# Patient Record
Sex: Female | Born: 1980 | Race: Black or African American | Hispanic: No | Marital: Married | Smoking: Never smoker
Health system: Southern US, Community
[De-identification: ages and names within clinical notes are randomized; demographics above are authoritative.]

## PROBLEM LIST (undated history)

## (undated) ENCOUNTER — Inpatient Hospital Stay (HOSPITAL_COMMUNITY): Payer: Self-pay

## (undated) DIAGNOSIS — O24419 Gestational diabetes mellitus in pregnancy, unspecified control: Secondary | ICD-10-CM

## (undated) DIAGNOSIS — R7302 Impaired glucose tolerance (oral): Secondary | ICD-10-CM

## (undated) DIAGNOSIS — I10 Essential (primary) hypertension: Secondary | ICD-10-CM

## (undated) HISTORY — DX: Essential (primary) hypertension: I10

## (undated) HISTORY — DX: Impaired glucose tolerance (oral): R73.02

## (undated) HISTORY — PX: REDUCTION MAMMAPLASTY: SUR839

## (undated) HISTORY — PX: BREAST REDUCTION SURGERY: SHX8

---

## 2009-11-05 ENCOUNTER — Ambulatory Visit: Payer: Self-pay | Admitting: Obstetrics and Gynecology

## 2009-11-05 LAB — CONVERTED CEMR LAB
Hemoglobin: 10.3 g/dL — ABNORMAL LOW (ref 12.0–15.0)
RBC: 3.61 M/uL — ABNORMAL LOW (ref 3.87–5.11)
WBC: 4.7 10*3/uL (ref 4.0–10.5)

## 2009-11-07 ENCOUNTER — Inpatient Hospital Stay (HOSPITAL_COMMUNITY): Admission: AD | Admit: 2009-11-07 | Discharge: 2009-11-07 | Payer: Self-pay | Admitting: Obstetrics and Gynecology

## 2009-11-07 ENCOUNTER — Encounter: Payer: Self-pay | Admitting: Family Medicine

## 2009-11-07 IMAGING — US US OB COMP +14 WK
1 series · 14 of 28 positions shown · non-contrast
Comparison: none

OBSTETRICAL ULTRASOUND:
 This ultrasound exam was performed in the [HOSPITAL] Ultrasound Department.  The OB US report was generated in the AS system, and faxed to the ordering physician.  This report is also available in [HOSPITAL]?s [employer] and in [REDACTED] PACS.

[Series 1: us ob comp +14 wk · 0.37mm/px · 14 of 39 slices shown]
[im 2/39]
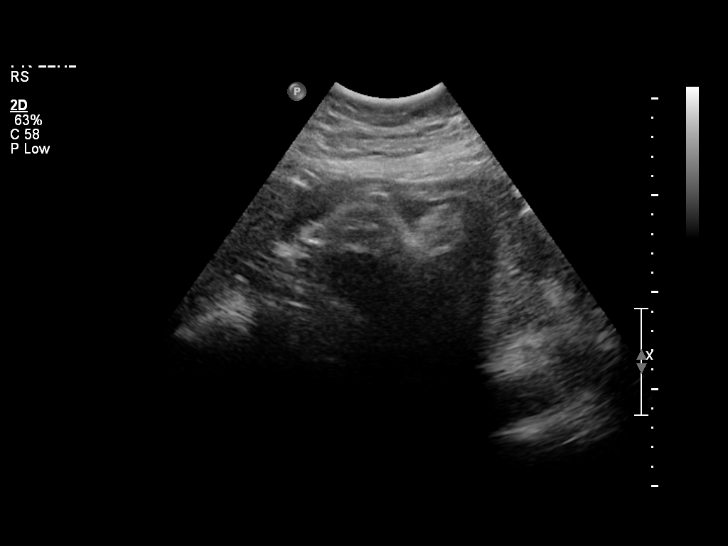
[im 5/39]
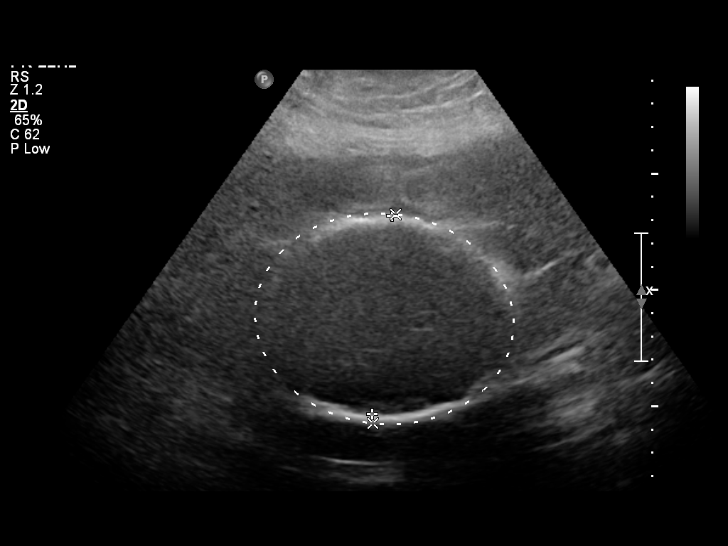
[im 8/39]
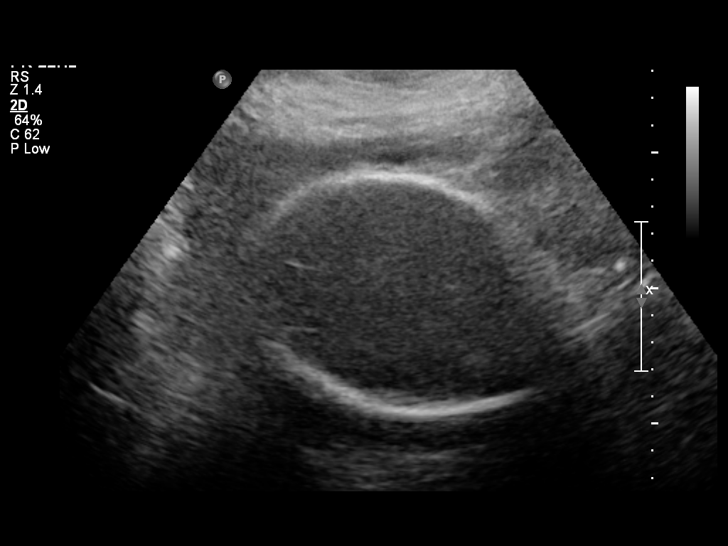
[im 10/39]
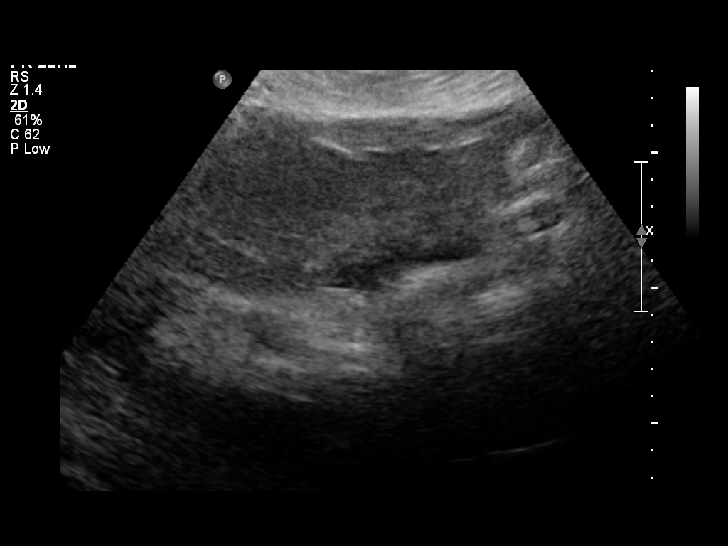
[im 13/39]
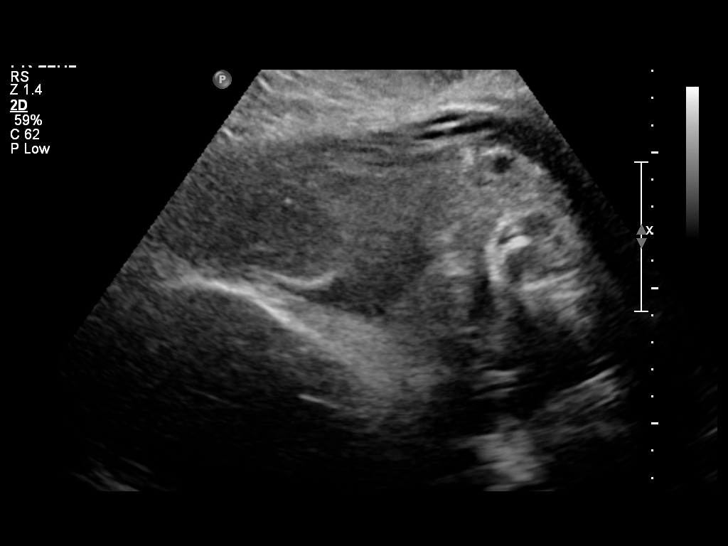
[im 16/39]
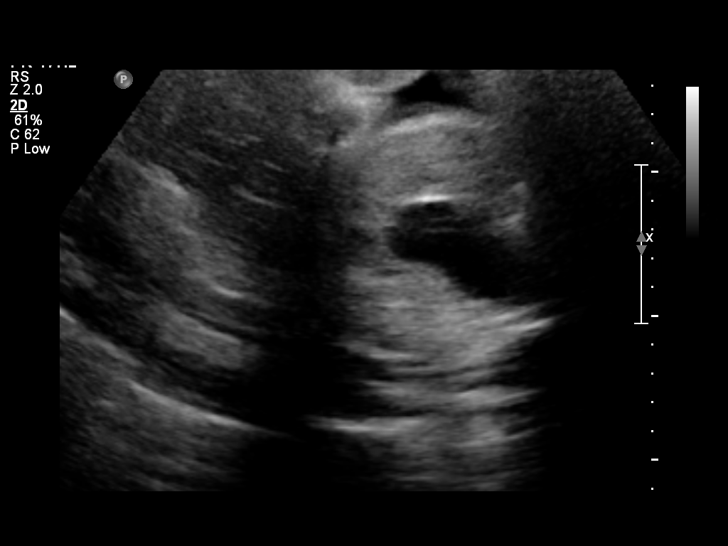
[im 19/39]
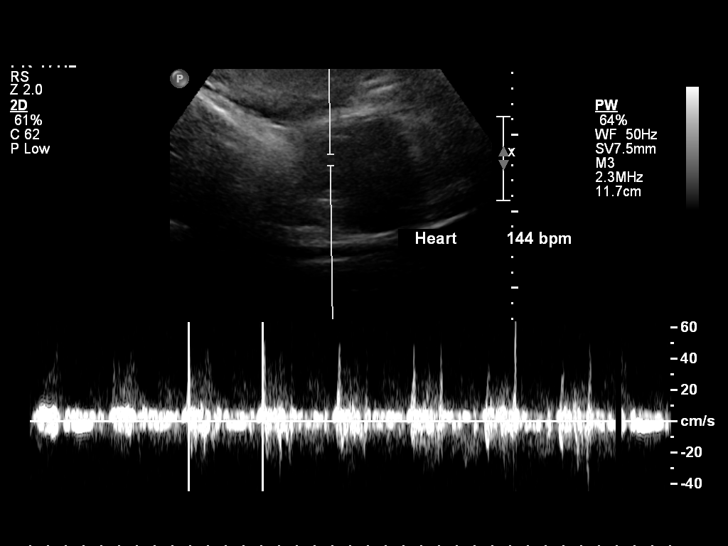
[im 22/39]
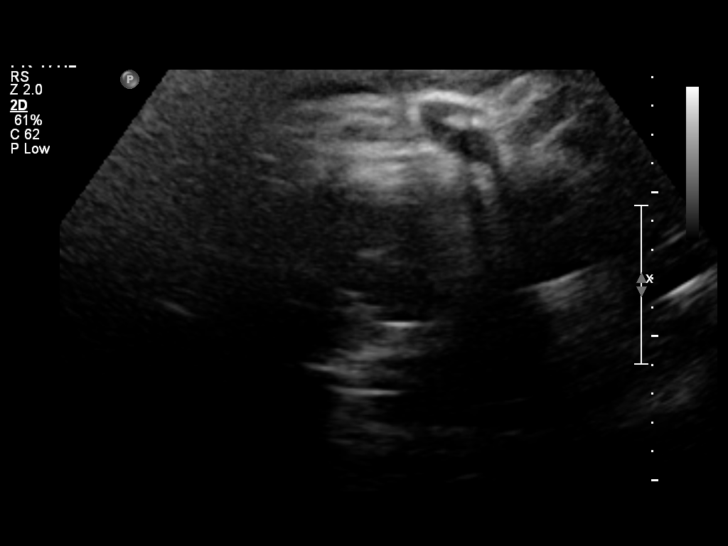
[im 24/39]
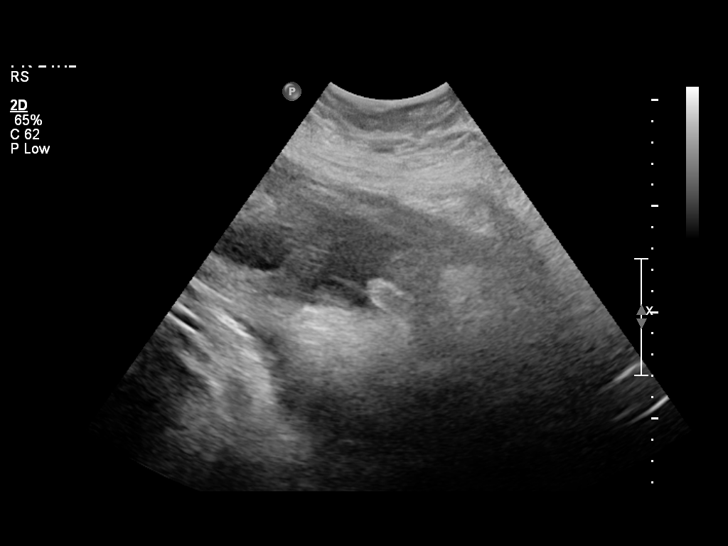
[im 27/39]
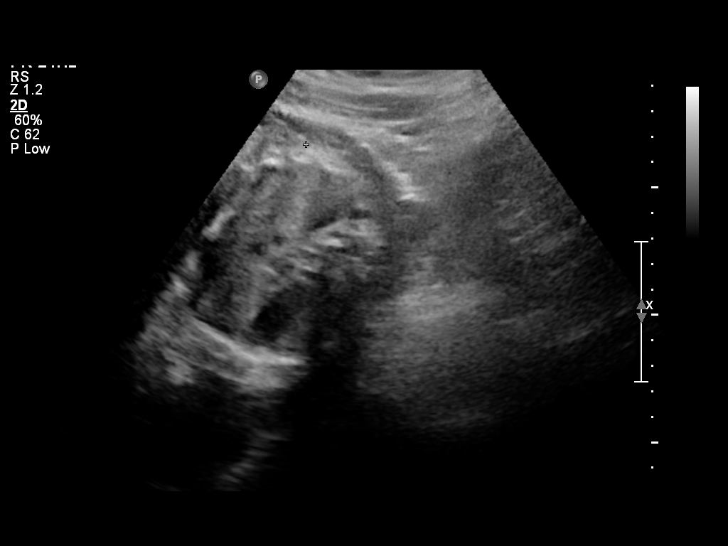
[im 30/39]
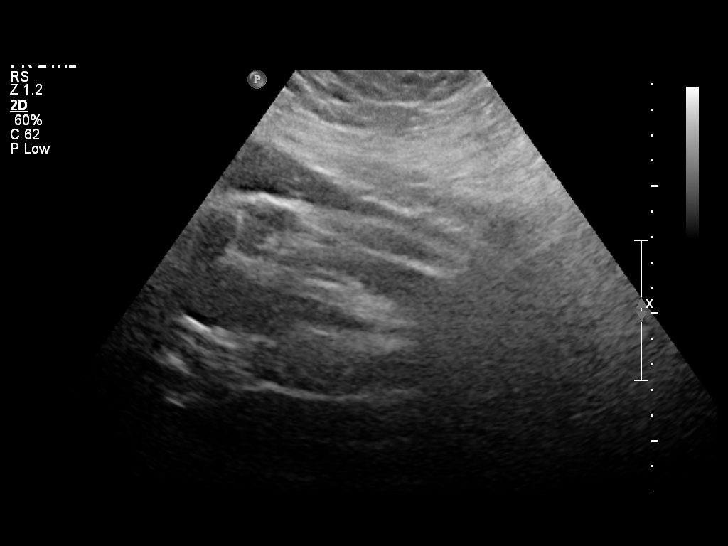
[im 33/39]
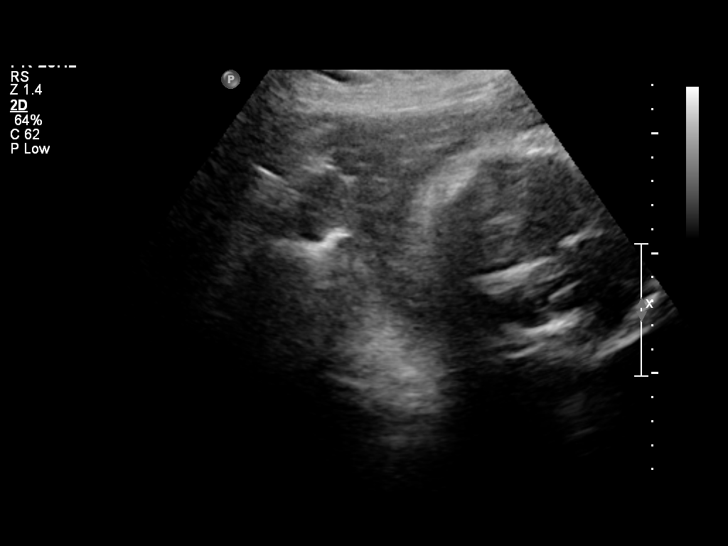
[im 36/39]
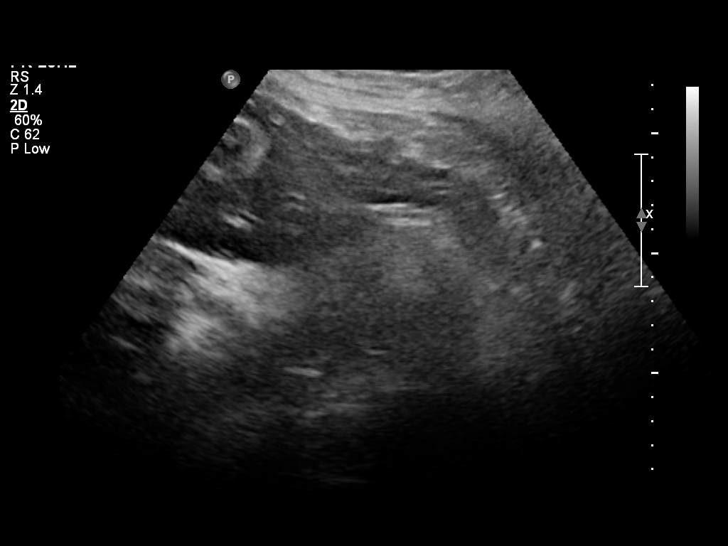
[im 39/39]
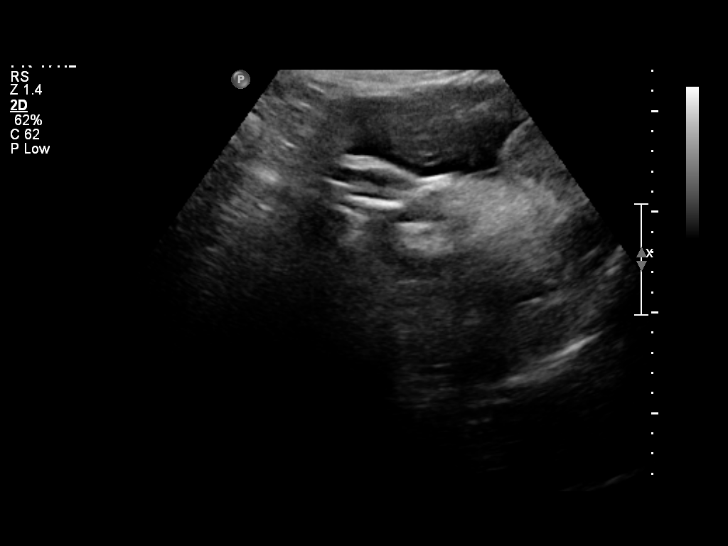

[14 of 28 positions shown; findings below may reference images not displayed]

IMPRESSION: See [facility] Obstetric US report.

## 2009-11-07 IMAGING — US US FETAL BPP W/O NONSTRESS
1 series · 4 of 4 positions shown · non-contrast
Comparison: none

OBSTETRICAL ULTRASOUND:
 This ultrasound exam was performed in the [HOSPITAL] Ultrasound Department.  The OB US report was generated in the AS system, and faxed to the ordering physician.  This report is also available in [HOSPITAL]?s [employer] and in [REDACTED] PACS.

[Series 1: us fetal bpp w/o nonstress · non-contrast · 4 acquisitions, 4 frames shown]
[im 1/4]
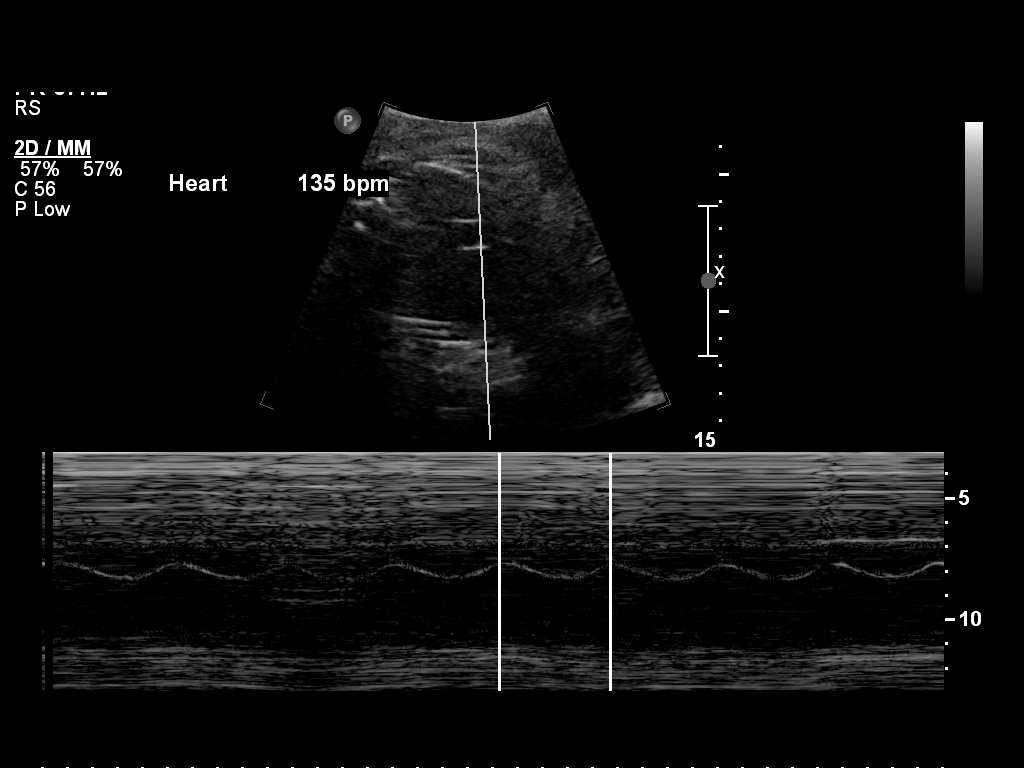
[im 2/4]
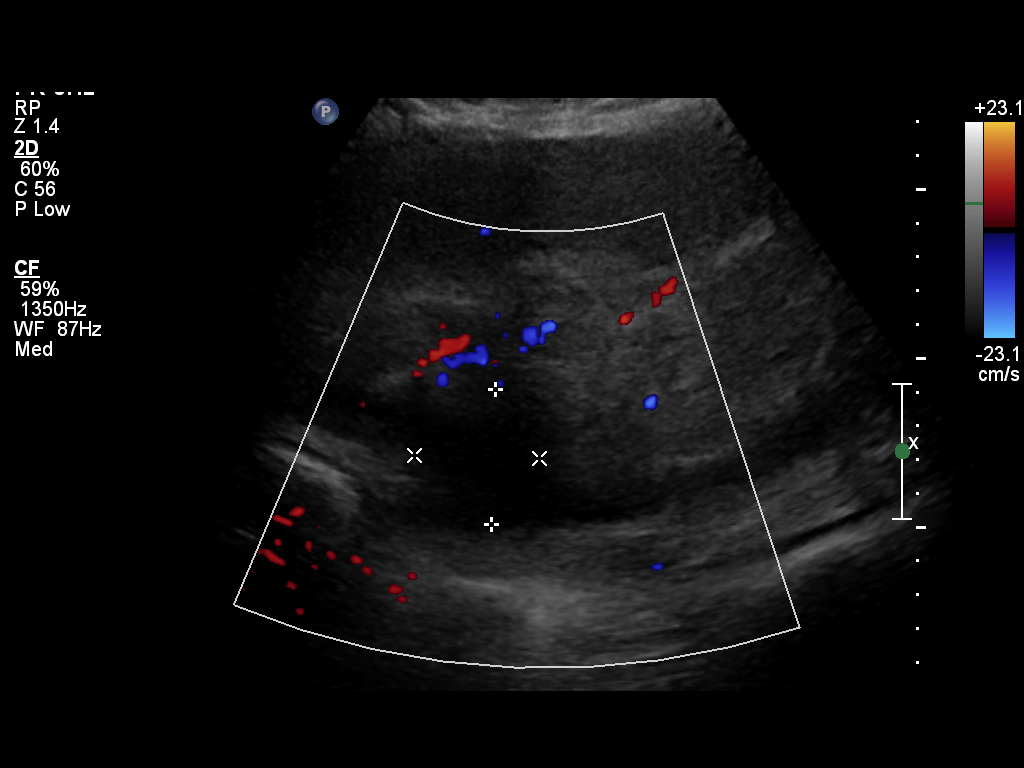
[im 3/4]
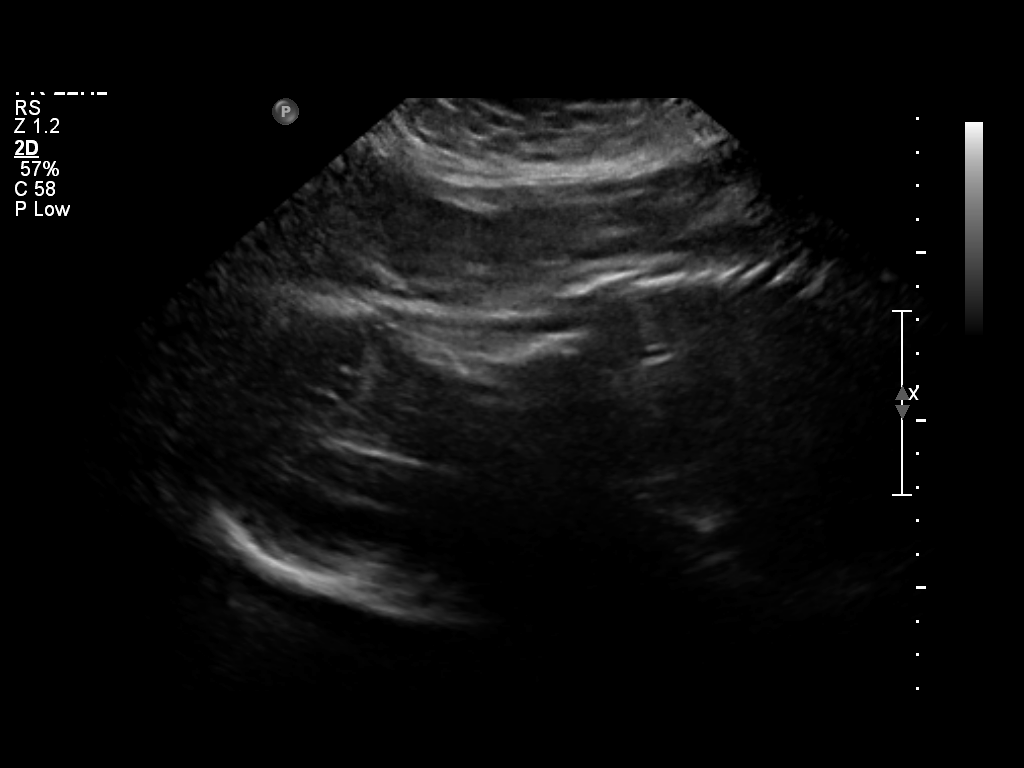
[im 4/4]
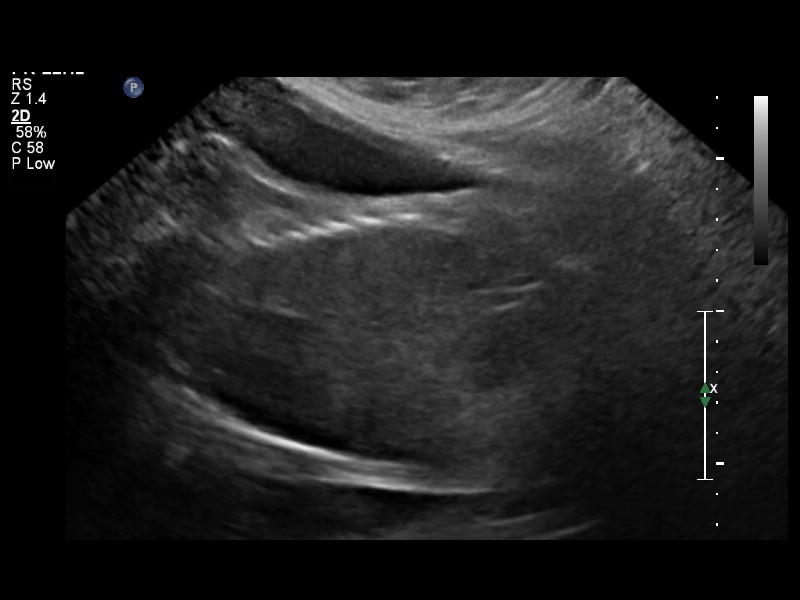

[4 of 4 positions shown; findings below may reference images not displayed]

IMPRESSION: See AS Obstetric US report.

## 2009-11-08 ENCOUNTER — Ambulatory Visit: Payer: Self-pay | Admitting: Advanced Practice Midwife

## 2009-11-08 ENCOUNTER — Encounter: Payer: Self-pay | Admitting: Obstetrics & Gynecology

## 2009-11-08 ENCOUNTER — Inpatient Hospital Stay (HOSPITAL_COMMUNITY): Admission: AD | Admit: 2009-11-08 | Discharge: 2009-11-09 | Payer: Self-pay | Admitting: Obstetrics and Gynecology

## 2009-11-08 LAB — CONVERTED CEMR LAB
Creatinine 24 HR UR: 1743 mg/24hr (ref 700–1800)
Creatinine Clearance: 247 mL/min — ABNORMAL HIGH (ref 75–115)

## 2009-11-10 ENCOUNTER — Ambulatory Visit: Payer: Self-pay | Admitting: Obstetrics & Gynecology

## 2009-11-13 ENCOUNTER — Inpatient Hospital Stay (HOSPITAL_COMMUNITY): Admission: AD | Admit: 2009-11-13 | Discharge: 2009-11-18 | Payer: Self-pay | Admitting: Obstetrics & Gynecology

## 2009-11-13 ENCOUNTER — Ambulatory Visit: Payer: Self-pay | Admitting: Obstetrics & Gynecology

## 2009-11-13 ENCOUNTER — Ambulatory Visit: Payer: Self-pay | Admitting: Obstetrics and Gynecology

## 2009-11-13 IMAGING — US US OB LIMITED
1 series · 14 of 20 positions shown · non-contrast
Comparison: none

OBSTETRICAL ULTRASOUND:
 This ultrasound exam was performed in the [HOSPITAL] Ultrasound Department.  The OB US report was generated in the AS system, and faxed to the ordering physician.  This report is also available in [HOSPITAL]?s [employer] and in [REDACTED] PACS.

[Series 1: us fetal bpp w/o nonstress · non-contrast · 0.33mm/px · 20 acquisitions, 14 frames shown]
[im 1/20]
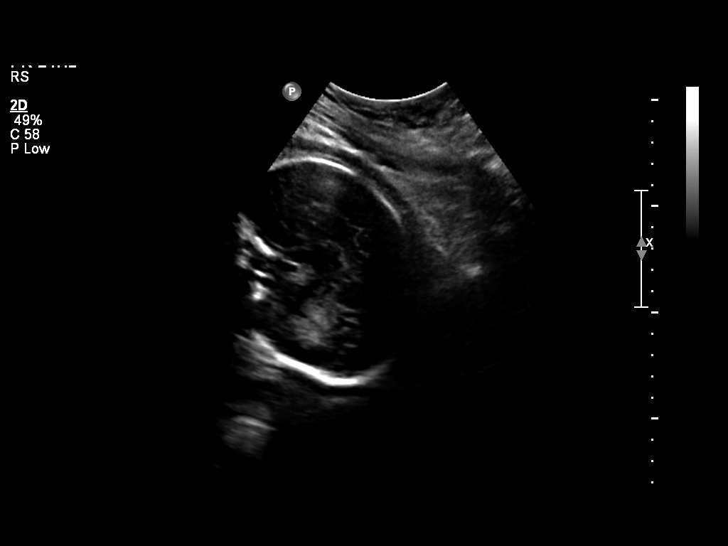
[im 3/20]
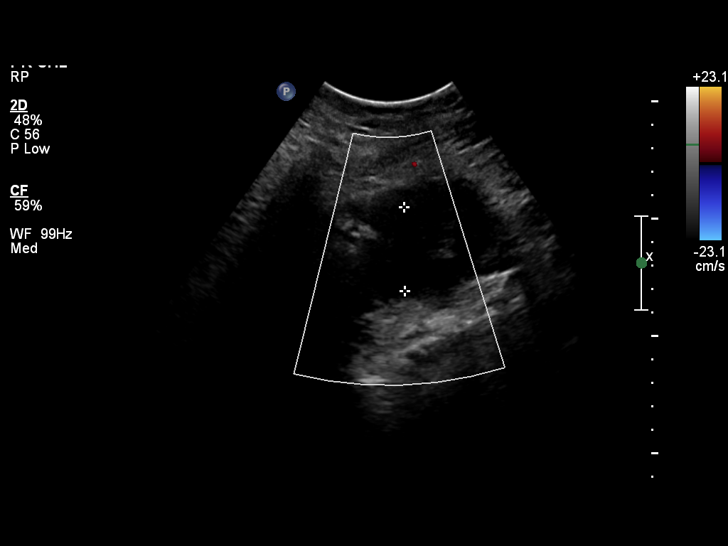
[im 4/20]
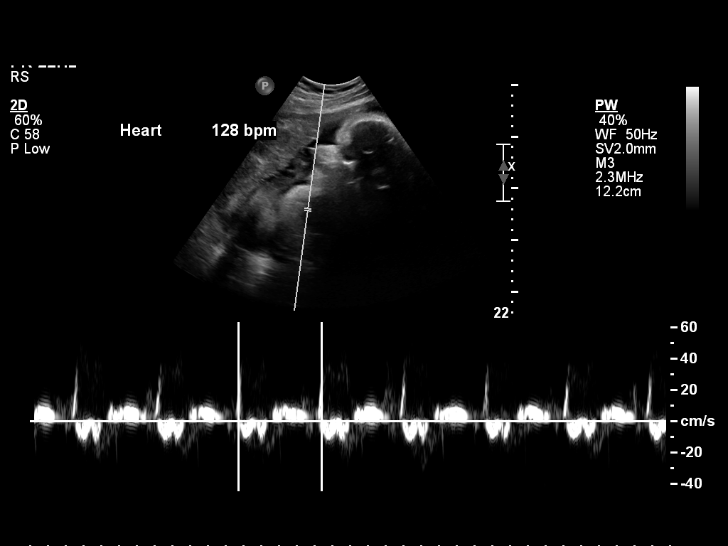
[im 6/20]
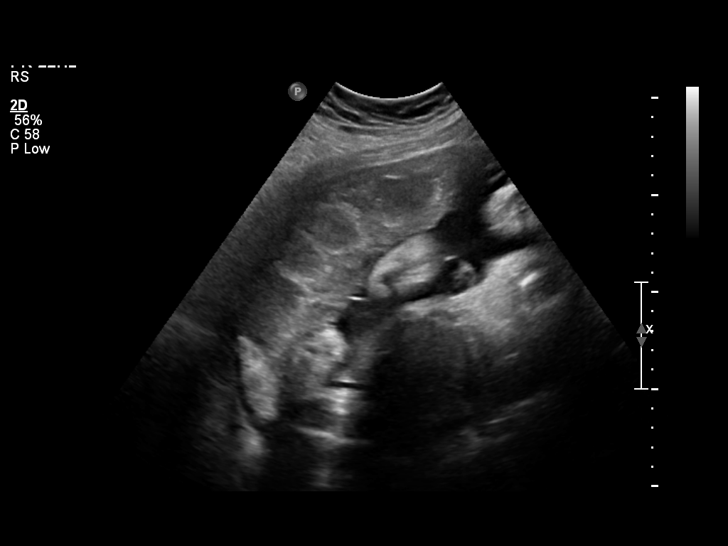
[im 7/20]
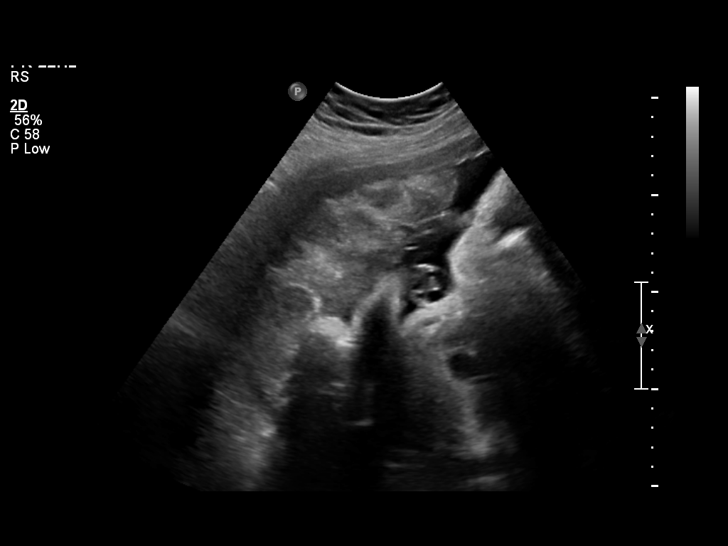
[im 8/20]
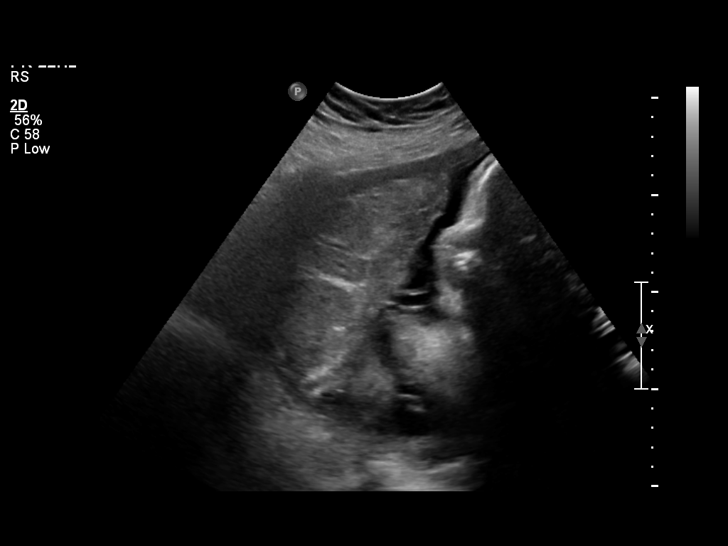
[im 10/20]
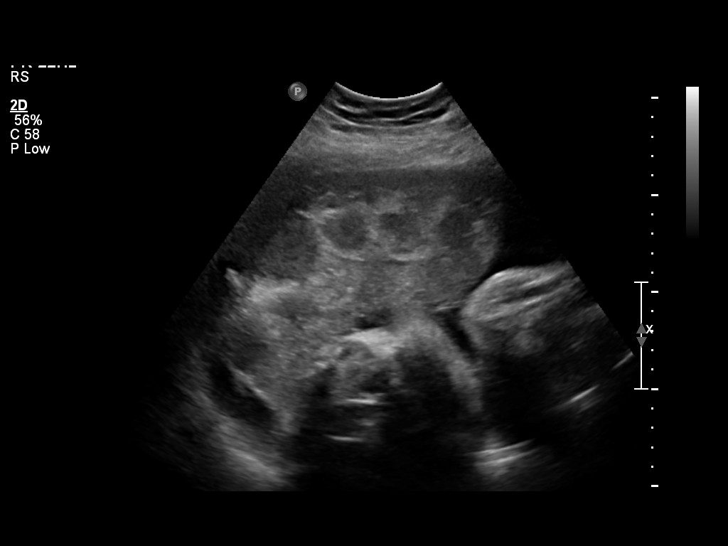
[im 11/20]
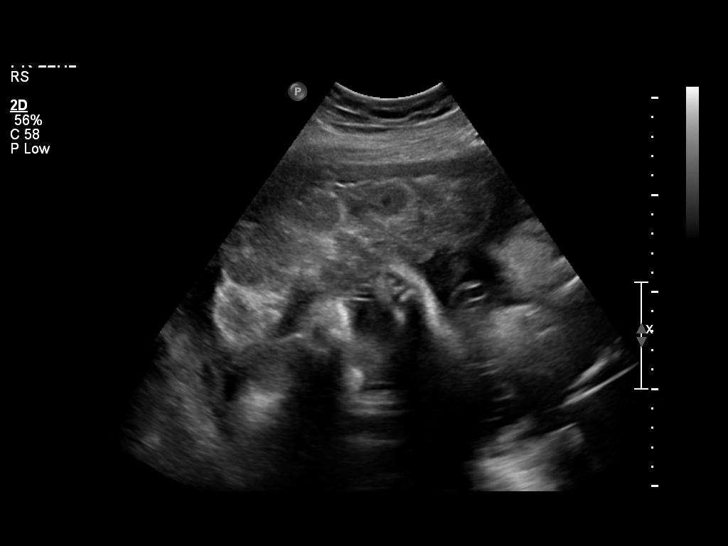
[im 13/20]
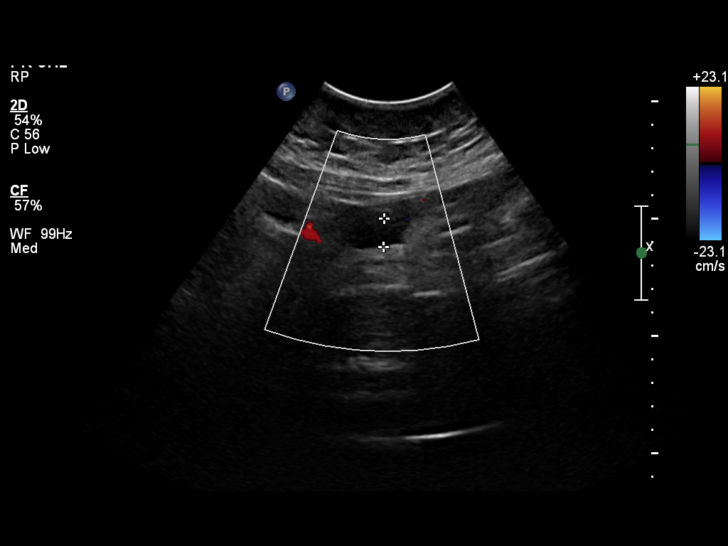
[im 14/20]
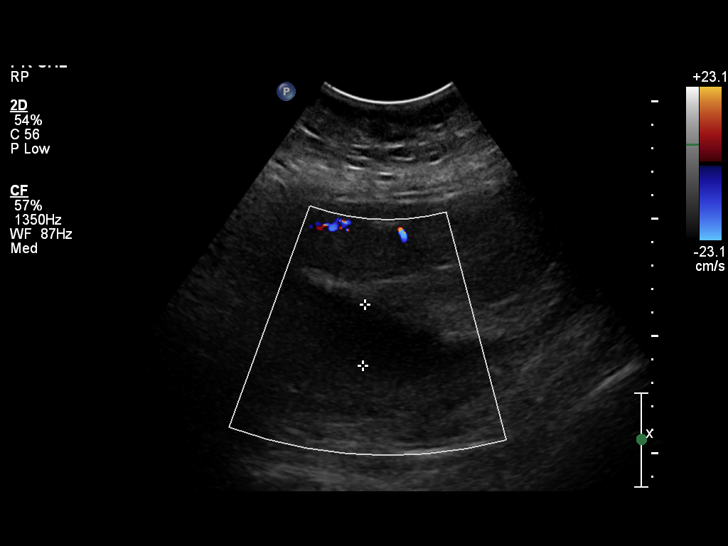
[im 16/20]
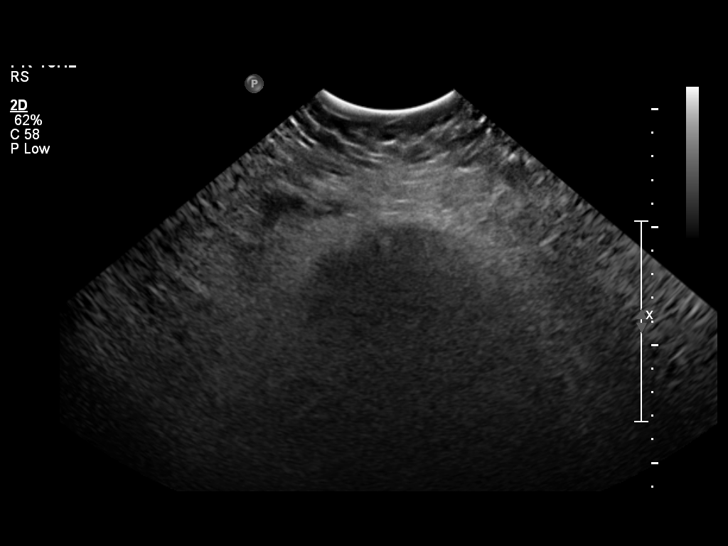
[im 17/20]
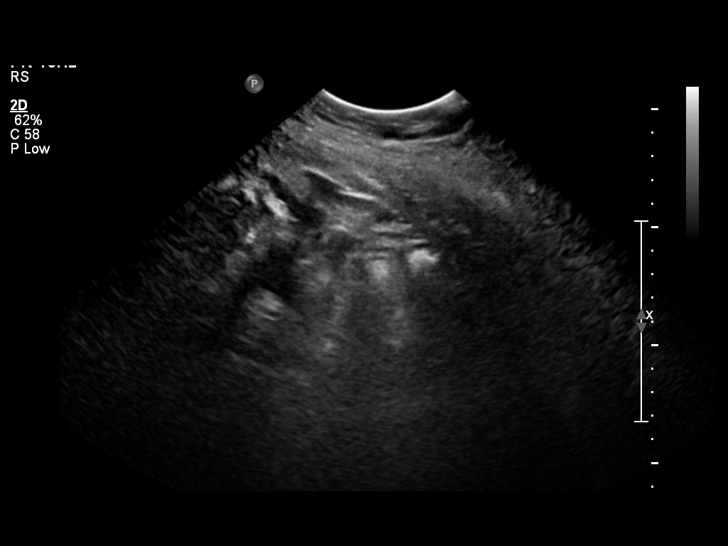
[im 18/20]
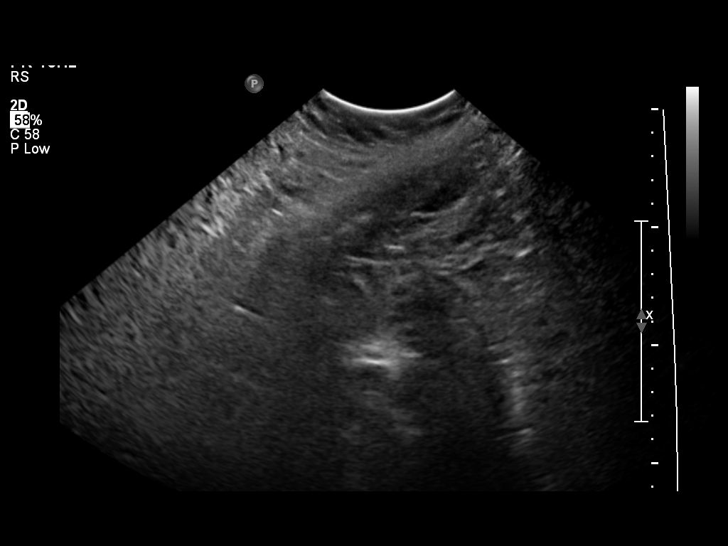
[im 20/20]
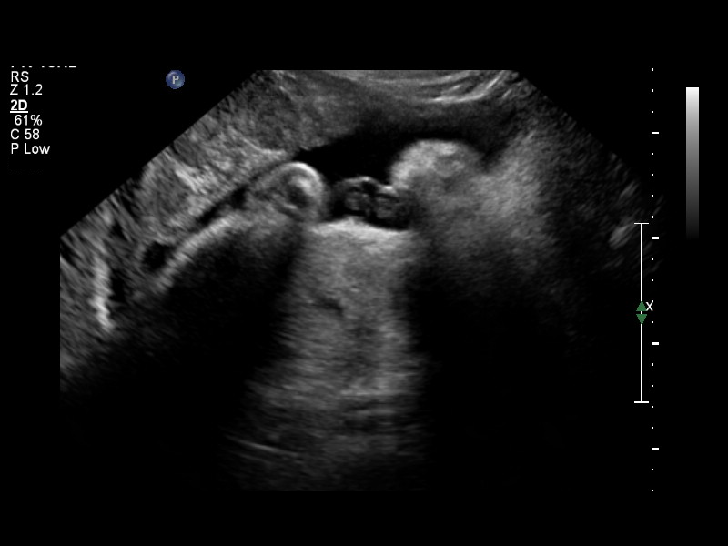

[14 of 20 positions shown; findings below may reference images not displayed]

IMPRESSION: See AS Obstetric US report.

## 2009-11-15 ENCOUNTER — Encounter: Payer: Self-pay | Admitting: Obstetrics & Gynecology

## 2009-11-15 DIAGNOSIS — O1413 Severe pre-eclampsia, third trimester: Secondary | ICD-10-CM

## 2009-11-19 ENCOUNTER — Inpatient Hospital Stay (HOSPITAL_COMMUNITY): Admission: AD | Admit: 2009-11-19 | Discharge: 2009-11-19 | Payer: Self-pay | Admitting: Obstetrics and Gynecology

## 2009-11-19 ENCOUNTER — Ambulatory Visit: Payer: Self-pay | Admitting: Obstetrics and Gynecology

## 2009-11-19 IMAGING — CR DG CHEST 2V
2 series · 2 of 2 positions shown · non-contrast
Comparison: No similar prior study is available for comparison.

CLINICAL DATA: Postpartum status post C-section 11/15/2009.
Shortness of breath, epigastric pain and cough

CHEST - 2 VIEW

[view not recorded (1 of 2)]
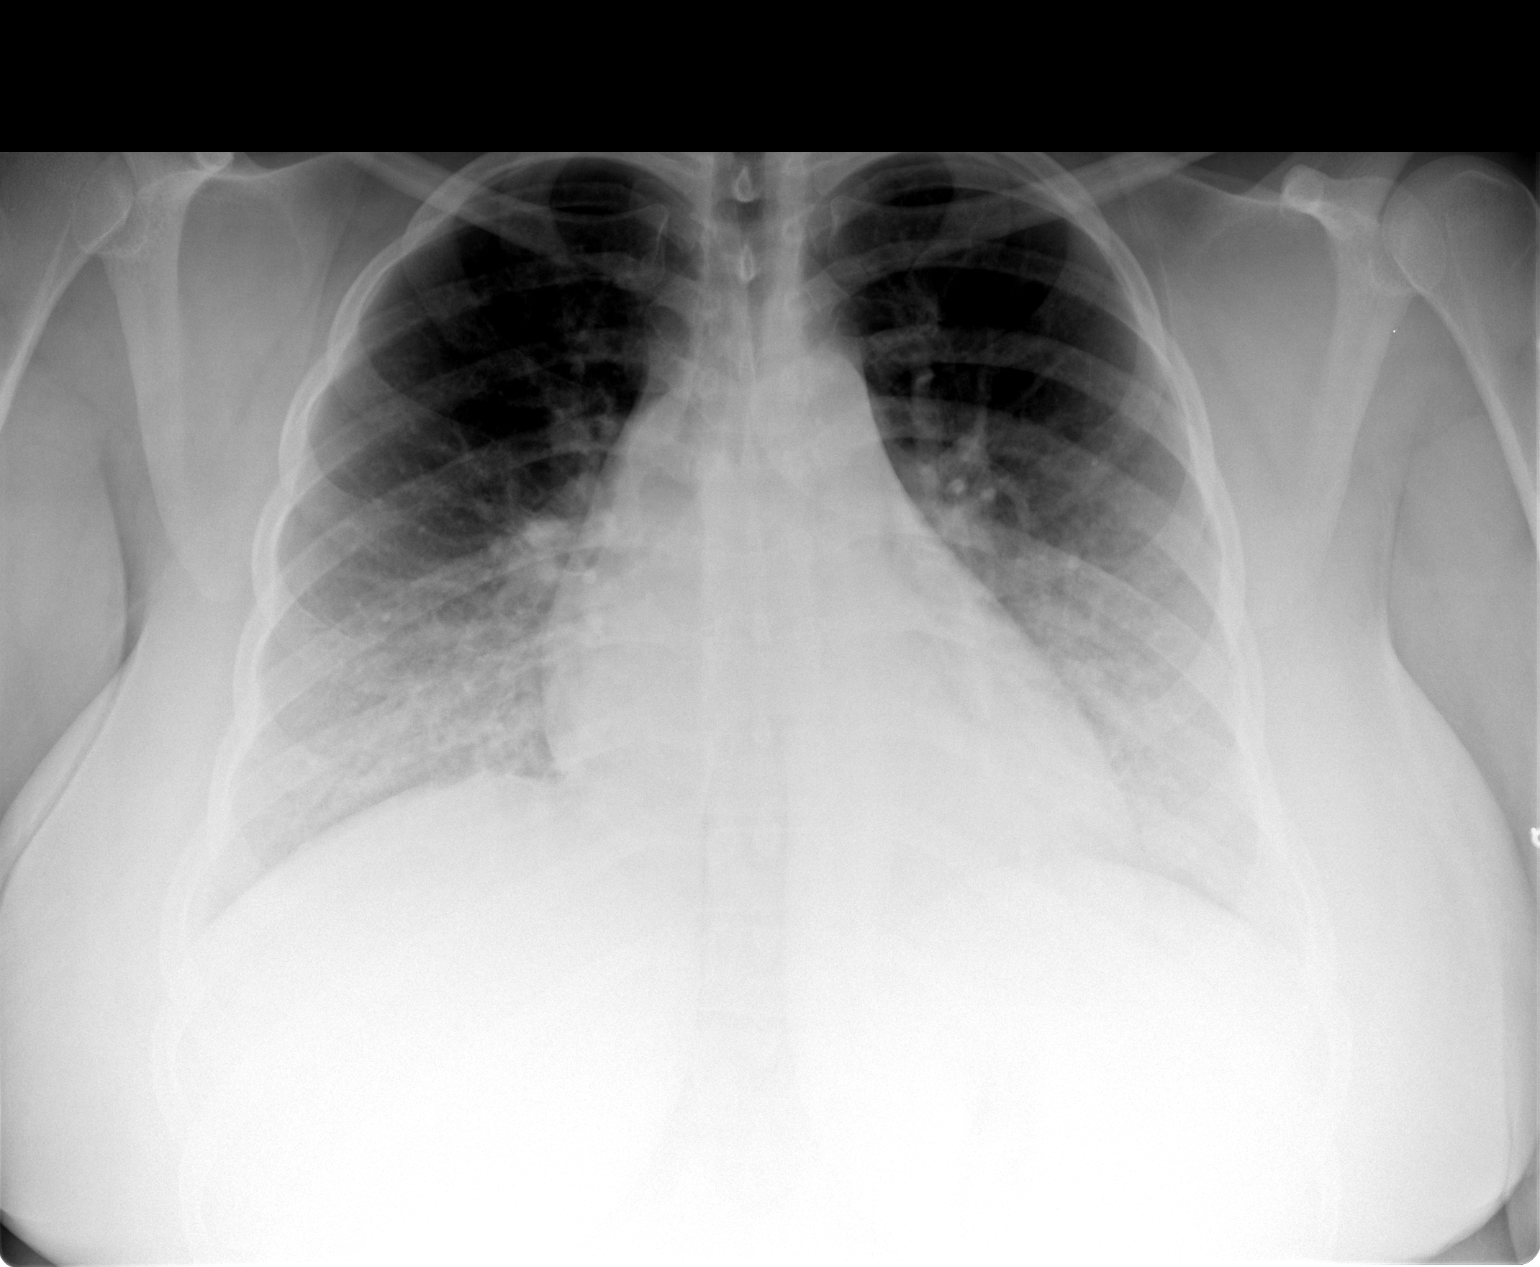

[view not recorded (2 of 2)]
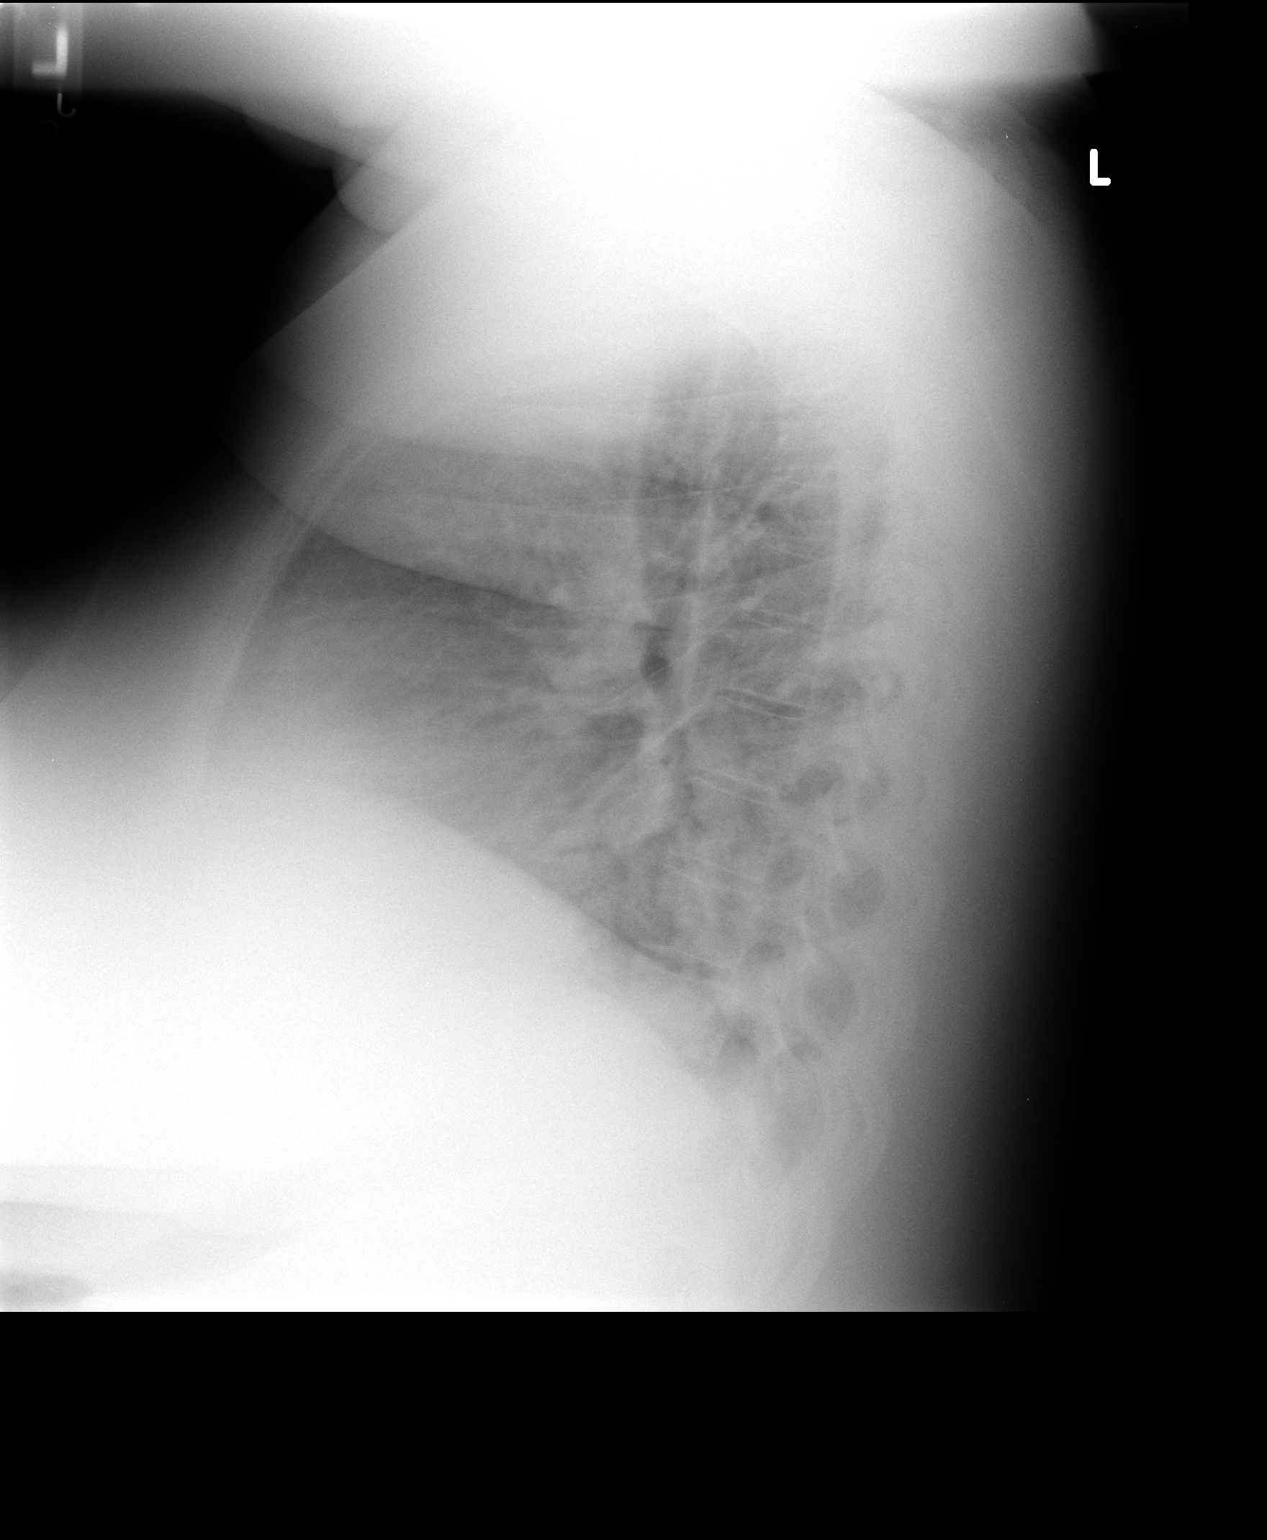

[2 of 2 positions shown; findings below may reference images not displayed]

FINDINGS: Mild prominence of the cardiac silhouette is noted with
central vascular congestion but no overt edema.  Lungs are
otherwise clear.  No pleural effusion.
IMPRESSION: Mild prominence of the cardiomediastinal silhouette with central
vascular congestion.

## 2010-10-04 ENCOUNTER — Encounter: Payer: Self-pay | Admitting: Obstetrics and Gynecology

## 2010-12-04 LAB — POCT URINALYSIS DIP (DEVICE)
Bilirubin Urine: NEGATIVE
Glucose, UA: NEGATIVE mg/dL
Glucose, UA: NEGATIVE mg/dL
Hgb urine dipstick: NEGATIVE
Ketones, ur: NEGATIVE mg/dL
Specific Gravity, Urine: 1.01 (ref 1.005–1.030)
pH: 6 (ref 5.0–8.0)

## 2010-12-04 LAB — PROTIME-INR
INR: 0.96 (ref 0.00–1.49)
Prothrombin Time: 12.7 seconds (ref 11.6–15.2)

## 2010-12-04 LAB — CBC
MCHC: 33.2 g/dL (ref 30.0–36.0)
MCV: 90.7 fL (ref 78.0–100.0)
RDW: 15 % (ref 11.5–15.5)

## 2010-12-04 LAB — COMPREHENSIVE METABOLIC PANEL
AST: 15 U/L (ref 0–37)
BUN: 2 mg/dL — ABNORMAL LOW (ref 6–23)
Creatinine, Ser: 0.49 mg/dL (ref 0.4–1.2)
GFR calc Af Amer: >60 mL/min (ref >60)
Potassium: 3.5 mEq/L (ref 3.5–5.1)
Total Bilirubin: 0.3 mg/dL (ref 0.3–1.2)
Total Protein: 5.9 g/dL — ABNORMAL LOW (ref 6.0–8.3)

## 2010-12-04 LAB — STREP B DNA PROBE

## 2010-12-04 LAB — URINALYSIS, ROUTINE W REFLEX MICROSCOPIC
Bilirubin Urine: NEGATIVE
Hgb urine dipstick: NEGATIVE
Nitrite: NEGATIVE
Specific Gravity, Urine: <1.005 — ABNORMAL LOW (ref 1.005–1.030)
pH: 7 (ref 5.0–8.0)

## 2010-12-04 LAB — APTT: aPTT: 26 seconds (ref 24–37)

## 2010-12-04 LAB — WET PREP, GENITAL
Clue Cells Wet Prep HPF POC: NONE SEEN
Trich, Wet Prep: NONE SEEN

## 2010-12-04 LAB — URIC ACID: Uric Acid, Serum: 4 mg/dL (ref 2.4–7.0)

## 2010-12-04 LAB — URINE CULTURE

## 2010-12-04 LAB — FETAL FIBRONECTIN: Fetal Fibronectin: NEGATIVE

## 2010-12-04 LAB — GC/CHLAMYDIA PROBE AMP, GENITAL: Chlamydia, DNA Probe: NEGATIVE

## 2010-12-07 LAB — CBC
HCT: 28.5 % — ABNORMAL LOW (ref 36.0–46.0)
HCT: 34.7 % — ABNORMAL LOW (ref 36.0–46.0)
Hemoglobin: 11.5 g/dL — ABNORMAL LOW (ref 12.0–15.0)
Hemoglobin: 9.5 g/dL — ABNORMAL LOW (ref 12.0–15.0)
Hemoglobin: 9.9 g/dL — ABNORMAL LOW (ref 12.0–15.0)
MCHC: 33.2 g/dL (ref 30.0–36.0)
MCHC: 33.3 g/dL (ref 30.0–36.0)
MCV: 90.8 fL (ref 78.0–100.0)
MCV: 91.2 fL (ref 78.0–100.0)
RBC: 3.13 MIL/uL — ABNORMAL LOW (ref 3.87–5.11)
RBC: 3.21 MIL/uL — ABNORMAL LOW (ref 3.87–5.11)
RBC: 3.3 MIL/uL — ABNORMAL LOW (ref 3.87–5.11)
RBC: 3.83 MIL/uL — ABNORMAL LOW (ref 3.87–5.11)
RDW: 15.1 % (ref 11.5–15.5)
RDW: 15.3 % (ref 11.5–15.5)
WBC: 7.5 10*3/uL (ref 4.0–10.5)

## 2010-12-07 LAB — COMPREHENSIVE METABOLIC PANEL
ALT: 10 U/L (ref 0–35)
ALT: 11 U/L (ref 0–35)
AST: 12 U/L (ref 0–37)
Alkaline Phosphatase: 124 U/L — ABNORMAL HIGH (ref 39–117)
Calcium: 8.3 mg/dL — ABNORMAL LOW (ref 8.4–10.5)
Chloride: 106 mEq/L (ref 96–112)
Chloride: 108 mEq/L (ref 96–112)
GFR calc Af Amer: >60 mL/min (ref >60)
GFR calc Af Amer: >60 mL/min (ref >60)
GFR calc non Af Amer: >60 mL/min (ref >60)
Glucose, Bld: 69 mg/dL — ABNORMAL LOW (ref 70–99)
Glucose, Bld: 71 mg/dL (ref 70–99)
Potassium: 3.2 mEq/L — ABNORMAL LOW (ref 3.5–5.1)
Potassium: 3.7 mEq/L (ref 3.5–5.1)
Sodium: 136 mEq/L (ref 135–145)
Sodium: 136 mEq/L (ref 135–145)
Sodium: 136 mEq/L (ref 135–145)
Total Bilirubin: 0.4 mg/dL (ref 0.3–1.2)
Total Bilirubin: 0.6 mg/dL (ref 0.3–1.2)
Total Protein: 4.9 g/dL — ABNORMAL LOW (ref 6.0–8.3)
Total Protein: 5.5 g/dL — ABNORMAL LOW (ref 6.0–8.3)

## 2010-12-07 LAB — PROTEIN, URINE, 24 HOUR
Collection Interval-UPROT: 24 hours
Protein, 24H Urine: 5418 mg/d — ABNORMAL HIGH (ref 50–100)
Protein, Urine: 516 mg/dL

## 2010-12-07 LAB — CREATININE CLEARANCE, URINE, 24 HOUR
Creatinine Clearance: 217 mL/min — ABNORMAL HIGH (ref 75–115)
Creatinine, 24H Ur: 1468 mg/d (ref 700–1800)
Urine Total Volume-CRCL: 1050 mL

## 2010-12-09 ENCOUNTER — Emergency Department (HOSPITAL_COMMUNITY)
Admission: EM | Admit: 2010-12-09 | Discharge: 2010-12-09 | Disposition: A | Discharge status: Home / Self Care | Payer: Medicaid Other | Attending: Emergency Medicine | Admitting: Emergency Medicine

## 2010-12-09 DIAGNOSIS — N898 Other specified noninflammatory disorders of vagina: Secondary | ICD-10-CM | POA: Insufficient documentation

## 2010-12-09 DIAGNOSIS — R5381 Other malaise: Secondary | ICD-10-CM | POA: Insufficient documentation

## 2010-12-16 ENCOUNTER — Inpatient Hospital Stay (HOSPITAL_COMMUNITY)
Admission: AD | Admit: 2010-12-16 | Discharge: 2010-12-16 | Disposition: A | Discharge status: Home / Self Care | Payer: Medicaid Other | Source: Ambulatory Visit | Attending: Obstetrics & Gynecology | Admitting: Obstetrics & Gynecology

## 2010-12-16 ENCOUNTER — Inpatient Hospital Stay (HOSPITAL_COMMUNITY): Payer: Medicaid Other

## 2010-12-16 DIAGNOSIS — D649 Anemia, unspecified: Secondary | ICD-10-CM | POA: Insufficient documentation

## 2010-12-16 DIAGNOSIS — N949 Unspecified condition associated with female genital organs and menstrual cycle: Secondary | ICD-10-CM

## 2010-12-16 DIAGNOSIS — N938 Other specified abnormal uterine and vaginal bleeding: Secondary | ICD-10-CM | POA: Insufficient documentation

## 2010-12-16 LAB — WET PREP, GENITAL: Clue Cells Wet Prep HPF POC: NONE SEEN

## 2010-12-16 LAB — CBC
HCT: 27.5 % — ABNORMAL LOW (ref 36.0–46.0)
Hemoglobin: 8.6 g/dL — ABNORMAL LOW (ref 12.0–15.0)
MCV: 85.4 fL (ref 78.0–100.0)
Platelets: 295 10*3/uL (ref 150–400)
RBC: 3.22 MIL/uL — ABNORMAL LOW (ref 3.87–5.11)
WBC: 6.5 10*3/uL (ref 4.0–10.5)

## 2010-12-16 IMAGING — US US PELVIS COMPLETE
1 series · 14 of 25 positions shown · non-contrast
Comparison: Prior OB ultrasounds 8022

CLINICAL DATA: Pelvic pain and vaginal bleeding on.  Prior c-
section.  History of vesicovaginal fistula.



[Series 1: us pelvis complete · 14 of 55 slices shown]
[im 1/55]
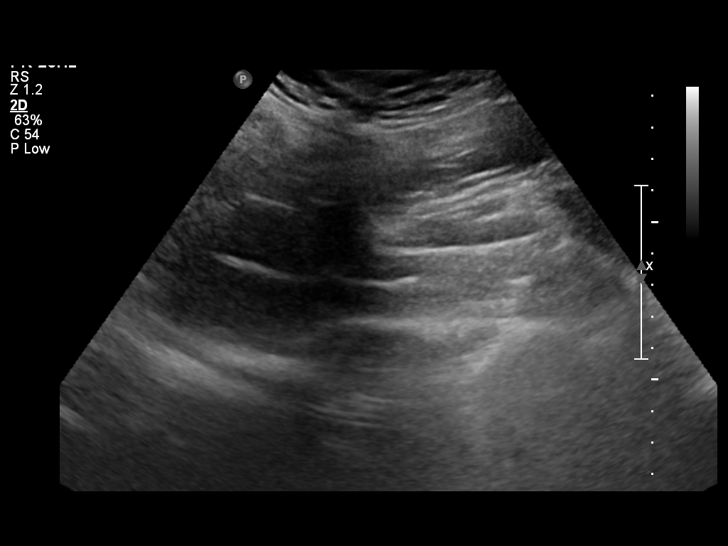
[im 5/55]
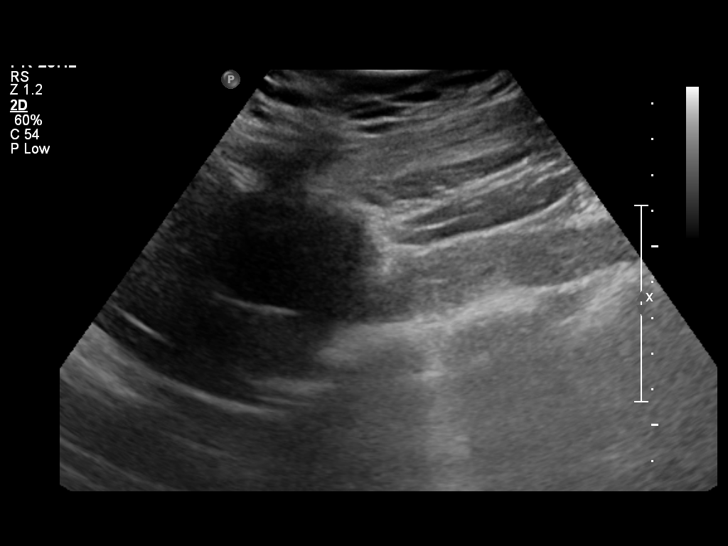
[im 10/55]
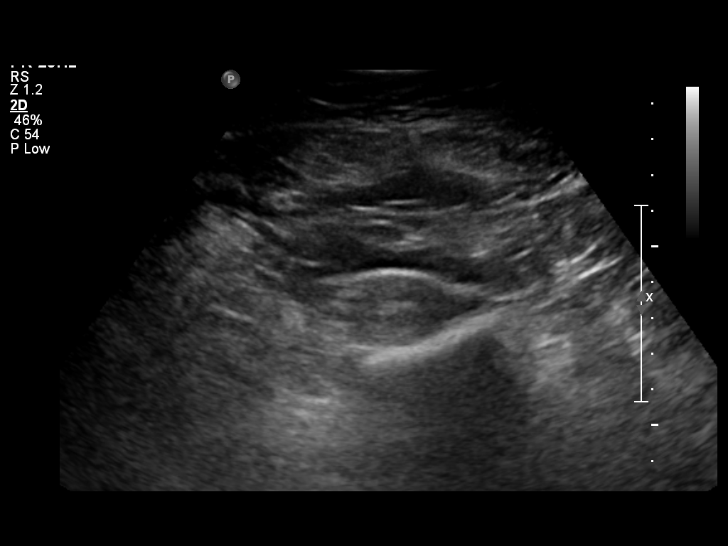
[im 14/55]
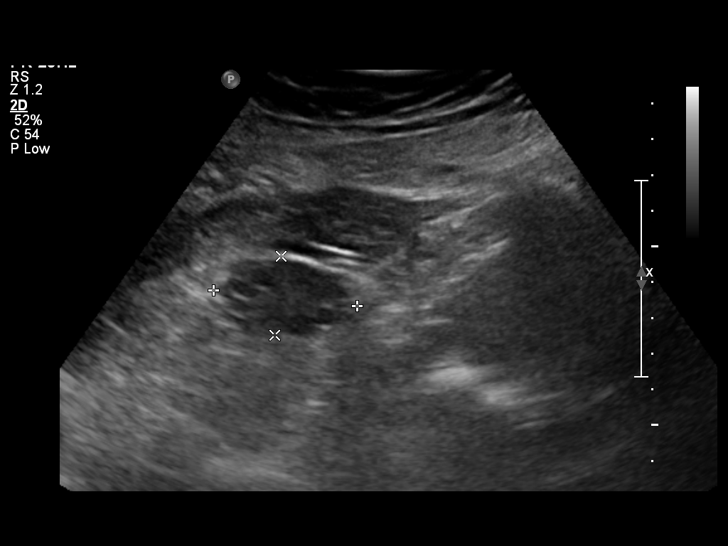
[im 19/55]
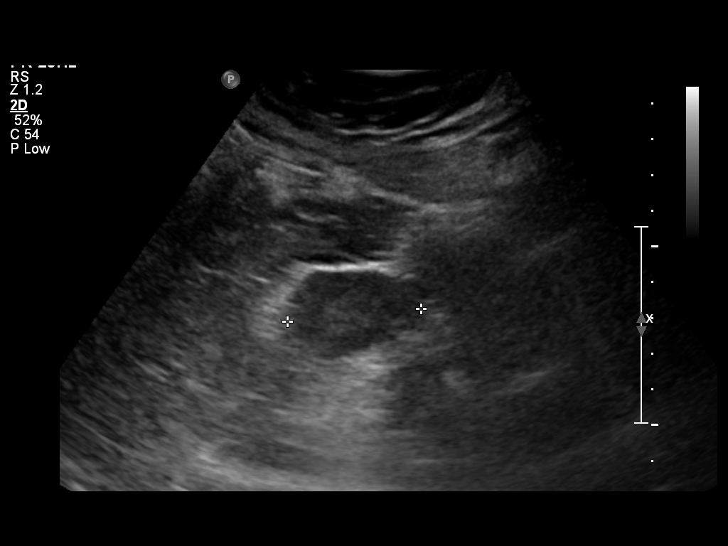
[im 21/55]
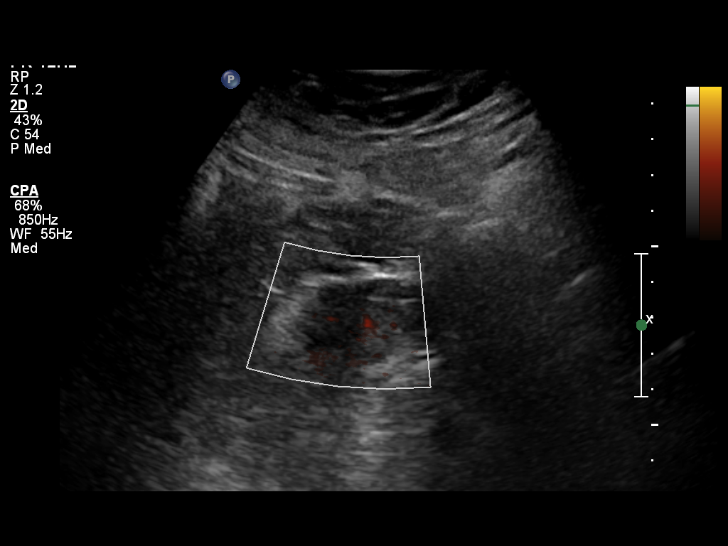
[im 25/55]
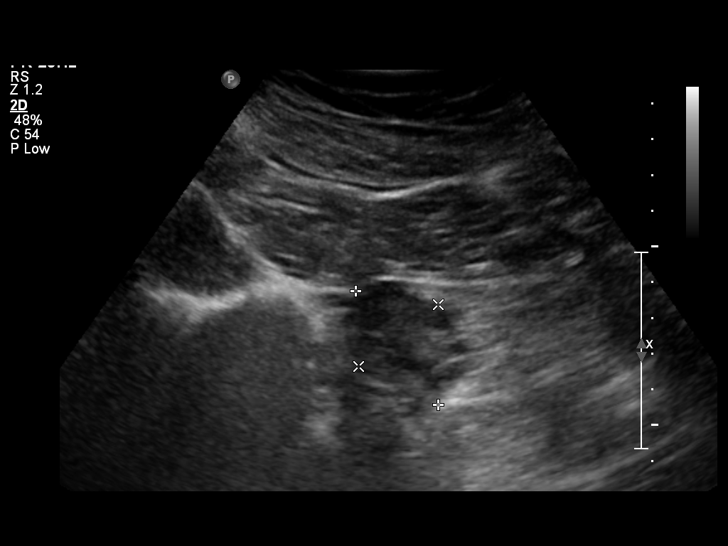
[im 30/55]
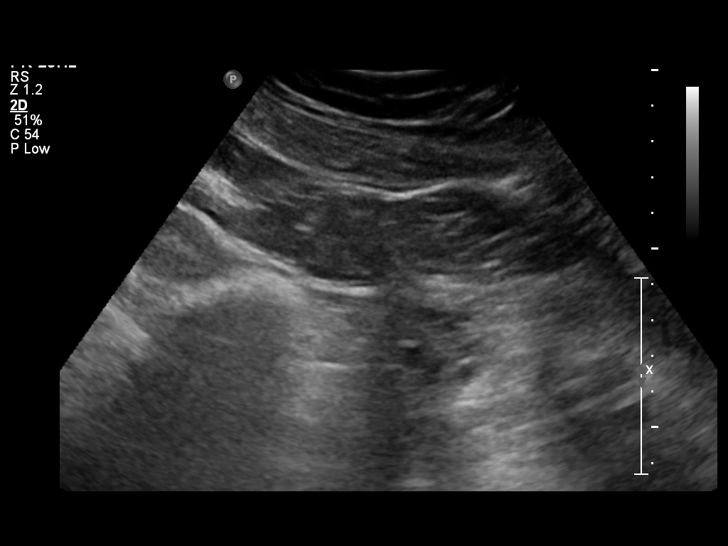
[im 34/55]
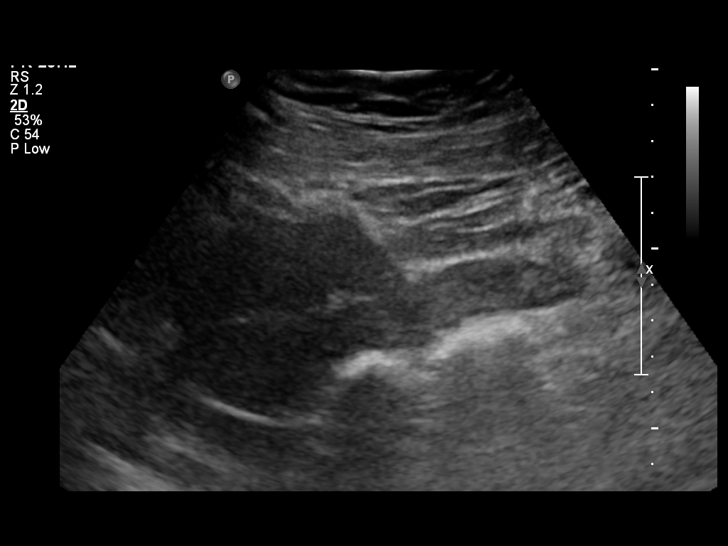
[im 37/55]
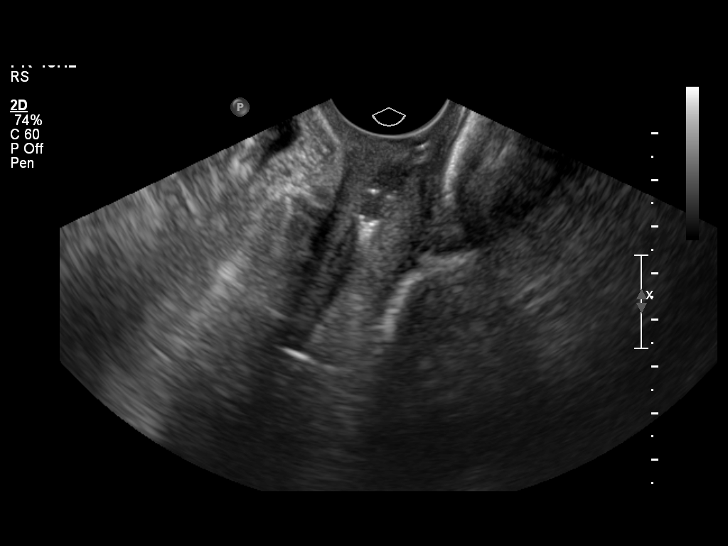
[im 41/55]
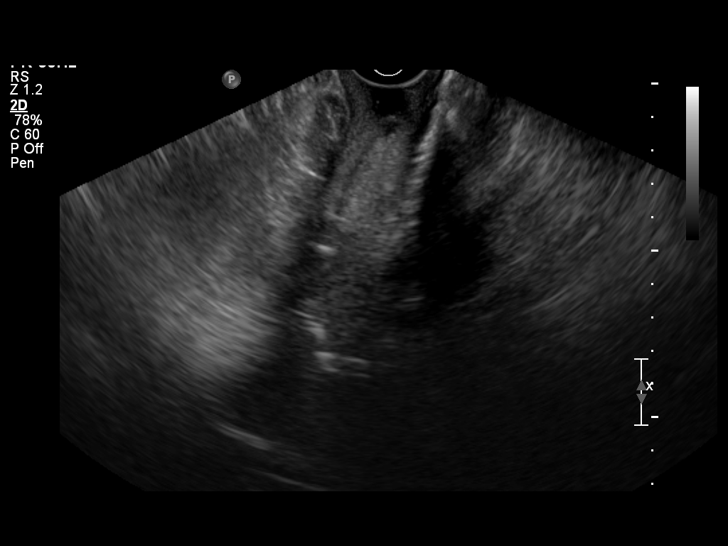
[im 46/55]
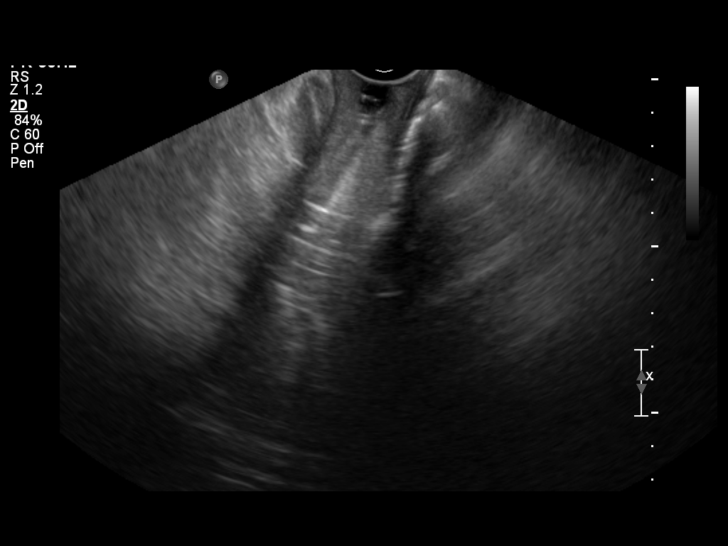
[im 50/55]
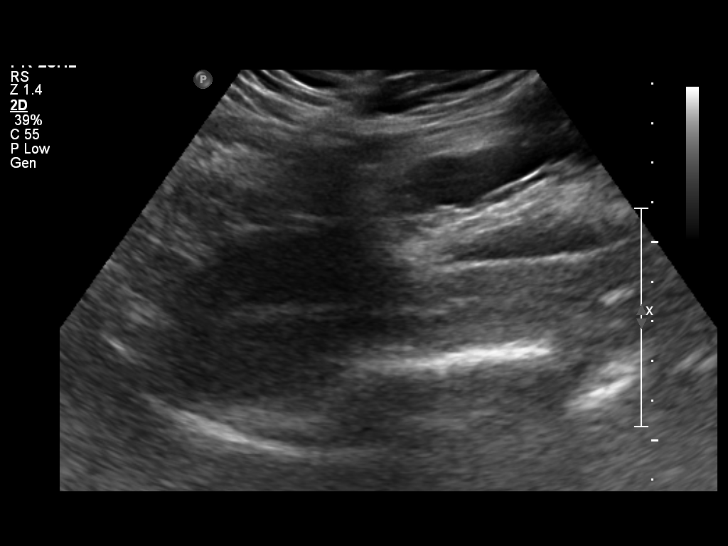
[im 55/55]
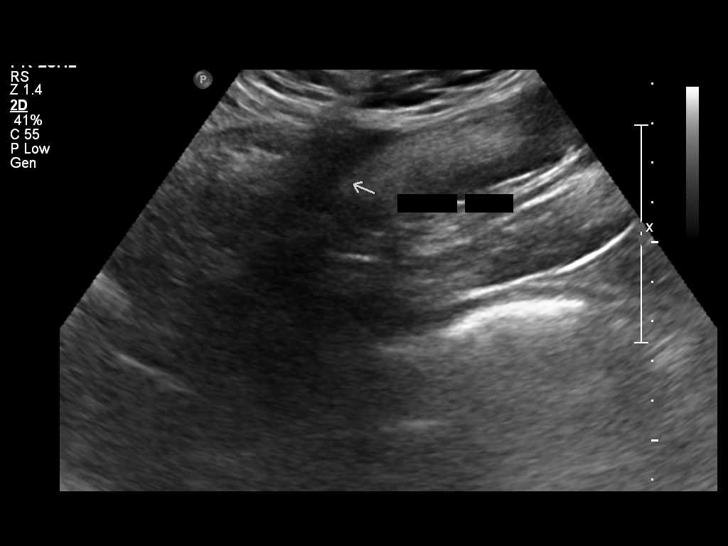

[14 of 25 positions shown; findings below may reference images not displayed]

FINDINGS: Uterus:  13.4 x 5.4 x 4.3 cm.  Retroverted, retroflexed.  This
renders visualization of the fundus suboptimal.  No focal
abnormality.

Endometrium:  6 mm.  Uniformly echogenic.  Not well visualized at
the fundus or midportion due to retroflexion.

Right ovary:  4.0 x 3.8 x 2.2 cm.  Normal in appearance, seen only
transabdominally.

Left ovary:  3.9 x 2.8 x 2.5 cm.  Normal in appearance, seen only
transabdominally

Other findings:  No free fluid.
IMPRESSION: Normal study.  No evidence of pelvic mass or other significant
abnormality.

## 2010-12-17 LAB — GC/CHLAMYDIA PROBE AMP, GENITAL: Chlamydia, DNA Probe: NEGATIVE

## 2011-01-04 ENCOUNTER — Encounter: Payer: Medicaid Other | Admitting: Family Medicine

## 2015-11-17 ENCOUNTER — Other Ambulatory Visit (HOSPITAL_COMMUNITY)
Admission: RE | Admit: 2015-11-17 | Discharge: 2015-11-17 | Disposition: A | Discharge status: Home / Self Care | Payer: Medicaid Other | Source: Ambulatory Visit | Attending: Obstetrics and Gynecology | Admitting: Obstetrics and Gynecology

## 2015-11-17 ENCOUNTER — Encounter: Payer: Self-pay | Admitting: Obstetrics and Gynecology

## 2015-11-17 ENCOUNTER — Ambulatory Visit (INDEPENDENT_AMBULATORY_CARE_PROVIDER_SITE_OTHER): Payer: Medicaid Other | Admitting: Obstetrics and Gynecology

## 2015-11-17 VITALS — BP 124/76 | HR 81 | Temp 98.4°F | Ht 63.0 in | Wt 256.8 lb

## 2015-11-17 DIAGNOSIS — O34219 Maternal care for unspecified type scar from previous cesarean delivery: Secondary | ICD-10-CM

## 2015-11-17 DIAGNOSIS — Z113 Encounter for screening for infections with a predominantly sexual mode of transmission: Secondary | ICD-10-CM | POA: Diagnosis present

## 2015-11-17 DIAGNOSIS — O09529 Supervision of elderly multigravida, unspecified trimester: Secondary | ICD-10-CM | POA: Insufficient documentation

## 2015-11-17 DIAGNOSIS — Z23 Encounter for immunization: Secondary | ICD-10-CM

## 2015-11-17 DIAGNOSIS — O0990 Supervision of high risk pregnancy, unspecified, unspecified trimester: Secondary | ICD-10-CM | POA: Insufficient documentation

## 2015-11-17 DIAGNOSIS — O09292 Supervision of pregnancy with other poor reproductive or obstetric history, second trimester: Secondary | ICD-10-CM | POA: Diagnosis not present

## 2015-11-17 DIAGNOSIS — O09299 Supervision of pregnancy with other poor reproductive or obstetric history, unspecified trimester: Secondary | ICD-10-CM | POA: Insufficient documentation

## 2015-11-17 DIAGNOSIS — O09522 Supervision of elderly multigravida, second trimester: Secondary | ICD-10-CM | POA: Diagnosis present

## 2015-11-17 DIAGNOSIS — O0992 Supervision of high risk pregnancy, unspecified, second trimester: Secondary | ICD-10-CM

## 2015-11-17 LAB — POCT URINALYSIS DIP (DEVICE)
BILIRUBIN URINE: NEGATIVE
Glucose, UA: 100 mg/dL — AB
Ketones, ur: NEGATIVE mg/dL
Leukocytes, UA: NEGATIVE
NITRITE: NEGATIVE
PH: 5.5 (ref 5.0–8.0)
Protein, ur: NEGATIVE mg/dL
Specific Gravity, Urine: >=1.03 (ref 1.005–1.030)
UROBILINOGEN UA: 1 mg/dL (ref 0.0–1.0)

## 2015-11-17 NOTE — Patient Instructions (Signed)
Second Trimester of Pregnancy The second trimester is from week 13 through week 28, months 4 through 6. The second trimester is often a time when you feel your best. Your body has also adjusted to being pregnant, and you begin to feel better physically. Usually, morning sickness has lessened or quit completely, you may have more energy, and you may have an increase in appetite. The second trimester is also a time when the fetus is growing rapidly. At the end of the sixth month, the fetus is about 9 inches long and weighs about 1 pounds. You will likely begin to feel the baby move (quickening) between 18 and 20 weeks of the pregnancy. BODY CHANGES Your body goes through many changes during pregnancy. The changes vary from woman to woman.   Your weight will continue to increase. You will notice your lower abdomen bulging out.  You may begin to get stretch marks on your hips, abdomen, and breasts.  You may develop headaches that can be relieved by medicines approved by your health care provider.  You may urinate more often because the fetus is pressing on your bladder.  You may develop or continue to have heartburn as a result of your pregnancy.  You may develop constipation because certain hormones are causing the muscles that push waste through your intestines to slow down.  You may develop hemorrhoids or swollen, bulging veins (varicose veins).  You may have back pain because of the weight gain and pregnancy hormones relaxing your joints between the bones in your pelvis and as a result of a shift in weight and the muscles that support your balance.  Your breasts will continue to grow and be tender.  Your gums may bleed and may be sensitive to brushing and flossing.  Dark spots or blotches (chloasma, mask of pregnancy) may develop on your face. This will likely fade after the baby is born.  A dark line from your belly button to the pubic area (linea nigra) may appear. This will likely  fade after the baby is born.  You may have changes in your hair. These can include thickening of your hair, rapid growth, and changes in texture. Some women also have hair loss during or after pregnancy, or hair that feels dry or thin. Your hair will most likely return to normal after your baby is born. WHAT TO EXPECT AT YOUR PRENATAL VISITS During a routine prenatal visit:  You will be weighed to make sure you and the fetus are growing normally.  Your blood pressure will be taken.  Your abdomen will be measured to track your baby's growth.  The fetal heartbeat will be listened to.  Any test results from the previous visit will be discussed. Your health care provider may ask you:  How you are feeling.  If you are feeling the baby move.  If you have had any abnormal symptoms, such as leaking fluid, bleeding, severe headaches, or abdominal cramping.  If you are using any tobacco products, including cigarettes, chewing tobacco, and electronic cigarettes.  If you have any questions. Other tests that may be performed during your second trimester include:  Blood tests that check for:  Low iron levels (anemia).  Gestational diabetes (between 24 and 28 weeks).  Rh antibodies.  Urine tests to check for infections, diabetes, or protein in the urine.  An ultrasound to confirm the proper growth and development of the baby.  An amniocentesis to check for possible genetic problems.  Fetal screens for spina bifida   and Down syndrome.  HIV (human immunodeficiency virus) testing. Routine prenatal testing includes screening for HIV, unless you choose not to have this test. HOME CARE INSTRUCTIONS   Avoid all smoking, herbs, alcohol, and unprescribed drugs. These chemicals affect the formation and growth of the baby.  Do not use any tobacco products, including cigarettes, chewing tobacco, and electronic cigarettes. If you need help quitting, ask your health care provider. You may receive  counseling support and other resources to help you quit.  Follow your health care provider's instructions regarding medicine use. There are medicines that are either safe or unsafe to take during pregnancy.  Exercise only as directed by your health care provider. Experiencing uterine cramps is a good sign to stop exercising.  Continue to eat regular, healthy meals.  Wear a good support bra for breast tenderness.  Do not use hot tubs, steam rooms, or saunas.  Wear your seat belt at all times when driving.  Avoid raw meat, uncooked cheese, cat litter boxes, and soil used by cats. These carry germs that can cause birth defects in the baby.  Take your prenatal vitamins.  Take 1500-2000 mg of calcium daily starting at the 20th week of pregnancy until you deliver your baby.  Try taking a stool softener (if your health care provider approves) if you develop constipation. Eat more high-fiber foods, such as fresh vegetables or fruit and whole grains. Drink plenty of fluids to keep your urine clear or pale yellow.  Take warm sitz baths to soothe any pain or discomfort caused by hemorrhoids. Use hemorrhoid cream if your health care provider approves.  If you develop varicose veins, wear support hose. Elevate your feet for 15 minutes, 3-4 times a day. Limit salt in your diet.  Avoid heavy lifting, wear low heel shoes, and practice good posture.  Rest with your legs elevated if you have leg cramps or low back pain.  Visit your dentist if you have not gone yet during your pregnancy. Use a soft toothbrush to brush your teeth and be gentle when you floss.  A sexual relationship may be continued unless your health care provider directs you otherwise.  Continue to go to all your prenatal visits as directed by your health care provider. SEEK MEDICAL CARE IF:   You have dizziness.  You have mild pelvic cramps, pelvic pressure, or nagging pain in the abdominal area.  You have persistent nausea,  vomiting, or diarrhea.  You have a bad smelling vaginal discharge.  You have pain with urination. SEEK IMMEDIATE MEDICAL CARE IF:   You have a fever.  You are leaking fluid from your vagina.  You have spotting or bleeding from your vagina.  You have severe abdominal cramping or pain.  You have rapid weight gain or loss.  You have shortness of breath with chest pain.  You notice sudden or extreme swelling of your face, hands, ankles, feet, or legs.  You have not felt your baby move in over an hour.  You have severe headaches that do not go away with medicine.  You have vision changes.   This information is not intended to replace advice given to you by your health care provider. Make sure you discuss any questions you have with your health care provider.   Document Released: 08/24/2001 Document Revised: 09/20/2014 Document Reviewed: 10/31/2012 Elsevier Interactive Patient Education 2016 [employer]  Contraception Choices Contraception (birth control) is the use of any methods or devices to prevent pregnancy. Below are some methods to   help avoid pregnancy. HORMONAL METHODS   Contraceptive implant. This is a thin, plastic tube containing progesterone hormone. It does not contain estrogen hormone. Your health care provider inserts the tube in the inner part of the upper arm. The tube can remain in place for up to 3 years. After 3 years, the implant must be removed. The implant prevents the ovaries from releasing an egg (ovulation), thickens the cervical mucus to prevent sperm from entering the uterus, and thins the lining of the inside of the uterus.  Progesterone-only injections. These injections are given every 3 months by your health care provider to prevent pregnancy. This synthetic progesterone hormone stops the ovaries from releasing eggs. It also thickens cervical mucus and changes the uterine lining. This makes it harder for sperm to survive in the uterus.  Birth  control pills. These pills contain estrogen and progesterone hormone. They work by preventing the ovaries from releasing eggs (ovulation). They also cause the cervical mucus to thicken, preventing the sperm from entering the uterus. Birth control pills are prescribed by a health care provider.Birth control pills can also be used to treat heavy periods.  Minipill. This type of birth control pill contains only the progesterone hormone. They are taken every day of each month and must be prescribed by your health care provider.  Birth control patch. The patch contains hormones similar to those in birth control pills. It must be changed once a week and is prescribed by a health care provider.  Vaginal ring. The ring contains hormones similar to those in birth control pills. It is left in the vagina for 3 weeks, removed for 1 week, and then a new one is put back in place. The patient must be comfortable inserting and removing the ring from the vagina.A health care provider's prescription is necessary.  Emergency contraception. Emergency contraceptives prevent pregnancy after unprotected sexual intercourse. This pill can be taken right after sex or up to 5 days after unprotected sex. It is most effective the sooner you take the pills after having sexual intercourse. Most emergency contraceptive pills are available without a prescription. Check with your pharmacist. Do not use emergency contraception as your only form of birth control. BARRIER METHODS   Female condom. This is a thin sheath (latex or rubber) that is worn over the penis during sexual intercourse. It can be used with spermicide to increase effectiveness.  Female condom. This is a soft, loose-fitting sheath that is put into the vagina before sexual intercourse.  Diaphragm. This is a soft, latex, dome-shaped barrier that must be fitted by a health care provider. It is inserted into the vagina, along with a spermicidal jelly. It is inserted before  intercourse. The diaphragm should be left in the vagina for 6 to 8 hours after intercourse.  Cervical cap. This is a round, soft, latex or plastic cup that fits over the cervix and must be fitted by a health care provider. The cap can be left in place for up to 48 hours after intercourse.  Sponge. This is a soft, circular piece of polyurethane foam. The sponge has spermicide in it. It is inserted into the vagina after wetting it and before sexual intercourse.  Spermicides. These are chemicals that kill or block sperm from entering the cervix and uterus. They come in the form of creams, jellies, suppositories, foam, or tablets. They do not require a prescription. They are inserted into the vagina with an applicator before having sexual intercourse. The process must be repeated every   time you have sexual intercourse. INTRAUTERINE CONTRACEPTION  Intrauterine device (IUD). This is a T-shaped device that is put in a woman's uterus during a menstrual period to prevent pregnancy. There are 2 types:  Copper IUD. This type of IUD is wrapped in copper wire and is placed inside the uterus. Copper makes the uterus and fallopian tubes produce a fluid that kills sperm. It can stay in place for 10 years.  Hormone IUD. This type of IUD contains the hormone progestin (synthetic progesterone). The hormone thickens the cervical mucus and prevents sperm from entering the uterus, and it also thins the uterine lining to prevent implantation of a fertilized egg. The hormone can weaken or kill the sperm that get into the uterus. It can stay in place for 3-5 years, depending on which type of IUD is used. PERMANENT METHODS OF CONTRACEPTION  Female tubal ligation. This is when the woman's fallopian tubes are surgically sealed, tied, or blocked to prevent the egg from traveling to the uterus.  Hysteroscopic sterilization. This involves placing a small coil or insert into each fallopian tube. Your doctor uses a technique  called hysteroscopy to do the procedure. The device causes scar tissue to form. This results in permanent blockage of the fallopian tubes, so the sperm cannot fertilize the egg. It takes about 3 months after the procedure for the tubes to become blocked. You must use another form of birth control for these 3 months.  Female sterilization. This is when the female has the tubes that carry sperm tied off (vasectomy).This blocks sperm from entering the vagina during sexual intercourse. After the procedure, the man can still ejaculate fluid (semen). NATURAL PLANNING METHODS  Natural family planning. This is not having sexual intercourse or using a barrier method (condom, diaphragm, cervical cap) on days the woman could become pregnant.  Calendar method. This is keeping track of the length of each menstrual cycle and identifying when you are fertile.  Ovulation method. This is avoiding sexual intercourse during ovulation.  Symptothermal method. This is avoiding sexual intercourse during ovulation, using a thermometer and ovulation symptoms.  Post-ovulation method. This is timing sexual intercourse after you have ovulated. Regardless of which type or method of contraception you choose, it is important that you use condoms to protect against the transmission of sexually transmitted infections (STIs). Talk with your health care provider about which form of contraception is most appropriate for you.   This information is not intended to replace advice given to you by your health care provider. Make sure you discuss any questions you have with your health care provider.   Document Released: 08/30/2005 Document Revised: 09/04/2013 Document Reviewed: 02/22/2013 Elsevier Interactive Patient Education 2016 [employer]  Postpartum Depression and Baby Blues The postpartum period begins right after the birth of a baby. During this time, there is often a great amount of joy and excitement. It is also a time of  many changes in the life of the parents. Regardless of how many times a mother gives birth, each child brings new challenges and dynamics to the family. It is not unusual to have feelings of excitement along with confusing shifts in moods, emotions, and thoughts. All mothers are at risk of developing postpartum depression or the "baby blues." These mood changes can occur right after giving birth, or they may occur many months after giving birth. The baby blues or postpartum depression can be mild or severe. Additionally, postpartum depression can go away rather quickly, or it can be a  long-term condition.  CAUSES Raised hormone levels and the rapid drop in those levels are thought to be a main cause of postpartum depression and the baby blues. A number of hormones change during and after pregnancy. Estrogen and progesterone usually decrease right after the delivery of your baby. The levels of thyroid hormone and various cortisol steroids also rapidly drop. Other factors that play a role in these mood changes include major life events and genetics.  RISK FACTORS If you have any of the following risks for the baby blues or postpartum depression, know what symptoms to watch out for during the postpartum period. Risk factors that may increase the likelihood of getting the baby blues or postpartum depression include:  Having a personal or family history of depression.   Having depression while being pregnant.   Having premenstrual mood issues or mood issues related to oral contraceptives.  Having a lot of life stress.   Having marital conflict.   Lacking a social support network.   Having a baby with special needs.   Having health problems, such as diabetes.  SIGNS AND SYMPTOMS Symptoms of baby blues include:  Brief changes in mood, such as going from extreme happiness to sadness.  Decreased concentration.   Difficulty sleeping.   Crying spells, tearfulness.   Irritability.    Anxiety.  Symptoms of postpartum depression typically begin within the first month after giving birth. These symptoms include:  Difficulty sleeping or excessive sleepiness.   Marked weight loss.   Agitation.   Feelings of worthlessness.   Lack of interest in activity or food.  Postpartum psychosis is a very serious condition and can be dangerous. Fortunately, it is rare. Displaying any of the following symptoms is cause for immediate medical attention. Symptoms of postpartum psychosis include:   Hallucinations and delusions.   Bizarre or disorganized behavior.   Confusion or disorientation.  DIAGNOSIS  A diagnosis is made by an evaluation of your symptoms. There are no medical or lab tests that lead to a diagnosis, but there are various questionnaires that a health care provider may use to identify those with the baby blues, postpartum depression, or psychosis. Often, a screening tool called the [place] Postnatal Depression Scale is used to diagnose depression in the postpartum period.  TREATMENT The baby blues usually goes away on its own in 1-2 weeks. Social support is often all that is needed. You will be encouraged to get adequate sleep and rest. Occasionally, you may be given medicines to help you sleep.  Postpartum depression requires treatment because it can last several months or longer if it is not treated. Treatment may include individual or group therapy, medicine, or both to address any social, physiological, and psychological factors that may play a role in the depression. Regular exercise, a healthy diet, rest, and social support may also be strongly recommended.  Postpartum psychosis is more serious and needs treatment right away. Hospitalization is often needed. HOME CARE INSTRUCTIONS  Get as much rest as you can. Nap when the baby sleeps.   Exercise regularly. Some women find yoga and walking to be beneficial.   Eat a balanced and nourishing diet.    Do little things that you enjoy. Have a cup of tea, take a bubble bath, read your favorite magazine, or listen to your favorite music.  Avoid alcohol.   Ask for help with household chores, cooking, grocery shopping, or running errands as needed. Do not try to do everything.   Talk to people close to  you about how you are feeling. Get support from your partner, family members, friends, or other new moms.  Try to stay positive in how you think. Think about the things you are grateful for.   Do not spend a lot of time alone.   Only take over-the-counter or prescription medicine as directed by your health care provider.  Keep all your postpartum appointments.   Let your health care provider know if you have any concerns.  SEEK MEDICAL CARE IF: You are having a reaction to or problems with your medicine. SEEK IMMEDIATE MEDICAL CARE IF:  You have suicidal feelings.   You think you may harm the baby or someone else. MAKE SURE YOU:  Understand these instructions.  Will watch your condition.  Will get help right away if you are not doing well or get worse.   This information is not intended to replace advice given to you by your health care provider. Make sure you discuss any questions you have with your health care provider.   Document Released: 06/03/2004 Document Revised: 09/04/2013 Document Reviewed: 06/11/2013 Elsevier Interactive Patient Education [employer]  Breastfeeding Deciding to breastfeed is one of the best choices you can make for you and your baby. A change in hormones during pregnancy causes your breast tissue to grow and increases the number and size of your milk ducts. These hormones also allow proteins, sugars, and fats from your blood supply to make breast milk in your milk-producing glands. Hormones prevent breast milk from being released before your baby is born as well as prompt milk flow after birth. Once breastfeeding has begun, thoughts of  your baby, as well as his or her sucking or crying, can stimulate the release of milk from your milk-producing glands.  BENEFITS OF BREASTFEEDING For Your Baby  Your first milk (colostrum) helps your baby's digestive system function better.  There are antibodies in your milk that help your baby fight off infections.  Your baby has a lower incidence of asthma, allergies, and sudden infant death syndrome.  The nutrients in breast milk are better for your baby than infant formulas and are designed uniquely for your baby's needs.  Breast milk improves your baby's brain development.  Your baby is less likely to develop other conditions, such as childhood obesity, asthma, or type 2 diabetes mellitus. For You  Breastfeeding helps to create a very special bond between you and your baby.  Breastfeeding is convenient. Breast milk is always available at the correct temperature and costs nothing.  Breastfeeding helps to burn calories and helps you lose the weight gained during pregnancy.  Breastfeeding makes your uterus contract to its prepregnancy size faster and slows bleeding (lochia) after you give birth.   Breastfeeding helps to lower your risk of developing type 2 diabetes mellitus, osteoporosis, and breast or ovarian cancer later in life. SIGNS THAT YOUR BABY IS HUNGRY Early Signs of Hunger  Increased alertness or activity.  Stretching.  Movement of the head from side to side.  Movement of the head and opening of the mouth when the corner of the mouth or cheek is stroked (rooting).  Increased sucking sounds, smacking lips, cooing, sighing, or squeaking.  Hand-to-mouth movements.  Increased sucking of fingers or hands. Late Signs of Hunger  Fussing.  Intermittent crying. Extreme Signs of Hunger Signs of extreme hunger will require calming and consoling before your baby will be able to breastfeed successfully. Do not wait for the following signs of extreme hunger to occur  before you initiate breastfeeding:  Restlessness.  A loud, strong cry.  Screaming. BREASTFEEDING BASICS Breastfeeding Initiation  Find a comfortable place to sit or lie down, with your neck and back well supported.  Place a pillow or rolled up blanket under your baby to bring him or her to the level of your breast (if you are seated). Nursing pillows are specially designed to help support your arms and your baby while you breastfeed.  Make sure that your baby's abdomen is facing your abdomen.  Gently massage your breast. With your fingertips, massage from your chest wall toward your nipple in a circular motion. This encourages milk flow. You may need to continue this action during the feeding if your milk flows slowly.  Support your breast with 4 fingers underneath and your thumb above your nipple. Make sure your fingers are well away from your nipple and your baby's mouth.  Stroke your baby's lips gently with your finger or nipple.  When your baby's mouth is open wide enough, quickly bring your baby to your breast, placing your entire nipple and as much of the colored area around your nipple (areola) as possible into your baby's mouth.  More areola should be visible above your baby's upper lip than below the lower lip.  Your baby's tongue should be between his or her lower gum and your breast.  Ensure that your baby's mouth is correctly positioned around your nipple (latched). Your baby's lips should create a seal on your breast and be turned out (everted).  It is common for your baby to suck about 2-3 minutes in order to start the flow of breast milk. Latching Teaching your baby how to latch on to your breast properly is very important. An improper latch can cause nipple pain and decreased milk supply for you and poor weight gain in your baby. Also, if your baby is not latched onto your nipple properly, he or she may swallow some air during feeding. This can make your baby fussy.  Burping your baby when you switch breasts during the feeding can help to get rid of the air. However, teaching your baby to latch on properly is still the best way to prevent fussiness from swallowing air while breastfeeding. Signs that your baby has successfully latched on to your nipple:  Silent tugging or silent sucking, without causing you pain.  Swallowing heard between every 3-4 sucks.  Muscle movement above and in front of his or her ears while sucking. Signs that your baby has not successfully latched on to nipple:  Sucking sounds or smacking sounds from your baby while breastfeeding.  Nipple pain. If you think your baby has not latched on correctly, slip your finger into the corner of your baby's mouth to break the suction and place it between your baby's gums. Attempt breastfeeding initiation again. Signs of Successful Breastfeeding Signs from your baby:  A gradual decrease in the number of sucks or complete cessation of sucking.  Falling asleep.  Relaxation of his or her body.  Retention of a small amount of milk in his or her mouth.  Letting go of your breast by himself or herself. Signs from you:  Breasts that have increased in firmness, weight, and size 1-3 hours after feeding.  Breasts that are softer immediately after breastfeeding.  Increased milk volume, as well as a change in milk consistency and color by the fifth day of breastfeeding.  Nipples that are not sore, cracked, or bleeding. Signs That Your Pecola Leisure is Getting  Enough Milk  Wetting at least 3 diapers in a 24-hour period. The urine should be clear and pale yellow by age 794 days.  At least 3 stools in a 24-hour period by age 794 days. The stool should be soft and yellow.  At least 3 stools in a 24-hour period by age 64 days. The stool should be seedy and yellow.  No loss of weight greater than 10% of birth weight during the first 31 days of age.  Average weight gain of 4-7 ounces (113-198 g) per week  after age 7 days.  Consistent daily weight gain by age 794 days, without weight loss after the age of 2 weeks. After a feeding, your baby may spit up a small amount. This is common. BREASTFEEDING FREQUENCY AND DURATION Frequent feeding will help you make more milk and can prevent sore nipples and breast engorgement. Breastfeed when you feel the need to reduce the fullness of your breasts or when your baby shows signs of hunger. This is called "breastfeeding on demand." Avoid introducing a pacifier to your baby while you are working to establish breastfeeding (the first 4-6 weeks after your baby is born). After this time you may choose to use a pacifier. Research has shown that pacifier use during the first year of a baby's life decreases the risk of sudden infant death syndrome (SIDS). Allow your baby to feed on each breast as long as he or she wants. Breastfeed until your baby is finished feeding. When your baby unlatches or falls asleep while feeding from the first breast, offer the second breast. Because newborns are often sleepy in the first few weeks of life, you may need to awaken your baby to get him or her to feed. Breastfeeding times will vary from baby to baby. However, the following rules can serve as a guide to help you ensure that your baby is properly fed:  Newborns (babies 81 weeks of age or younger) may breastfeed every 1-3 hours.  Newborns should not go longer than 3 hours during the day or 5 hours during the night without breastfeeding.  You should breastfeed your baby a minimum of 8 times in a 24-hour period until you begin to introduce solid foods to your baby at around 28 months of age. BREAST MILK PUMPING Pumping and storing breast milk allows you to ensure that your baby is exclusively fed your breast milk, even at times when you are unable to breastfeed. This is especially important if you are going back to work while you are still breastfeeding or when you are not able to be  present during feedings. Your lactation consultant can give you guidelines on how long it is safe to store breast milk. A breast pump is a machine that allows you to pump milk from your breast into a sterile bottle. The pumped breast milk can then be stored in a refrigerator or freezer. Some breast pumps are operated by hand, while others use electricity. Ask your lactation consultant which type will work best for you. Breast pumps can be purchased, but some hospitals and breastfeeding support groups lease breast pumps on a monthly basis. A lactation consultant can teach you how to hand express breast milk, if you prefer not to use a pump. CARING FOR YOUR BREASTS WHILE YOU BREASTFEED Nipples can become dry, cracked, and sore while breastfeeding. The following recommendations can help keep your breasts moisturized and healthy:  Avoid using soap on your nipples.  Wear a supportive bra. Although not required,  special nursing bras and tank tops are designed to allow access to your breasts for breastfeeding without taking off your entire bra or top. Avoid wearing underwire-style bras or extremely tight bras.  Air dry your nipples for 3-60minutes after each feeding.  Use only cotton bra pads to absorb leaked breast milk. Leaking of breast milk between feedings is normal.  Use lanolin on your nipples after breastfeeding. Lanolin helps to maintain your skin's normal moisture barrier. If you use pure lanolin, you do not need to wash it off before feeding your baby again. Pure lanolin is not toxic to your baby. You may also hand express a few drops of breast milk and gently massage that milk into your nipples and allow the milk to air dry. In the first few weeks after giving birth, some women experience extremely full breasts (engorgement). Engorgement can make your breasts feel heavy, warm, and tender to the touch. Engorgement peaks within 3-5 days after you give birth. The following recommendations can help  ease engorgement:  Completely empty your breasts while breastfeeding or pumping. You may want to start by applying warm, moist heat (in the shower or with warm water-soaked hand towels) just before feeding or pumping. This increases circulation and helps the milk flow. If your baby does not completely empty your breasts while breastfeeding, pump any extra milk after he or she is finished.  Wear a snug bra (nursing or regular) or tank top for 1-2 days to signal your body to slightly decrease milk production.  Apply ice packs to your breasts, unless this is too uncomfortable for you.  Make sure that your baby is latched on and positioned properly while breastfeeding. If engorgement persists after 48 hours of following these recommendations, contact your health care provider or a Advertising copywriter. OVERALL HEALTH CARE RECOMMENDATIONS WHILE BREASTFEEDING  Eat healthy foods. Alternate between meals and snacks, eating 3 of each per day. Because what you eat affects your breast milk, some of the foods may make your baby more irritable than usual. Avoid eating these foods if you are sure that they are negatively affecting your baby.  Drink milk, fruit juice, and water to satisfy your thirst (about 10 glasses a day).  Rest often, relax, and continue to take your prenatal vitamins to prevent fatigue, stress, and anemia.  Continue breast self-awareness checks.  Avoid chewing and smoking tobacco. Chemicals from cigarettes that pass into breast milk and exposure to secondhand smoke may harm your baby.  Avoid alcohol and drug use, including marijuana. Some medicines that may be harmful to your baby can pass through breast milk. It is important to ask your health care provider before taking any medicine, including all over-the-counter and prescription medicine as well as vitamin and herbal supplements. It is possible to become pregnant while breastfeeding. If birth control is desired, ask your health  care provider about options that will be safe for your baby. SEEK MEDICAL CARE IF:  You feel like you want to stop breastfeeding or have become frustrated with breastfeeding.  You have painful breasts or nipples.  Your nipples are cracked or bleeding.  Your breasts are red, tender, or warm.  You have a swollen area on either breast.  You have a fever or chills.  You have nausea or vomiting.  You have drainage other than breast milk from your nipples.  Your breasts do not become full before feedings by the fifth day after you give birth.  You feel sad and depressed.  Your baby  is too sleepy to eat well.  Your baby is having trouble sleeping.   Your baby is wetting less than 3 diapers in a 24-hour period.  Your baby has less than 3 stools in a 24-hour period.  Your baby's skin or the white part of his or her eyes becomes yellow.   Your baby is not gaining weight by 225 days of age. SEEK IMMEDIATE MEDICAL CARE IF:  Your baby is overly tired (lethargic) and does not want to wake up and feed.  Your baby develops an unexplained fever.   This information is not intended to replace advice given to you by your health care provider. Make sure you discuss any questions you have with your health care provider.   Document Released: 08/30/2005 Document Revised: 05/21/2015 Document Reviewed: 02/21/2013 Elsevier Interactive Patient Education [employer]2016 Elsevier Inc.

## 2015-11-17 NOTE — Progress Notes (Signed)
   Subjective:    Ashley Pugh is a [id number] 7435w3d being seen today for her first obstetrical visit.  Her obstetrical history is significant for advanced maternal age, obesity and h/o pre-eclampsia, previous cesarean section x 2. Patient does intend to breast feed. Pregnancy history fully reviewed.  Patient reports feeling sad without explaination. She has been married for the past 2 years. This was not a planned pregnancy but it is desired. She denies any history of depression or postpartum depression  Filed Vitals:   11/17/15 0914 11/17/15 0916 11/17/15 0917  BP: 135/91 124/76   Pulse: 91 81   Temp: 98.4 F (36.9 C)    Height:   5\' 3"  (1.6 m)  Weight: 256 lb 12.8 oz (116.484 kg)      HISTORY: OB History  Gravida Para Term Preterm AB SAB TAB Ectopic Multiple Living  5 2 1 1 2 1 1   4     # Outcome Date GA Lbr Len/2nd Weight Sex Delivery Anes PTL Lv  5 Current           4 Preterm 11/15/09 3457w0d  4 lb 2 oz (1.871 kg) M CS-LTranv None  Y     Complications: Severe preeclampsia, third trimester  3 TAB 2005          2 Term 07/31/03 2957w0d  5 lb 1 oz (2.296 kg) M CS-LTranv EPI  Y     Complications: Failure to progress in labor  1 SAB 2003             Past Medical History  Diagnosis Date  . Hx of preeclampsia, prior pregnancy, currently pregnant    Past Surgical History  Procedure Laterality Date  . Cesarean section    . Breast reduction surgery     History reviewed. No pertinent family history.   Exam    [facility]    Assessment:    [name] Patient Active Problem List   Diagnosis Date Noted  . Supervision of high risk pregnancy, antepartum 11/17/2015  . Previous cesarean section complicating pregnancy, antepartum condition or complication 11/17/2015  . AMA (advanced maternal age) multigravida 35+ 11/17/2015  . H/O pre-eclampsia in prior pregnancy, currently pregnant 11/17/2015        Plan:     Initial labs drawn. Prenatal  vitamins. Problem list reviewed and updated. Genetic Screening discussed : undecided. Referred to genetic counseling.  Ultrasound discussed; fetal survey: ordered.  Follow up in 4 weeks. [facility] Continue ASA daily 50% of 30 min visit spent on counseling and coordination of care.     Ashley Pugh 11/17/2015

## 2015-11-17 NOTE — Progress Notes (Signed)
U/S scheduled for 11/24/2015@9 :30 AM Genetic counseling scheduled for 12/08/2015@10 :00 AM

## 2015-11-17 NOTE — Progress Notes (Signed)
Early glucola due to BMI >30 Pt reports feelings of depressionl; denies hx of depression

## 2015-11-18 LAB — PRENATAL PROFILE (SOLSTAS)
ANTIBODY SCREEN: NEGATIVE
Basophils Absolute: 0 10*3/uL (ref 0.0–0.1)
Basophils Relative: 0 % (ref 0–1)
EOS ABS: 0 10*3/uL (ref 0.0–0.7)
EOS PCT: 0 % (ref 0–5)
HCT: 35.1 % — ABNORMAL LOW (ref 36.0–46.0)
HIV 1&2 Ab, 4th Generation: NONREACTIVE
Hemoglobin: 11.3 g/dL — ABNORMAL LOW (ref 12.0–15.0)
Hepatitis B Surface Ag: NEGATIVE
LYMPHS ABS: 1.8 10*3/uL (ref 0.7–4.0)
Lymphocytes Relative: 29 % (ref 12–46)
MCH: 27.1 pg (ref 26.0–34.0)
MCHC: 32.2 g/dL (ref 30.0–36.0)
MCV: 84.2 fL (ref 78.0–100.0)
MONOS PCT: 5 % (ref 3–12)
MPV: 9.7 fL (ref 8.6–12.4)
Monocytes Absolute: 0.3 10*3/uL (ref 0.1–1.0)
Neutro Abs: 4.1 10*3/uL (ref 1.7–7.7)
Neutrophils Relative %: 66 % (ref 43–77)
PLATELETS: 235 10*3/uL (ref 150–400)
RBC: 4.17 MIL/uL (ref 3.87–5.11)
RDW: 19.5 % — ABNORMAL HIGH (ref 11.5–15.5)
RH TYPE: POSITIVE
RUBELLA: 10.9 {index} — AB (ref <0.90)
WBC: 6.2 10*3/uL (ref 4.0–10.5)

## 2015-11-18 LAB — GC/CHLAMYDIA PROBE AMP (~~LOC~~) NOT AT ARMC
Chlamydia: POSITIVE — AB
Neisseria Gonorrhea: NEGATIVE

## 2015-11-18 LAB — GLUCOSE TOLERANCE, 1 HOUR (50G) W/O FASTING: Glucose, 1 Hr, gestational: 226 mg/dL — ABNORMAL HIGH (ref <140)

## 2015-11-19 ENCOUNTER — Encounter: Payer: Self-pay | Admitting: Obstetrics and Gynecology

## 2015-11-19 ENCOUNTER — Encounter (HOSPITAL_COMMUNITY): Payer: Self-pay | Admitting: Obstetrics and Gynecology

## 2015-11-19 ENCOUNTER — Telehealth: Payer: Self-pay | Admitting: *Deleted

## 2015-11-19 DIAGNOSIS — A749 Chlamydial infection, unspecified: Secondary | ICD-10-CM

## 2015-11-19 LAB — PRESCRIPTION MONITORING PROFILE (19 PANEL)
AMPHETAMINE/METH: NEGATIVE ng/mL
BARBITURATE SCREEN, URINE: NEGATIVE ng/mL
BUPRENORPHINE, URINE: NEGATIVE ng/mL
Benzodiazepine Screen, Urine: NEGATIVE ng/mL
CREATININE, URINE: 184.58 mg/dL (ref >20.0)
Cannabinoid Scrn, Ur: NEGATIVE ng/mL
Carisoprodol, Urine: NEGATIVE ng/mL
Cocaine Metabolites: NEGATIVE ng/mL
Fentanyl, Ur: NEGATIVE ng/mL
MDMA URINE: NEGATIVE ng/mL
MEPERIDINE UR: NEGATIVE ng/mL
METHAQUALONE SCREEN (URINE): NEGATIVE ng/mL
Methadone Screen, Urine: NEGATIVE ng/mL
NITRITES URINE, INITIAL: NEGATIVE ug/mL
OPIATE SCREEN, URINE: NEGATIVE ng/mL
Oxycodone Screen, Ur: NEGATIVE ng/mL
PROPOXYPHENE: NEGATIVE ng/mL
Phencyclidine, Ur: NEGATIVE ng/mL
TAPENTADOLUR: NEGATIVE ng/mL
Tramadol Scrn, Ur: NEGATIVE ng/mL
ZOLPIDEM, URINE: NEGATIVE ng/mL
pH, Initial: 5.6 pH (ref 4.5–8.9)

## 2015-11-19 LAB — HEMOGLOBINOPATHY EVALUATION
HGB A: 97.7 % (ref 96.8–97.8)
HGB F QUANT: 0 % (ref 0.0–2.0)
HGB S QUANTITAION: 0 %
Hemoglobin Other: 0 %
Hgb A2 Quant: 2.3 % (ref 2.2–3.2)

## 2015-11-19 LAB — CULTURE, OB URINE

## 2015-11-19 MED ORDER — AZITHROMYCIN 250 MG PO TABS: ORAL_TABLET | ORAL | Status: DC

## 2015-11-19 NOTE — Telephone Encounter (Signed)
Called Ashley Pugh to notify her that her chlamyia screen came back positive and she needs treatment. Will send prescription to her pharmacy . Explained her partner needs treatment also and should refrain from intercourse/ intimate contact for 2 weeks after treatment.  Also notified her that her glucose screen came back very elevated and she is diagnosed with GDM. Gave her appointment for 0830 11/24/15 and will need 1 wk obfu after that.  Also completed Health department form.

## 2015-11-20 ENCOUNTER — Other Ambulatory Visit: Payer: Self-pay | Admitting: Obstetrics and Gynecology

## 2015-11-20 DIAGNOSIS — O24419 Gestational diabetes mellitus in pregnancy, unspecified control: Secondary | ICD-10-CM | POA: Insufficient documentation

## 2015-11-20 DIAGNOSIS — O98819 Other maternal infectious and parasitic diseases complicating pregnancy, unspecified trimester: Principal | ICD-10-CM

## 2015-11-20 DIAGNOSIS — A749 Chlamydial infection, unspecified: Secondary | ICD-10-CM

## 2015-11-21 ENCOUNTER — Encounter: Payer: Self-pay | Admitting: *Deleted

## 2015-11-24 ENCOUNTER — Ambulatory Visit: Payer: Medicaid Other | Admitting: *Deleted

## 2015-11-24 ENCOUNTER — Ambulatory Visit (HOSPITAL_COMMUNITY): Admission: RE | Admit: 2015-11-24 | Payer: Medicaid Other | Source: Ambulatory Visit

## 2015-11-24 ENCOUNTER — Other Ambulatory Visit: Payer: Self-pay | Admitting: Obstetrics and Gynecology

## 2015-11-24 ENCOUNTER — Ambulatory Visit (HOSPITAL_COMMUNITY)
Admission: RE | Admit: 2015-11-24 | Discharge: 2015-11-24 | Disposition: A | Discharge status: Home / Self Care | Payer: Medicaid Other | Source: Ambulatory Visit | Attending: Obstetrics and Gynecology | Admitting: Obstetrics and Gynecology

## 2015-11-24 ENCOUNTER — Encounter (HOSPITAL_COMMUNITY): Payer: Self-pay

## 2015-11-24 ENCOUNTER — Encounter: Payer: Medicaid Other | Attending: Family Medicine | Admitting: *Deleted

## 2015-11-24 VITALS — Ht 63.0 in | Wt 256.0 lb

## 2015-11-24 VITALS — BP 131/74 | HR 86 | Wt 256.6 lb

## 2015-11-24 DIAGNOSIS — Z3A13 13 weeks gestation of pregnancy: Secondary | ICD-10-CM | POA: Diagnosis not present

## 2015-11-24 DIAGNOSIS — O09522 Supervision of elderly multigravida, second trimester: Secondary | ICD-10-CM | POA: Insufficient documentation

## 2015-11-24 DIAGNOSIS — O0992 Supervision of high risk pregnancy, unspecified, second trimester: Secondary | ICD-10-CM

## 2015-11-24 DIAGNOSIS — O09292 Supervision of pregnancy with other poor reproductive or obstetric history, second trimester: Secondary | ICD-10-CM | POA: Diagnosis not present

## 2015-11-24 DIAGNOSIS — Z029 Encounter for administrative examinations, unspecified: Secondary | ICD-10-CM | POA: Diagnosis not present

## 2015-11-24 DIAGNOSIS — Z36 Encounter for antenatal screening of mother: Secondary | ICD-10-CM | POA: Insufficient documentation

## 2015-11-24 DIAGNOSIS — O24419 Gestational diabetes mellitus in pregnancy, unspecified control: Secondary | ICD-10-CM

## 2015-11-24 DIAGNOSIS — O09529 Supervision of elderly multigravida, unspecified trimester: Secondary | ICD-10-CM

## 2015-11-24 IMAGING — US US MFM OB COMPLETE +14 WKS
1 series · 15 of 16 positions shown · non-contrast
Comparison: none

[Series 1: us mfm ob complete +14 wks · 15 of 16 slices shown]
[im 1/16]
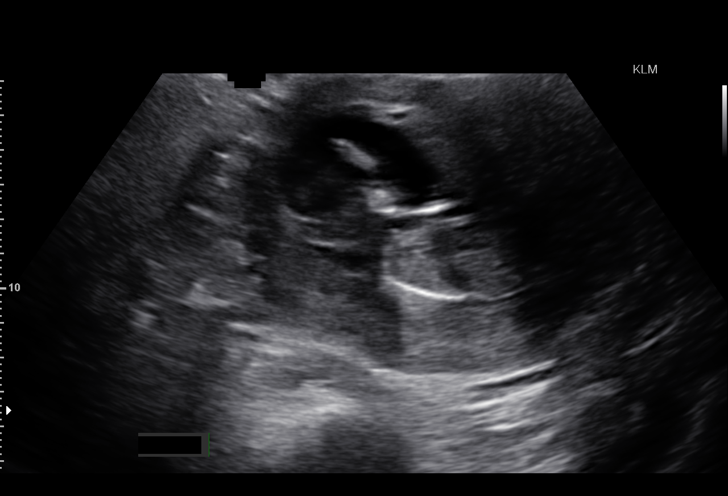
[im 2/16]
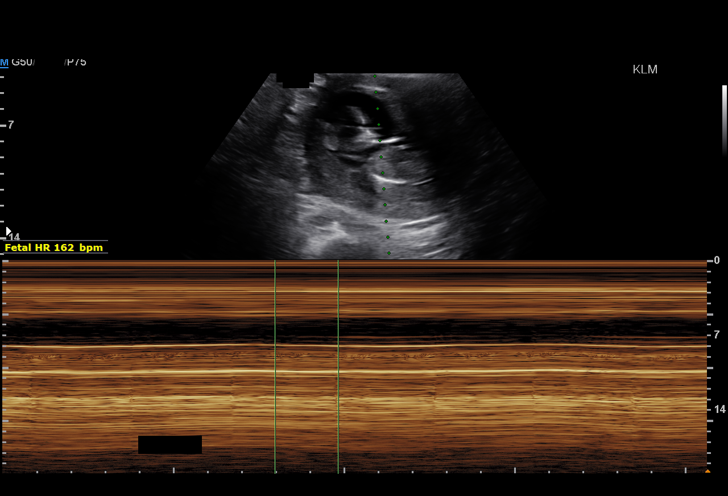
[im 3/16]
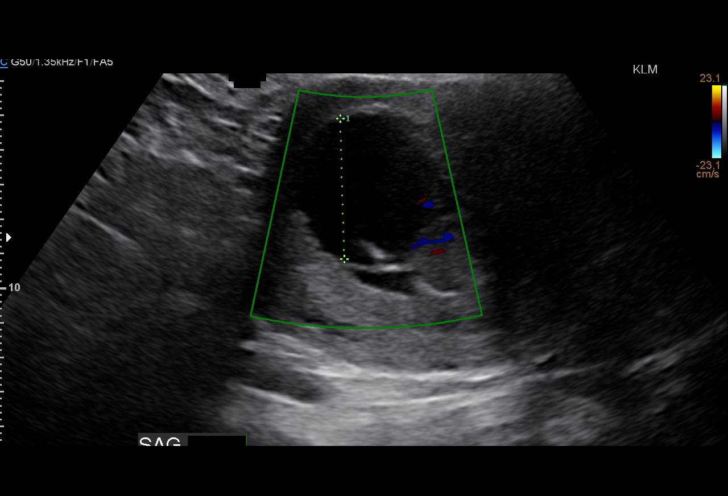
[im 4/16]
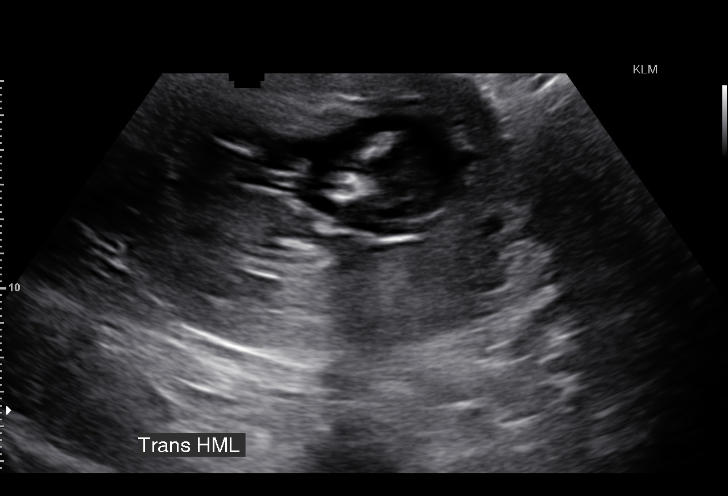
[im 5/16]
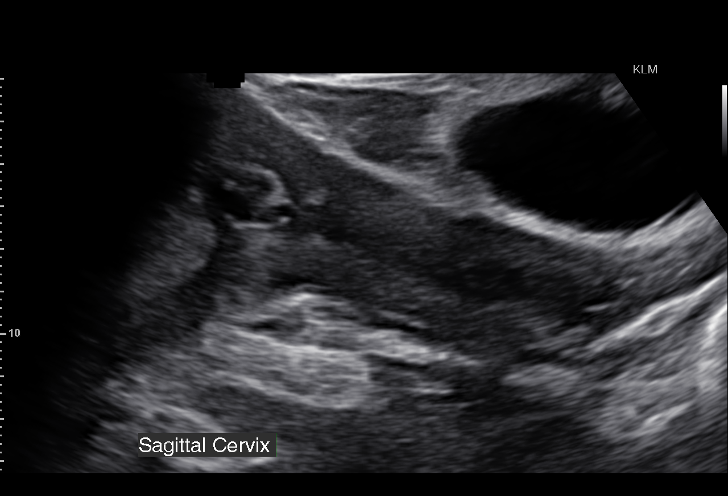
[im 6/16]
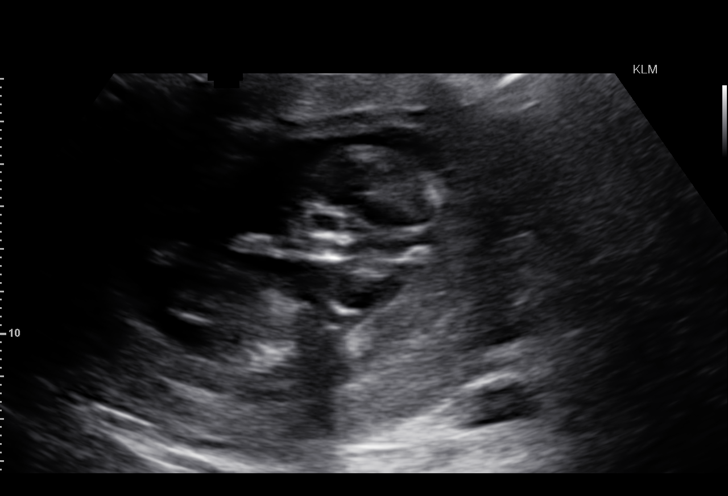
[im 7/16]
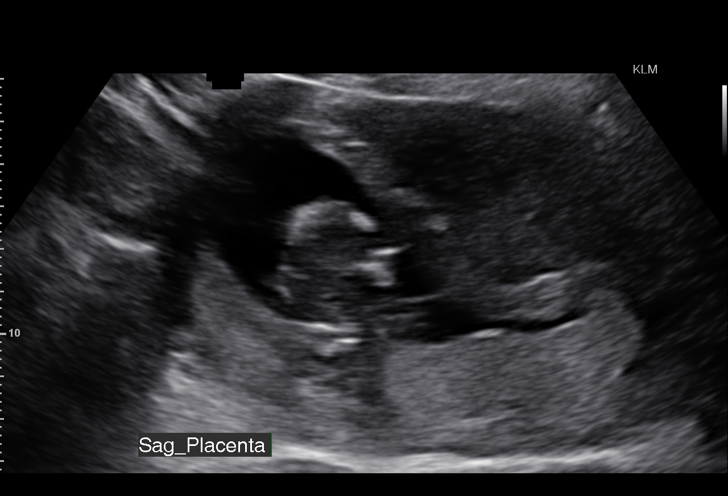
[im 9/16]
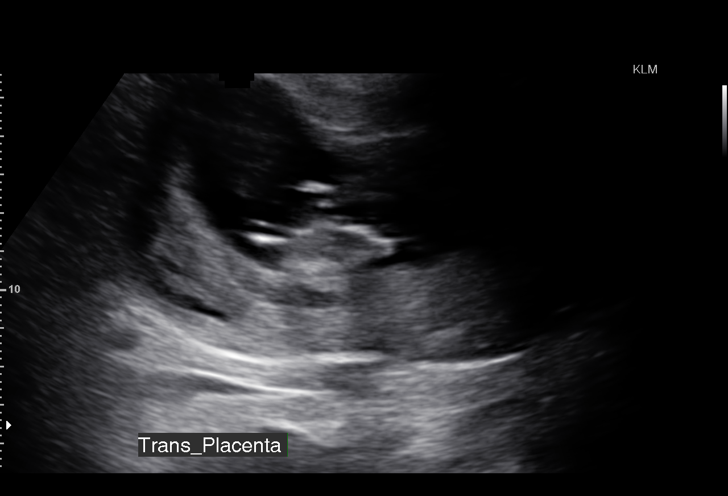
[im 10/16]
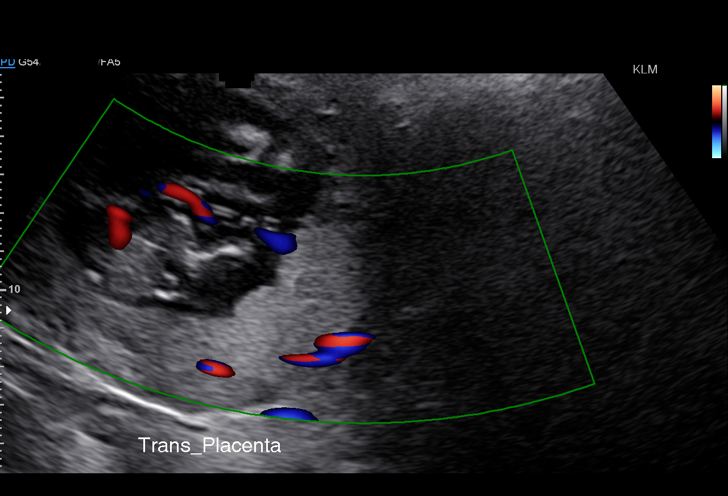
[im 11/16]
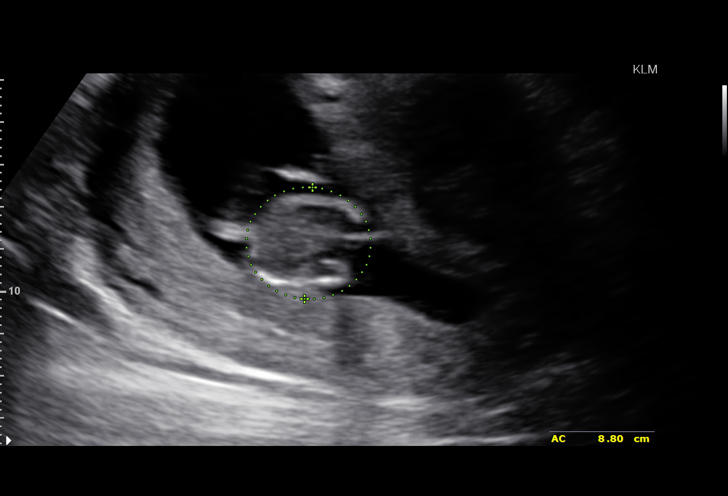
[im 12/16]
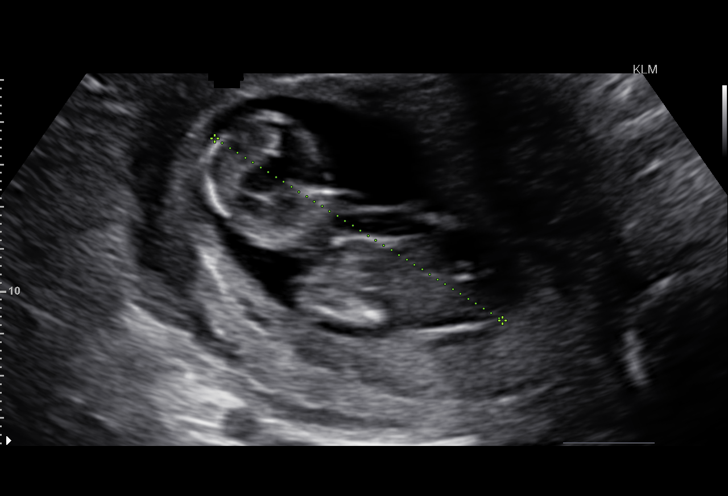
[im 13/16]
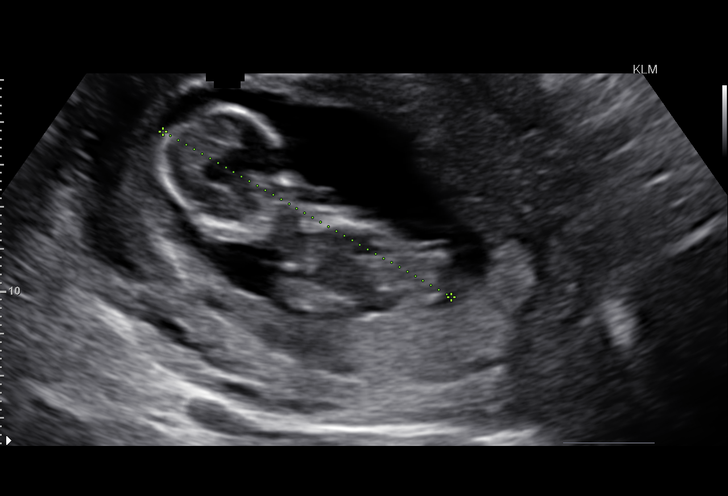
[im 14/16]
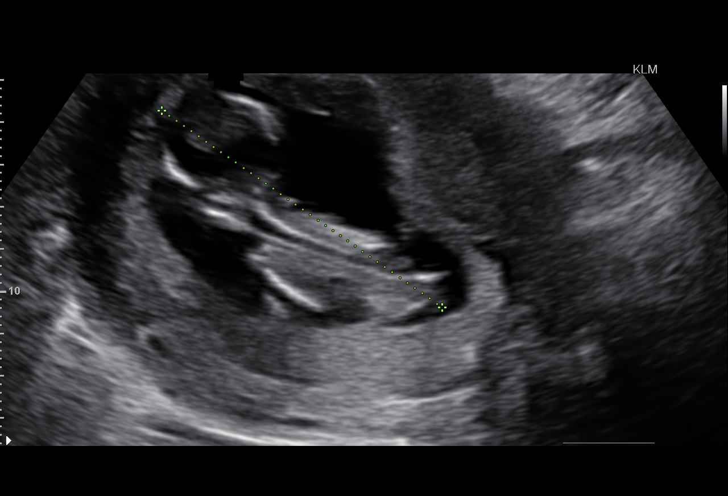
[im 15/16]
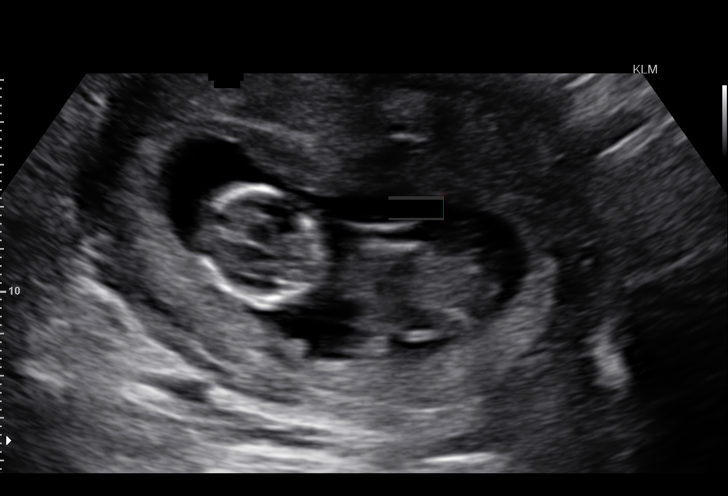
[im 16/16]
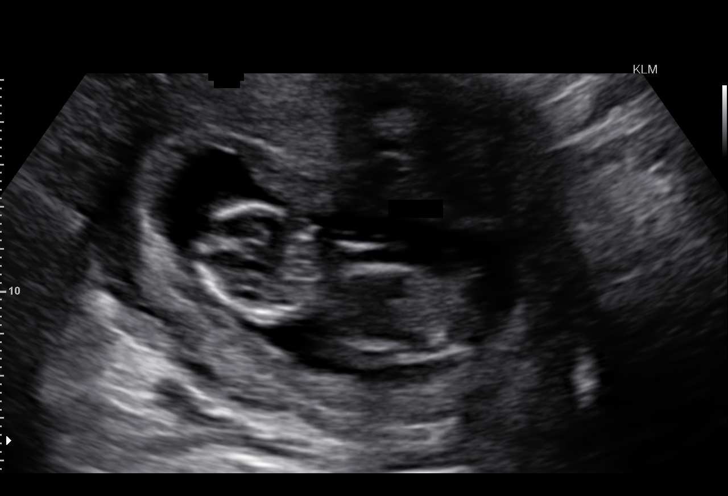

[15 of 16 positions shown; findings below may reference images not displayed]

Hospital Clinic-
Faculty Physician
OB/Gyn Clinic
[REDACTED]

WEEKS

1  JIMAS SKIRMANTAS GEDIMINAS FROLOVAS           [id number]      [id number]     [id number]
Indications

Advanced maternal age multigravida 35+,
second trimester
Poor obstetric history: Previous
preeclampsia / eclampsia/gestational HTN
Uncertain LMP,  Establish Gestational Age      Z36
13 weeks gestation of pregnancy
OB History

Gravidity:    5         Term:   2        Prem:   0        SAB:   1
TOP:          1       Ectopic:  0        Living: 2
Fetal Evaluation

Num Of Fetuses:     1
Fetal Heart         162
Rate(bpm):
Cardiac Activity:   Observed
Presentation:       Variable
Placenta:           Posterior
Amniotic Fluid
AFI FV:      Subjectively within normal limits
Biometry

CRL:        80  mm     G. Age:  13w 5d                  EDD:   05/26/16
Gestational Age

LMP:           18w 3d       Date:   07/18/15                 EDD:   04/23/16
Best:          13w 4d    Det. By:   Early Ultrasound         EDD:   05/27/16
(10/08/15)
Cervix Uterus Adnexa

Cervix
Closed
Diagnosis

Impression

SIUP at 13+5 weeks
No gross abnormalities identified
Normal amniotic fluid volume
Measurements consistent with early US
Recommendations

Follow-up ultrasound in 5 weeks for detailed anatomic survey
and genetic counseling (pt couldn't stay today and did not
want to have LOAIZA earlier than anatomy scan)

## 2015-11-24 MED ORDER — ACCU-CHEK FASTCLIX LANCETS MISC: 1.0000 | Freq: Four times a day (QID) | Status: DC

## 2015-11-24 MED ORDER — GLUCOSE BLOOD VI STRP: ORAL_STRIP | Status: DC

## 2015-11-24 NOTE — Progress Notes (Signed)
Nutrition note: GDM diet education Pt was recently diagnosed with GDM & has h/o obesity. Pt has gained 5# @ 2844w4d, which is slightly > expected. Pt reports eating 3 meals & 2 snacks/d. Pt is taking a PNV. Pt reports no N&V or heartburn. Pt reports no walking or physical activity. Pt received verbal & written education on GDM diet. Encouraged ~30 mins of walking most days of the week. Discussed wt gain goals of 11-20# or 0.5#/wk. Pt agrees to follow GDM diet with 3 meals & 1-3 snacks/d with proper CHO/ protein combination. Pt does not have WIC but plans to apply. Pt plans to BF. F/u in 4-6 wks Blondell RevealLaura Can Lucci, MS, RD, LDN, [facility]

## 2015-11-24 NOTE — Progress Notes (Signed)
  Patient was seen on 11/24/15 for Gestational Diabetes self-management . The following learning objectives were met by the patient :   States the definition of Gestational Diabetes  States why dietary management is important in controlling blood glucose  States when to check blood glucose levels  Demonstrates proper blood glucose monitoring techniques  States the effect of stress and exercise on blood glucose levels  States the importance of limiting caffeine and abstaining from alcohol and smoking  Plan:  Consider  increasing your activity level by walking daily as tolerated Begin checking BG before breakfast and 2 hours after first bit of breakfast, lunch and dinner after  as directed by MD  Take medication  as directed by MD  Blood glucose monitor given: Georgeanne Nim Lot # [id number] Exp: 2016/07/13 Blood glucose reading: 183 30pp pizza  Patient instructed to monitor glucose levels: FBS: 60 - <90 2 hour: <120  Patient received the following handouts:  Nutrition Diabetes and Pregnancy  Carbohydrate Counting List  Meal Planning worksheet  Patient will be seen for follow-up as needed.

## 2015-12-01 ENCOUNTER — Ambulatory Visit (INDEPENDENT_AMBULATORY_CARE_PROVIDER_SITE_OTHER): Payer: Medicaid Other | Admitting: Obstetrics and Gynecology

## 2015-12-01 ENCOUNTER — Encounter: Payer: Self-pay | Admitting: Obstetrics and Gynecology

## 2015-12-01 VITALS — BP 117/67 | HR 84 | Wt 254.5 lb

## 2015-12-01 DIAGNOSIS — A749 Chlamydial infection, unspecified: Secondary | ICD-10-CM

## 2015-12-01 DIAGNOSIS — O09522 Supervision of elderly multigravida, second trimester: Secondary | ICD-10-CM | POA: Diagnosis not present

## 2015-12-01 DIAGNOSIS — O24419 Gestational diabetes mellitus in pregnancy, unspecified control: Secondary | ICD-10-CM | POA: Diagnosis not present

## 2015-12-01 DIAGNOSIS — O98319 Other infections with a predominantly sexual mode of transmission complicating pregnancy, unspecified trimester: Secondary | ICD-10-CM

## 2015-12-01 DIAGNOSIS — O34219 Maternal care for unspecified type scar from previous cesarean delivery: Secondary | ICD-10-CM | POA: Diagnosis not present

## 2015-12-01 DIAGNOSIS — O09292 Supervision of pregnancy with other poor reproductive or obstetric history, second trimester: Secondary | ICD-10-CM | POA: Diagnosis not present

## 2015-12-01 DIAGNOSIS — O98819 Other maternal infectious and parasitic diseases complicating pregnancy, unspecified trimester: Secondary | ICD-10-CM

## 2015-12-01 DIAGNOSIS — O0992 Supervision of high risk pregnancy, unspecified, second trimester: Secondary | ICD-10-CM

## 2015-12-01 LAB — POCT URINALYSIS DIP (DEVICE)
Bilirubin Urine: NEGATIVE
GLUCOSE, UA: NEGATIVE mg/dL
HGB URINE DIPSTICK: NEGATIVE
Leukocytes, UA: NEGATIVE
Nitrite: NEGATIVE
PH: 6 (ref 5.0–8.0)
PROTEIN: NEGATIVE mg/dL
SPECIFIC GRAVITY, URINE: 1.025 (ref 1.005–1.030)
UROBILINOGEN UA: 0.2 mg/dL (ref 0.0–1.0)

## 2015-12-01 NOTE — Progress Notes (Signed)
Subjective:  Ashley Pugh is a 35 y.o. [id number] at 4439w4d being seen today for ongoing prenatal care.  She is currently monitored for the following issues for this high-risk pregnancy and has Supervision of high risk pregnancy, antepartum; Previous cesarean section complicating pregnancy, antepartum condition or complication; AMA (advanced maternal age) multigravida 35+; H/O pre-eclampsia in prior pregnancy, currently pregnant; Chlamydia infection affecting pregnancy; and Gestational diabetes mellitus (GDM) affecting pregnancy on her problem list.  Patient reports no complaints.  Contractions: Not present. Vag. Bleeding: None.  Movement: Present. Denies leaking of fluid.   The following portions of the patient's history were reviewed and updated as appropriate: allergies, current medications, past family history, past medical history, past social history, past surgical history and problem list. Problem list updated.  Objective:   Filed Vitals:   12/01/15 1034  BP: 117/67  Pulse: 84  Weight: 254 lb 8 oz (115.44 kg)    Fetal Status: Fetal Heart Rate (bpm): 150   Movement: Present     General:  Alert, oriented and cooperative. Patient is in no acute distress.  Skin: Skin is warm and dry. No rash noted.   Cardiovascular: Normal heart rate noted  Respiratory: Normal respiratory effort, no problems with respiration noted  Abdomen: Soft, gravid, appropriate for gestational age. Pain/Pressure: Absent     Pelvic: Vag. Bleeding: None     Cervical exam deferred        Extremities: Normal range of motion.  Edema: None  Mental Status: Normal mood and affect. Normal behavior. Normal judgment and thought content.   Urinalysis:      Assessment and Plan:  Pregnancy: [id number] at 6339w4d  1. Gestational diabetes mellitus (GDM) affecting pregnancy Patient did not bring log book. She reports fasting in the 110's but she often eats in the middle of the night and pp 130's but often does not adhere to the  diet Patient advised to bring log book at next visit in order to help us determine if GDM needs to be managed with medication   2. Chlamydia infection affecting pregnancy TOC at next visit  3. AMA (advanced maternal age) multigravida 35+, second trimester Scheduled for anatomy and genetic counseling in April  4. H/O pre-eclampsia in prior pregnancy, currently pregnant, second trimester Continue daily ASA  5. Previous cesarean section complicating pregnancy, antepartum condition or complication Will be scheduled for repeat  6. Supervision of high risk pregnancy, antepartum, second trimester   General obstetric precautions including but not limited to vaginal bleeding, contractions, leaking of fluid and fetal movement were reviewed in detail with the patient. Please refer to After Visit Summary for other counseling recommendations.  No Follow-up on file.   Catalina AntiguaPeggy Corbet Hanley, MD

## 2015-12-02 ENCOUNTER — Encounter (HOSPITAL_COMMUNITY): Payer: Self-pay

## 2015-12-08 ENCOUNTER — Encounter (HOSPITAL_COMMUNITY): Payer: Self-pay | Admitting: *Deleted

## 2015-12-08 ENCOUNTER — Encounter (HOSPITAL_COMMUNITY): Payer: Medicaid Other

## 2015-12-08 ENCOUNTER — Inpatient Hospital Stay (HOSPITAL_COMMUNITY): Payer: Medicaid Other

## 2015-12-08 ENCOUNTER — Inpatient Hospital Stay (HOSPITAL_COMMUNITY)
Admission: AD | Admit: 2015-12-08 | Discharge: 2015-12-08 | Disposition: A | Discharge status: Home / Self Care | Payer: Medicaid Other | Source: Ambulatory Visit | Attending: Family Medicine | Admitting: Family Medicine

## 2015-12-08 DIAGNOSIS — IMO0002 Reserved for concepts with insufficient information to code with codable children: Secondary | ICD-10-CM

## 2015-12-08 DIAGNOSIS — O26892 Other specified pregnancy related conditions, second trimester: Secondary | ICD-10-CM | POA: Insufficient documentation

## 2015-12-08 DIAGNOSIS — Z3A15 15 weeks gestation of pregnancy: Secondary | ICD-10-CM | POA: Insufficient documentation

## 2015-12-08 DIAGNOSIS — Z7982 Long term (current) use of aspirin: Secondary | ICD-10-CM | POA: Diagnosis not present

## 2015-12-08 DIAGNOSIS — O99612 Diseases of the digestive system complicating pregnancy, second trimester: Secondary | ICD-10-CM

## 2015-12-08 DIAGNOSIS — K5900 Constipation, unspecified: Secondary | ICD-10-CM | POA: Diagnosis not present

## 2015-12-08 DIAGNOSIS — O24419 Gestational diabetes mellitus in pregnancy, unspecified control: Secondary | ICD-10-CM | POA: Diagnosis not present

## 2015-12-08 LAB — URINALYSIS, ROUTINE W REFLEX MICROSCOPIC
Bilirubin Urine: NEGATIVE
Glucose, UA: NEGATIVE mg/dL
Ketones, ur: 15 mg/dL — AB
LEUKOCYTES UA: NEGATIVE
NITRITE: NEGATIVE
PROTEIN: NEGATIVE mg/dL
pH: 5.5 (ref 5.0–8.0)

## 2015-12-08 LAB — CBC
HEMATOCRIT: 33.2 % — AB (ref 36.0–46.0)
HEMOGLOBIN: 11.2 g/dL — AB (ref 12.0–15.0)
MCH: 28.6 pg (ref 26.0–34.0)
MCHC: 33.7 g/dL (ref 30.0–36.0)
MCV: 84.7 fL (ref 78.0–100.0)
Platelets: 220 10*3/uL (ref 150–400)
RBC: 3.92 MIL/uL (ref 3.87–5.11)
RDW: 15.8 % — AB (ref 11.5–15.5)
WBC: 7 10*3/uL (ref 4.0–10.5)

## 2015-12-08 LAB — URINE MICROSCOPIC-ADD ON

## 2015-12-08 NOTE — Discharge Instructions (Signed)
Increase water & fiber intake. Drink enough water during the day that your urine is clear. You can take stool softeners (ex. Colace) daily per package instructions.   Constipation, Adult Constipation is when a person has fewer than three bowel movements a week, has difficulty having a bowel movement, or has stools that are dry, hard, or larger than normal. As people grow older, constipation is more common. A low-fiber diet, not taking in enough fluids, and taking certain medicines may make constipation worse.  CAUSES   Certain medicines, such as antidepressants, pain medicine, iron supplements, antacids, and water pills.   Certain diseases, such as diabetes, irritable bowel syndrome (IBS), thyroid disease, or depression.   Not drinking enough water.   Not eating enough fiber-rich foods.   Stress or travel.   Lack of physical activity or exercise.   Ignoring the urge to have a bowel movement.   Using laxatives too much.  SIGNS AND SYMPTOMS   Having fewer than three bowel movements a week.   Straining to have a bowel movement.   Having stools that are hard, dry, or larger than normal.   Feeling full or bloated.   Pain in the lower abdomen.   Not feeling relief after having a bowel movement.  DIAGNOSIS  Your health care provider will take a medical history and perform a physical exam. Further testing may be done for severe constipation. Some tests may include:  A barium enema X-ray to examine your rectum, colon, and, sometimes, your small intestine.   A sigmoidoscopy to examine your lower colon.   A colonoscopy to examine your entire colon. TREATMENT  Treatment will depend on the severity of your constipation and what is causing it. Some dietary treatments include drinking more fluids and eating more fiber-rich foods. Lifestyle treatments may include regular exercise. If these diet and lifestyle recommendations do not help, your health care provider may  recommend taking over-the-counter laxative medicines to help you have bowel movements. Prescription medicines may be prescribed if over-the-counter medicines do not work.  HOME CARE INSTRUCTIONS   Eat foods that have a lot of fiber, such as fruits, vegetables, whole grains, and beans.  Limit foods high in fat and processed sugars, such as french fries, hamburgers, cookies, candies, and soda.   A fiber supplement may be added to your diet if you cannot get enough fiber from foods.   Drink enough fluids to keep your urine clear or pale yellow.   Exercise regularly or as directed by your health care provider.   Go to the restroom when you have the urge to go. Do not hold it.   Only take over-the-counter or prescription medicines as directed by your health care provider. Do not take other medicines for constipation without talking to your health care provider first.  SEEK IMMEDIATE MEDICAL CARE IF:   You have bright red blood in your stool.   Your constipation lasts for more than 4 days or gets worse.   You have abdominal or rectal pain.   You have thin, pencil-like stools.   You have unexplained weight loss. MAKE SURE YOU:   Understand these instructions.  Will watch your condition.  Will get help right away if you are not doing well or get worse.   This information is not intended to replace advice given to you by your health care provider. Make sure you discuss any questions you have with your health care provider.   Document Released: 05/28/2004 Document Revised: 09/20/2014 Document Reviewed:  06/11/2013 Elsevier Interactive Patient Education 2016 Elsevier Inc.     High-Fiber Diet Fiber, also called dietary fiber, is a type of carbohydrate found in fruits, vegetables, whole grains, and beans. A high-fiber diet can have many health benefits. Your health care provider may recommend a high-fiber diet to help:  Prevent constipation. Fiber can make your bowel  movements more regular.  Lower your cholesterol.  Relieve hemorrhoids, uncomplicated diverticulosis, or irritable bowel syndrome.  Prevent overeating as part of a weight-loss plan.  Prevent heart disease, type 2 diabetes, and certain cancers. WHAT IS MY PLAN? The recommended daily intake of fiber includes:  38 grams for men under age 22.  30 grams for men over age 75.  25 grams for women under age 40.  21 grams for women over age 52. You can get the recommended daily intake of dietary fiber by eating a variety of fruits, vegetables, grains, and beans. Your health care provider may also recommend a fiber supplement if it is not possible to get enough fiber through your diet. WHAT DO I NEED TO KNOW ABOUT A HIGH-FIBER DIET?  Fiber supplements have not been widely studied for their effectiveness, so it is better to get fiber through food sources.  Always check the fiber content on thenutrition facts label of any prepackaged food. Look for foods that contain at least 5 grams of fiber per serving.  Ask your dietitian if you have questions about specific foods that are related to your condition, especially if those foods are not listed in the following section.  Increase your daily fiber consumption gradually. Increasing your intake of dietary fiber too quickly may cause bloating, cramping, or gas.  Drink plenty of water. Water helps you to digest fiber. WHAT FOODS CAN I EAT? Grains Whole-grain breads. Multigrain cereal. Oats and oatmeal. Brown rice. Barley. Bulgur wheat. Millet. Bran muffins. Popcorn. Rye wafer crackers. Vegetables Sweet potatoes. Spinach. Kale. Artichokes. Cabbage. Broccoli. Green peas. Carrots. Squash. Fruits Berries. Pears. Apples. Oranges. Avocados. Prunes and raisins. Dried figs. Meats and Other Protein Sources Navy, kidney, pinto, and soy beans. Split peas. Lentils. Nuts and seeds. Dairy Fiber-fortified yogurt. Beverages Fiber-fortified soy milk.  Fiber-fortified orange juice. Other Fiber bars. The items listed above may not be a complete list of recommended foods or beverages. Contact your dietitian for more options. WHAT FOODS ARE NOT RECOMMENDED? Grains White bread. Pasta made with refined flour. White rice. Vegetables Fried potatoes. Canned vegetables. Well-cooked vegetables.  Fruits Fruit juice. Cooked, strained fruit. Meats and Other Protein Sources Fatty cuts of meat. Fried Environmental education officer or fried fish. Dairy Milk. Yogurt. Cream cheese. Sour cream. Beverages Soft drinks. Other Cakes and pastries. Butter and oils. The items listed above may not be a complete list of foods and beverages to avoid. Contact your dietitian for more information. WHAT ARE SOME TIPS FOR INCLUDING HIGH-FIBER FOODS IN MY DIET?  Eat a wide variety of high-fiber foods.  Make sure that half of all grains consumed each day are whole grains.  Replace breads and cereals made from refined flour or white flour with whole-grain breads and cereals.  Replace white rice with brown rice, bulgur wheat, or millet.  Start the day with a breakfast that is high in fiber, such as a cereal that contains at least 5 grams of fiber per serving.  Use beans in place of meat in soups, salads, or pasta.  Eat high-fiber snacks, such as berries, raw vegetables, nuts, or popcorn.   This information is not intended to replace  advice given to you by your health care provider. Make sure you discuss any questions you have with your health care provider.   Document Released: 08/30/2005 Document Revised: 09/20/2014 Document Reviewed: 02/12/2014 Elsevier Interactive Patient Education [employer] Elsevier Inc.

## 2015-12-08 NOTE — MAU Provider Note (Signed)
History     CSN: [id number]  Arrival date and time: 12/08/15 1026   First Provider Initiated Contact with Patient 12/08/15 1059      Chief Complaint  Patient presents with  . Constipation   HPI  Wende NeighborsFarah Callow is a 35 y.o. [id number] at 7545w4d who presents for constipation. Symptoms began 2 weeks ago after switching prenatal vitamins. Reports infrequent hard stools. Had large bowel movement yesterday after sitting on toilet for 1 hour. Reports small amount bright red blood on toilet tissue after that BM.  Denies abdominal pain, vaginal bleeding, LOF, n/v, or fever.    OB History    Gravida Para Term Preterm AB TAB SAB Ectopic Multiple Living   5 2 1 1 2 1 1  0 0 2      Past Medical History  Diagnosis Date  . Hx of preeclampsia, prior pregnancy, currently pregnant     Past Surgical History  Procedure Laterality Date  . Cesarean section    . Breast reduction surgery      History reviewed. No pertinent family history.  Social History  Substance Use Topics  . Smoking status: Never Smoker   . Smokeless tobacco: Never Used  . Alcohol Use: No    Allergies: No Known Allergies  Prescriptions prior to admission  Medication Sig Dispense Refill Last Dose  . ACCU-CHEK FASTCLIX LANCETS MISC Inject 1 each into the skin 4 (four) times daily. New dx GDM O24.419 for testing 4 times daily 102 each 12 Taking  . aspirin 81 MG chewable tablet Chew 81 mg by mouth daily.   Taking  . azithromycin (ZITHROMAX) 250 MG tablet Take 4 tablets all at once for a total of 1 gram . (Patient not taking: Reported on 11/24/2015) 4 each 0 Not Taking  . glucose blood (ACCU-CHEK SMARTVIEW) test strip New DX GDM O24.419 for testing 4 times daily 100 each 12 Taking    Review of Systems  Constitutional: Negative.   Gastrointestinal: Positive for constipation. Negative for nausea, vomiting, abdominal pain, diarrhea, blood in stool and melena.  Genitourinary: Negative.    Physical Exam   Blood pressure  119/78, pulse 89, temperature 98.4 F (36.9 C), temperature source Oral, resp. rate 20, height 5\' 4"  (1.626 m), weight 254 lb (115.214 kg), last menstrual period 07/18/2015.  Physical Exam  Nursing note and vitals reviewed. Constitutional: She is oriented to person, place, and time. She appears well-developed and well-nourished. No distress.  HENT:  Head: Normocephalic and atraumatic.  Eyes: Conjunctivae are normal. Right eye exhibits no discharge. Left eye exhibits no discharge. No scleral icterus.  Neck: Normal range of motion.  Cardiovascular: Normal rate, regular rhythm and normal heart sounds.   No murmur heard. Respiratory: Effort normal and breath sounds normal. No respiratory distress. She has no wheezes.  GI: Soft. Bowel sounds are normal. There is no tenderness.  Genitourinary: Rectum normal.  Neurological: She is alert and oriented to person, place, and time.  Skin: Skin is warm and dry. She is not diaphoretic.  Psychiatric: She has a normal mood and affect. Her behavior is normal. Judgment and thought content normal.    MAU Course  Procedures Results for orders placed or performed during the hospital encounter of 12/08/15 (from the past 24 hour(s))  Urinalysis, Routine w reflex microscopic ([facility])     Status: Abnormal   Collection Time: 12/08/15 10:38 AM  Result Value Ref Range   Color, Urine YELLOW YELLOW   APPearance CLEAR CLEAR   Specific  Gravity, Urine >1.030 (H) 1.005 - 1.030   pH 5.5 5.0 - 8.0   Glucose, UA NEGATIVE NEGATIVE mg/dL   Hgb urine dipstick TRACE (A) NEGATIVE   Bilirubin Urine NEGATIVE NEGATIVE   Ketones, ur 15 (A) NEGATIVE mg/dL   Protein, ur NEGATIVE NEGATIVE mg/dL   Nitrite NEGATIVE NEGATIVE   Leukocytes, UA NEGATIVE NEGATIVE  Urine microscopic-add on     Status: Abnormal   Collection Time: 12/08/15 10:38 AM  Result Value Ref Range   Squamous Epithelial / LPF 0-5 (A) NONE SEEN   WBC, UA 0-5 0 - 5 WBC/hpf   RBC / HPF 0-5 0 - 5 RBC/hpf    Bacteria, UA FEW (A) NONE SEEN   Urine-Other MUCOUS PRESENT   CBC     Status: Abnormal   Collection Time: 12/08/15 11:16 AM  Result Value Ref Range   WBC 7.0 4.0 - 10.5 K/uL   RBC 3.92 3.87 - 5.11 MIL/uL   Hemoglobin 11.2 (L) 12.0 - 15.0 g/dL   HCT 21.3 (L) 08.6 - 57.8 %   MCV 84.7 78.0 - 100.0 fL   MCH 28.6 26.0 - 34.0 pg   MCHC 33.7 30.0 - 36.0 g/dL   RDW 46.9 (H) 62.9 - 52.8 %   Platelets 220 150 - 400 K/uL    MDM Unable to doppler FHT - likely due to body habitus, will send for utlrasound Per ultrasound, FHT 150  Assessment and Plan  A: 1. Constipation, unspecified constipation type   2. Fetal heart tones not heard   3. [redacted] weeks gestation of pregnancy     P: Discharge home Increase water & dietary fiber - info given for high fiber diet Take stool softeners daily Discussed reasons to return  Judeth Horn 12/08/2015, 10:59 AM

## 2015-12-08 NOTE — MAU Note (Signed)
Patient presents at [redacted] weeks gestation with c/o fatigue since yesterday and constipation X 2 weeks. Denies Bleeding or discharge.

## 2015-12-15 ENCOUNTER — Other Ambulatory Visit (HOSPITAL_COMMUNITY)
Admission: RE | Admit: 2015-12-15 | Discharge: 2015-12-15 | Disposition: A | Discharge status: Home / Self Care | Payer: Medicaid Other | Source: Ambulatory Visit | Attending: Family Medicine | Admitting: Family Medicine

## 2015-12-15 ENCOUNTER — Ambulatory Visit (INDEPENDENT_AMBULATORY_CARE_PROVIDER_SITE_OTHER): Payer: Medicaid Other | Admitting: Family Medicine

## 2015-12-15 VITALS — BP 113/66 | HR 75 | Temp 98.4°F | Wt 256.3 lb

## 2015-12-15 DIAGNOSIS — O24419 Gestational diabetes mellitus in pregnancy, unspecified control: Secondary | ICD-10-CM

## 2015-12-15 DIAGNOSIS — O34219 Maternal care for unspecified type scar from previous cesarean delivery: Secondary | ICD-10-CM

## 2015-12-15 DIAGNOSIS — O98319 Other infections with a predominantly sexual mode of transmission complicating pregnancy, unspecified trimester: Secondary | ICD-10-CM

## 2015-12-15 DIAGNOSIS — O0992 Supervision of high risk pregnancy, unspecified, second trimester: Secondary | ICD-10-CM

## 2015-12-15 DIAGNOSIS — O09522 Supervision of elderly multigravida, second trimester: Secondary | ICD-10-CM | POA: Diagnosis not present

## 2015-12-15 DIAGNOSIS — Z113 Encounter for screening for infections with a predominantly sexual mode of transmission: Secondary | ICD-10-CM | POA: Insufficient documentation

## 2015-12-15 DIAGNOSIS — O98819 Other maternal infectious and parasitic diseases complicating pregnancy, unspecified trimester: Secondary | ICD-10-CM

## 2015-12-15 DIAGNOSIS — A749 Chlamydial infection, unspecified: Secondary | ICD-10-CM

## 2015-12-15 DIAGNOSIS — O09292 Supervision of pregnancy with other poor reproductive or obstetric history, second trimester: Secondary | ICD-10-CM

## 2015-12-15 LAB — POCT URINALYSIS DIP (DEVICE)
BILIRUBIN URINE: NEGATIVE
GLUCOSE, UA: NEGATIVE mg/dL
Ketones, ur: 15 mg/dL — AB
LEUKOCYTES UA: NEGATIVE
NITRITE: NEGATIVE
Protein, ur: NEGATIVE mg/dL
UROBILINOGEN UA: 0.2 mg/dL (ref 0.0–1.0)
pH: 5.5 (ref 5.0–8.0)

## 2015-12-15 MED ORDER — GLYBURIDE 5 MG PO TABS: 5.0000 mg | ORAL_TABLET | Freq: Two times a day (BID) | ORAL | Status: DC

## 2015-12-15 NOTE — Progress Notes (Signed)
Urine: trace blood, 15 ketones

## 2015-12-15 NOTE — Progress Notes (Signed)
Breastfeeding tip of the week reviewed. 

## 2015-12-15 NOTE — Progress Notes (Signed)
Subjective:  Ashley Pugh is a 35 y.o. [id number] at 3738w4d being seen today for ongoing prenatal care.  She is currently monitored for the following issues for this high-risk pregnancy and has Supervision of high risk pregnancy, antepartum; Previous cesarean section complicating pregnancy, antepartum condition or complication; AMA (advanced maternal age) multigravida 35+; H/O pre-eclampsia in prior pregnancy, currently pregnant; Chlamydia infection affecting pregnancy; and Gestational diabetes mellitus (GDM) affecting pregnancy on her problem list.  Patient reports no complaints.  Contractions: Not present. Vag. Bleeding: None.  Movement: Present. Denies leaking of fluid.   The following portions of the patient's history were reviewed and updated as appropriate: allergies, current medications, past family history, past medical history, past social history, past surgical history and problem list. Problem list updated.  Objective:   Filed Vitals:   12/15/15 1102  BP: 113/66  Pulse: 75  Temp: 98.4 F (36.9 C)  Weight: 256 lb 4.8 oz (116.257 kg)    Fetal Status:     Movement: Present     General:  Alert, oriented and cooperative. Patient is in no acute distress.  Skin: Skin is warm and dry. No rash noted.   Cardiovascular: Normal heart rate noted  Respiratory: Normal respiratory effort, no problems with respiration noted  Abdomen: Soft, gravid, appropriate for gestational age. Pain/Pressure: Absent     Pelvic: Vag. Bleeding: None     Cervical exam deferred        Extremities: Normal range of motion.  Edema: None  Mental Status: Normal mood and affect. Normal behavior. Normal judgment and thought content.   Urinalysis:       Fasting 151, 123, 103, 127 bfst 98, 111 lun Din 133, 124, 195  Assessment and Plan:  Pregnancy: [id number] at 2138w4d  1. Chlamydia infection affecting pregnancy - Completed medication in early march. Needs TOC today - GC/Chlamydia probe amp [facility]  2. AMA (advanced maternal age) multigravida 35+, second trimester  3. Gestational diabetes mellitus (GDM) affecting pregnancy Reviewed BS log which shows high BG values although incomplete 100% of values both fasting and postprandial are above range - Start Glyburide 5mg  BID -instructed that if BS are loo low (<60) consistently can reduce to 2.5mg  (1/2 tab) BID - get baseline labs at next visit when here to see DM Ed  4. H/O pre-eclampsia in prior pregnancy, currently pregnant, second trimester -continue ASA  5. Previous cesarean section complicating pregnancy, antepartum condition or complication -Desires elective repeat  6. Supervision of high risk pregnancy, antepartum, second trimester Updated box  Preterm labor symptoms and general obstetric precautions including but not limited to vaginal bleeding, contractions, leaking of fluid and fetal movement were reviewed in detail with the patient. Please refer to After Visit Summary for other counseling recommendations.  Return in about 4 weeks (around 01/12/2016).   Federico FlakeKimberly Niles Newton, MD

## 2015-12-15 NOTE — Patient Instructions (Signed)
Diabetes Mellitus and Food It is important for you to manage your blood sugar (glucose) level. Your blood glucose level can be greatly affected by what you eat. Eating healthier foods in the appropriate amounts throughout the day at about the same time each day will help you control your blood glucose level. It can also help slow or prevent worsening of your diabetes mellitus. Healthy eating may even help you improve the level of your blood pressure and reach or maintain a healthy weight.  General recommendations for healthful eating and cooking habits include:  Eating meals and snacks regularly. Avoid going long periods of time without eating to lose weight.  Eating a diet that consists mainly of plant-based foods, such as fruits, vegetables, nuts, legumes, and whole grains.  Using low-heat cooking methods, such as baking, instead of high-heat cooking methods, such as deep frying. Work with your dietitian to make sure you understand how to use the Nutrition Facts information on food labels. HOW CAN FOOD AFFECT ME? Carbohydrates Carbohydrates affect your blood glucose level more than any other type of food. Your dietitian will help you determine how many carbohydrates to eat at each meal and teach you how to count carbohydrates. Counting carbohydrates is important to keep your blood glucose at a healthy level, especially if you are using insulin or taking certain medicines for diabetes mellitus. Alcohol Alcohol can cause sudden decreases in blood glucose (hypoglycemia), especially if you use insulin or take certain medicines for diabetes mellitus. Hypoglycemia can be a life-threatening condition. Symptoms of hypoglycemia (sleepiness, dizziness, and disorientation) are similar to symptoms of having too much alcohol.  If your health care provider has given you approval to drink alcohol, do so in moderation and use the following guidelines:  Women should not have more than one drink per day, and men  should not have more than two drinks per day. One drink is equal to:  12 oz of beer.  5 oz of wine.  1 oz of hard liquor.  Do not drink on an empty stomach.  Keep yourself hydrated. Have water, diet soda, or unsweetened iced tea.  Regular soda, juice, and other mixers might contain a lot of carbohydrates and should be counted. WHAT FOODS ARE NOT RECOMMENDED? As you make food choices, it is important to remember that all foods are not the same. Some foods have fewer nutrients per serving than other foods, even though they might have the same number of calories or carbohydrates. It is difficult to get your body what it needs when you eat foods with fewer nutrients. Examples of foods that you should avoid that are high in calories and carbohydrates but low in nutrients include:  Trans fats (most processed foods list trans fats on the Nutrition Facts label).  Regular soda.  Juice.  Candy.  Sweets, such as cake, pie, doughnuts, and cookies.  Fried foods. WHAT FOODS CAN I EAT? Eat nutrient-rich foods, which will nourish your body and keep you healthy. The food you should eat also will depend on several factors, including:  The calories you need.  The medicines you take.  Your weight.  Your blood glucose level.  Your blood pressure level.  Your cholesterol level. You should eat a variety of foods, including:  Protein.  Lean cuts of meat.  Proteins low in saturated fats, such as fish, egg whites, and beans. Avoid processed meats.  Fruits and vegetables.  Fruits and vegetables that may help control blood glucose levels, such as apples, mangoes, and   yams.  Dairy products.  Choose fat-free or low-fat dairy products, such as milk, yogurt, and cheese.  Grains, bread, pasta, and rice.  Choose whole grain products, such as multigrain bread, whole oats, and brown rice. These foods may help control blood pressure.  Fats.  Foods containing healthful fats, such as nuts,  avocado, olive oil, canola oil, and fish. DOES EVERYONE WITH DIABETES MELLITUS HAVE THE SAME MEAL PLAN? Because every person with diabetes mellitus is different, there is not one meal plan that works for everyone. It is very important that you meet with a dietitian who will help you create a meal plan that is just right for you.   This information is not intended to replace advice given to you by your health care provider. Make sure you discuss any questions you have with your health care provider.   Document Released: 05/27/2005 Document Revised: 09/20/2014 Document Reviewed: 07/27/2013 Elsevier Interactive Patient Education 2016 Elsevier Inc.  

## 2015-12-16 ENCOUNTER — Encounter: Payer: Medicaid Other | Admitting: Family

## 2015-12-16 LAB — GC/CHLAMYDIA PROBE AMP (~~LOC~~) NOT AT ARMC
CHLAMYDIA, DNA PROBE: NEGATIVE
NEISSERIA GONORRHEA: NEGATIVE

## 2015-12-16 LAB — PROTEIN / CREATININE RATIO, URINE
CREATININE, URINE: 206 mg/dL (ref 20–320)
PROTEIN CREATININE RATIO: 117 mg/g{creat} (ref 21–161)
TOTAL PROTEIN, URINE: 24 mg/dL (ref 5–24)

## 2015-12-18 ENCOUNTER — Encounter (HOSPITAL_COMMUNITY): Payer: Self-pay | Admitting: Obstetrics & Gynecology

## 2015-12-22 ENCOUNTER — Ambulatory Visit: Payer: Medicaid Other | Admitting: Obstetrics & Gynecology

## 2015-12-22 ENCOUNTER — Encounter: Payer: Self-pay | Admitting: *Deleted

## 2015-12-22 ENCOUNTER — Encounter: Payer: BLUE CROSS/BLUE SHIELD | Attending: Family Medicine | Admitting: *Deleted

## 2015-12-22 DIAGNOSIS — O24419 Gestational diabetes mellitus in pregnancy, unspecified control: Secondary | ICD-10-CM

## 2015-12-22 DIAGNOSIS — Z029 Encounter for administrative examinations, unspecified: Secondary | ICD-10-CM | POA: Insufficient documentation

## 2015-12-22 LAB — HEMOGLOBIN A1C
Hgb A1c MFr Bld: 5.8 % — ABNORMAL HIGH
Mean Plasma Glucose: 120 mg/dL

## 2015-12-22 LAB — BASIC METABOLIC PANEL WITH GFR
BUN: 6 mg/dL — ABNORMAL LOW (ref 7–25)
CO2: 22 mmol/L (ref 20–31)
Calcium: 9 mg/dL (ref 8.6–10.2)
Chloride: 104 mmol/L (ref 98–110)
Creat: 0.42 mg/dL — ABNORMAL LOW (ref 0.50–1.10)
Glucose, Bld: 142 mg/dL — ABNORMAL HIGH (ref 65–99)
Potassium: 3.8 mmol/L (ref 3.5–5.3)
Sodium: 136 mmol/L (ref 135–146)

## 2015-12-22 LAB — TSH: TSH: 1.73 m[IU]/L

## 2015-12-22 NOTE — Progress Notes (Signed)
Patient presents for review of glucose readings. When initially starting taking glyburtide 5mg  BID experienced hypoglycemia. Has cut tablets in half per Dr. Alvester MorinNewton. Readings  FBS 88-124, 2hpp 84-138mg /dl. Will not make any changes in medication at this time. Remains on 2.5mg  BID

## 2015-12-29 ENCOUNTER — Encounter: Payer: Self-pay | Admitting: Obstetrics and Gynecology

## 2015-12-29 ENCOUNTER — Ambulatory Visit (HOSPITAL_COMMUNITY)
Admission: RE | Admit: 2015-12-29 | Discharge: 2015-12-29 | Disposition: A | Discharge status: Home / Self Care | Payer: Medicaid Other | Source: Ambulatory Visit | Attending: Obstetrics and Gynecology | Admitting: Obstetrics and Gynecology

## 2015-12-29 ENCOUNTER — Ambulatory Visit (INDEPENDENT_AMBULATORY_CARE_PROVIDER_SITE_OTHER): Payer: BLUE CROSS/BLUE SHIELD | Admitting: Obstetrics and Gynecology

## 2015-12-29 ENCOUNTER — Encounter (HOSPITAL_COMMUNITY): Payer: Self-pay

## 2015-12-29 ENCOUNTER — Ambulatory Visit (HOSPITAL_COMMUNITY): Admission: RE | Admit: 2015-12-29 | Payer: Medicaid Other | Source: Ambulatory Visit

## 2015-12-29 ENCOUNTER — Other Ambulatory Visit (HOSPITAL_COMMUNITY): Payer: Self-pay | Admitting: Maternal and Fetal Medicine

## 2015-12-29 VITALS — BP 129/79 | HR 89 | Temp 98.3°F | Wt 259.6 lb

## 2015-12-29 DIAGNOSIS — O24419 Gestational diabetes mellitus in pregnancy, unspecified control: Secondary | ICD-10-CM

## 2015-12-29 DIAGNOSIS — O09292 Supervision of pregnancy with other poor reproductive or obstetric history, second trimester: Secondary | ICD-10-CM | POA: Diagnosis not present

## 2015-12-29 DIAGNOSIS — O09529 Supervision of elderly multigravida, unspecified trimester: Secondary | ICD-10-CM

## 2015-12-29 DIAGNOSIS — Z36 Encounter for antenatal screening of mother: Secondary | ICD-10-CM | POA: Insufficient documentation

## 2015-12-29 DIAGNOSIS — O99212 Obesity complicating pregnancy, second trimester: Secondary | ICD-10-CM | POA: Insufficient documentation

## 2015-12-29 DIAGNOSIS — O24415 Gestational diabetes mellitus in pregnancy, controlled by oral hypoglycemic drugs: Secondary | ICD-10-CM

## 2015-12-29 DIAGNOSIS — A749 Chlamydial infection, unspecified: Secondary | ICD-10-CM | POA: Diagnosis not present

## 2015-12-29 DIAGNOSIS — Z3689 Encounter for other specified antenatal screening: Secondary | ICD-10-CM

## 2015-12-29 DIAGNOSIS — O98319 Other infections with a predominantly sexual mode of transmission complicating pregnancy, unspecified trimester: Secondary | ICD-10-CM | POA: Diagnosis not present

## 2015-12-29 DIAGNOSIS — O09522 Supervision of elderly multigravida, second trimester: Secondary | ICD-10-CM

## 2015-12-29 DIAGNOSIS — Z3A18 18 weeks gestation of pregnancy: Secondary | ICD-10-CM | POA: Diagnosis not present

## 2015-12-29 DIAGNOSIS — O0992 Supervision of high risk pregnancy, unspecified, second trimester: Secondary | ICD-10-CM

## 2015-12-29 DIAGNOSIS — O98819 Other maternal infectious and parasitic diseases complicating pregnancy, unspecified trimester: Secondary | ICD-10-CM

## 2015-12-29 DIAGNOSIS — O34219 Maternal care for unspecified type scar from previous cesarean delivery: Secondary | ICD-10-CM

## 2015-12-29 HISTORY — DX: Gestational diabetes mellitus in pregnancy, unspecified control: O24.419

## 2015-12-29 LAB — POCT URINALYSIS DIP (DEVICE)
BILIRUBIN URINE: NEGATIVE
Glucose, UA: NEGATIVE mg/dL
HGB URINE DIPSTICK: NEGATIVE
KETONES UR: 40 mg/dL — AB
Leukocytes, UA: NEGATIVE
Nitrite: NEGATIVE
PH: 6 (ref 5.0–8.0)
Protein, ur: NEGATIVE mg/dL
Specific Gravity, Urine: 1.02 (ref 1.005–1.030)
Urobilinogen, UA: 0.2 mg/dL (ref 0.0–1.0)

## 2015-12-29 IMAGING — US US MFM OB DETAIL+14 WK
1 series · 13 of 28 positions shown · non-contrast
Comparison: none

[Series 1: us mfm ob detail+14 wk · 13 of 68 slices shown]
[im 3/68]
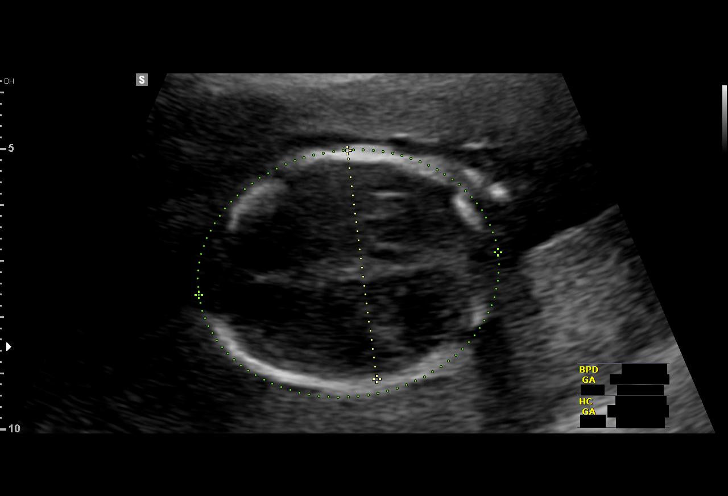
[im 8/68]
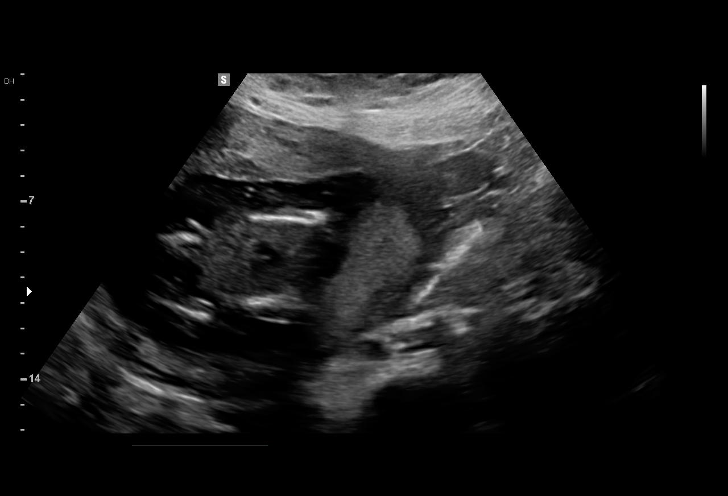
[im 13/68]
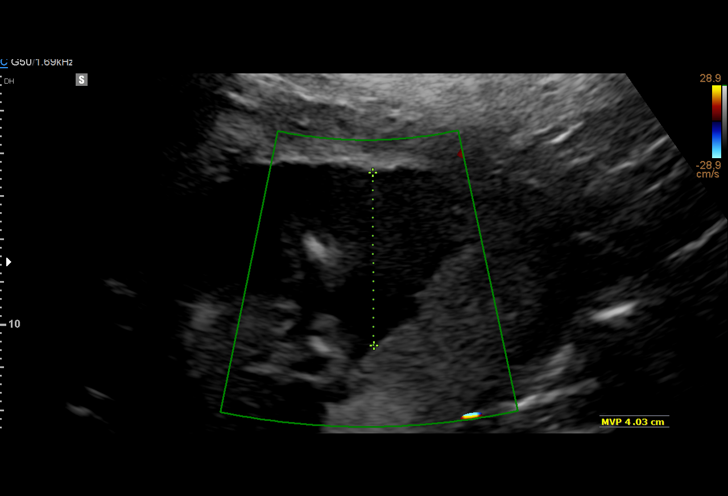
[im 18/68]
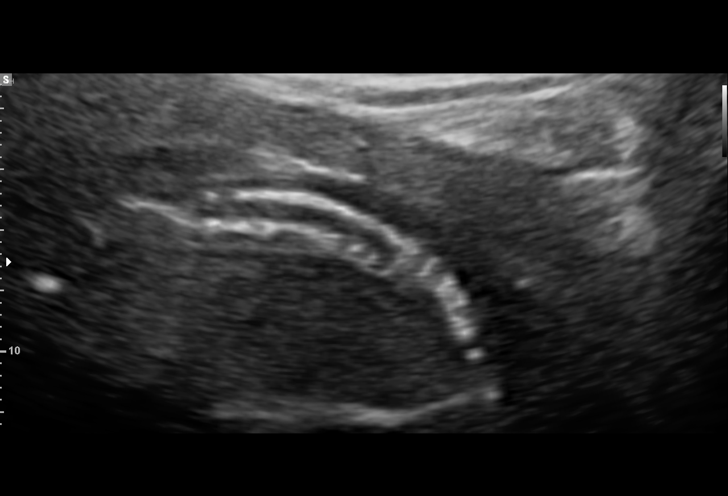
[im 23/68]
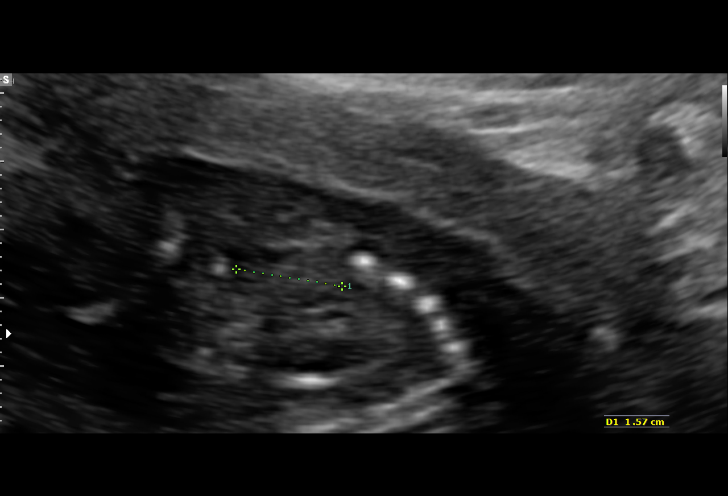
[im 28/68]
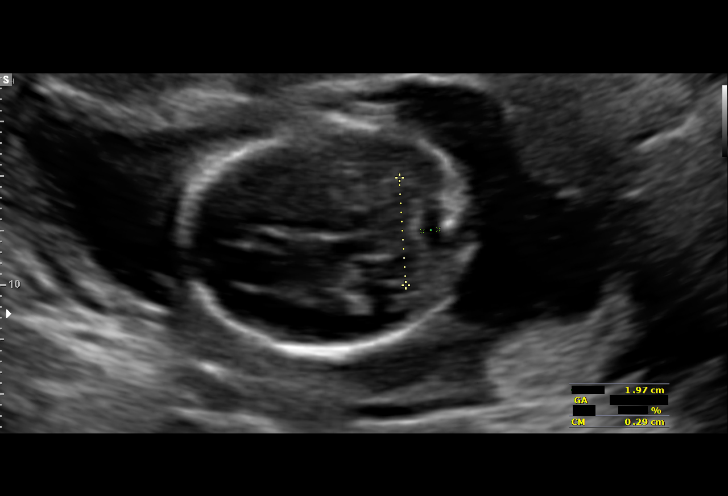
[im 35/68]
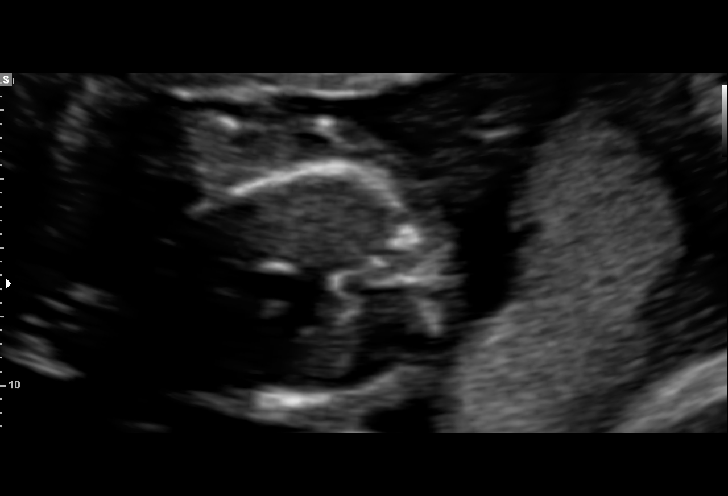
[im 40/68]
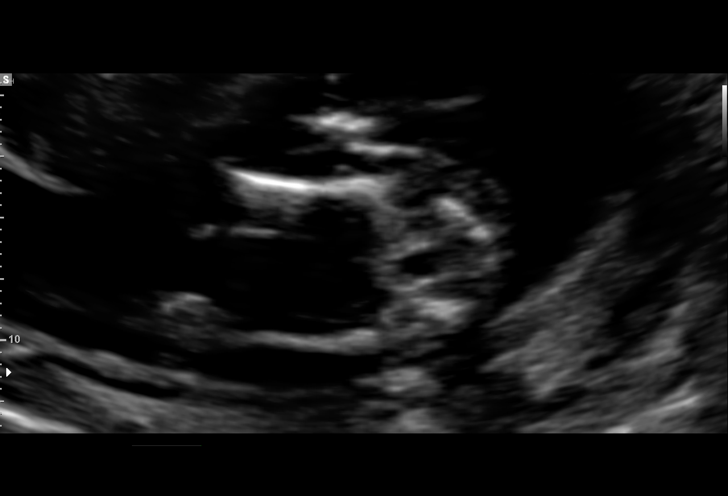
[im 45/68]
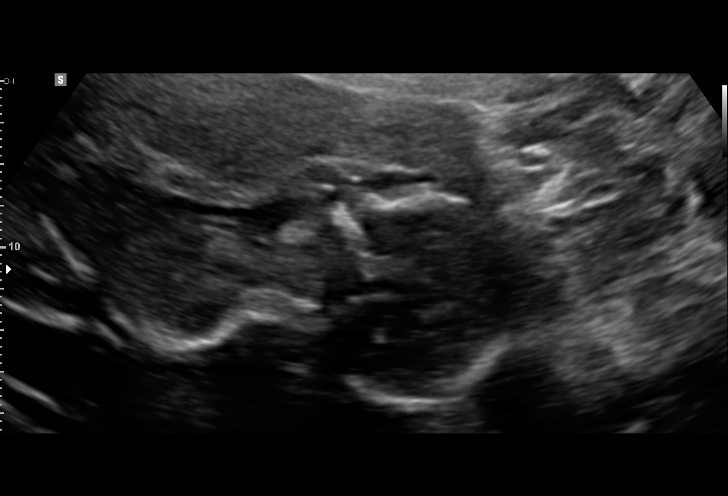
[im 50/68]
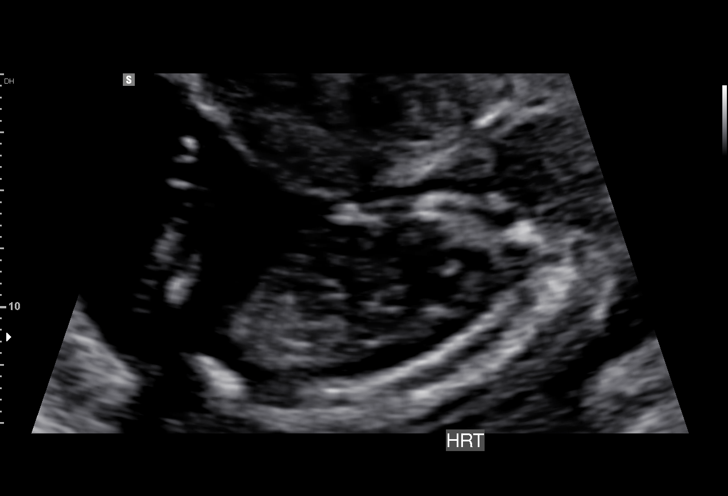
[im 55/68]
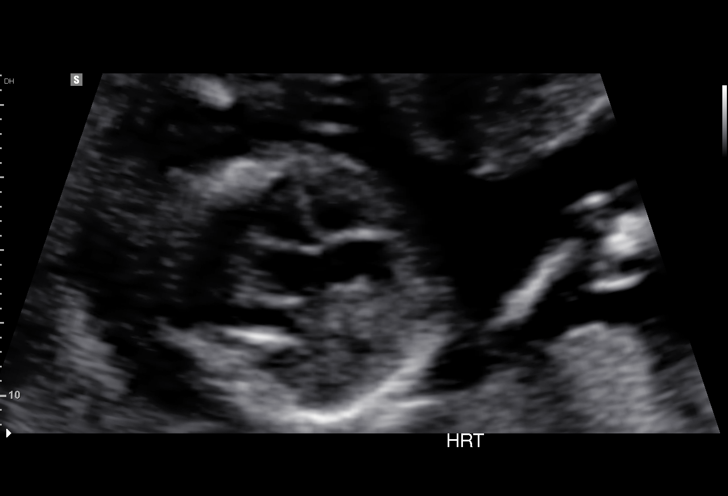
[im 60/68]
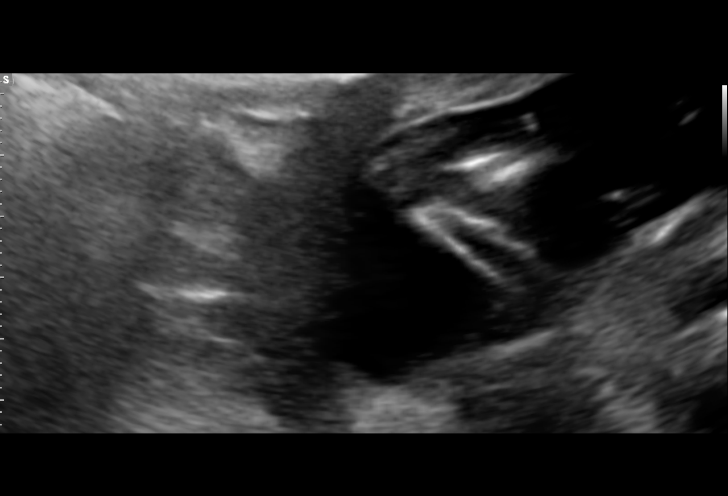
[im 65/68]
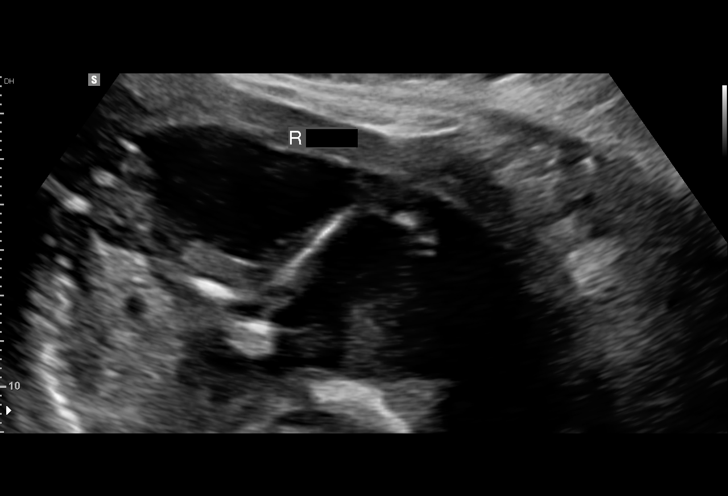

[13 of 28 positions shown; findings below may reference images not displayed]

Hospital Clinic-
Faculty Physician
OB/Gyn Clinic
[REDACTED]

1  DANITHZA MARPARTIDA            [id number]      [id number]     [id number]
Indications

18 weeks gestation of pregnancy
Detailed fetal anatomic survey                 Z36
Gestational diabetes in pregnancy,
controlled by oral hypoglycemic drugs
(glyburide); A2/B
Advanced maternal age multigravida 35+,
second trimester
Obesity complicating pregnancy, second
trimester
Poor obstetric history: Previous
preeclampsia / eclampsia/gestational HTN
OB History

Gravidity:    5         Term:   2        Prem:   0        SAB:   1
TOP:          1       Ectopic:  0        Living: 2
Fetal Evaluation

Num Of Fetuses:     1
Fetal Heart         145
Rate(bpm):
Cardiac Activity:   Observed
Presentation:       Cephalic
Placenta:           Posterior, above cervical os
P. Cord Insertion:  Marginal insertion on left

Amniotic Fluid
AFI FV:      Subjectively within normal limits
Larg Pckt:     4.0  cm
Biometry

BPD:      40.8  mm     G. Age:  18w 2d                  CI:         72.1   %   70 - 86
FL/HC:      18.2   %   16.1 -
HC:      152.9  mm     G. Age:  18w 2d         29  %    HC/AC:      1.11       1.09 -
AC:      137.7  mm     G. Age:  19w 1d         67  %    FL/BPD:     68.4   %
FL:       27.9  mm     G. Age:  18w 4d         43  %    FL/AC:      20.3   %   20 - 24
HUM:      28.9  mm     G. Age:  19w 3d         75  %
CER:      19.7  mm     G. Age:  18w 6d         60  %
NFT:       3.7  mm
CM:        2.9  mm

Est. FW:     259  gm      0 lb 9 oz     51  %
Gestational Age

LMP:           23w 3d       Date:   07/18/15                 EDD:   04/23/16
U/S Today:     18w 4d                                        EDD:   05/27/16
Best:          18w 4d    Det. By:   Early Ultrasound         EDD:   05/27/16
(10/08/15)
Anatomy

Cranium:          Appears normal         Aortic Arch:      Appears normal
Fetal Cavum:      Appears normal         Ductal Arch:      Appears normal
Ventricles:       Appears normal         Diaphragm:        Appears normal
Choroid Plexus:   Appears normal         Stomach:          Appears normal, left
sided
Cerebellum:       Appears normal         Abdomen:          Appears normal
Posterior Fossa:  Appears normal         Abdominal Wall:   Appears nml (cord
insert, abd wall)
Nuchal Fold:      Appears normal         Cord Vessels:     Appears normal (3
vessel cord)
Face:             Profile nl; orbits not Kidneys:          Appear normal
well visualized
Lips:             Not well visualized    Bladder:          Appears normal
Fetal Thoracic:   Appears normal         Spine:            Appears normal
Heart:            Appears normal         Upper             Appears normal
(4CH, axis, and        Extremities:
situs)
RVOT:             Appears normal         Lower             Appears normal
Extremities:
LVOT:             Not well visualized

Other:  Parents do not wish to know sex of fetus at this time. Heels and 5th
digit visualized. Nasal bone visualized.
Cervix Uterus Adnexa

Cervix
Length:            5.3  cm.
Normal appearance by transabdominal scan.

Adnexa:       No abnormality visualized.
Impression

SIUP at 18+4 weeks
Normal detailed fetal anatomy; limited views of face and LVOT
Markers of aneuploidy: none
Normal amniotic fluid volume
Measurements consistent with prior US

The US findings were shared with Ms. Maokanyane. She
declined genetic counseling and does not desire aneuploidy
screening/testing.
Recommendations

Follow-up ultrasound in 6 weeks to complete anatomy survey
and assess growth

## 2015-12-29 NOTE — Progress Notes (Signed)
Subjective:  Ashley Pugh is a 35 y.o. [id number] at 8088w4d being seen today for ongoing prenatal care.  She is currently monitored for the following issues for this high-risk pregnancy and has Supervision of high risk pregnancy, antepartum; Previous cesarean section complicating pregnancy, antepartum condition or complication; AMA (advanced maternal age) multigravida 35+; H/O pre-eclampsia in prior pregnancy, currently pregnant; Chlamydia infection affecting pregnancy; and Gestational diabetes mellitus (GDM) affecting pregnancy on her problem list.  Patient reports no complaints.  Contractions: Not present. Vag. Bleeding: None.  Movement: Present. Denies leaking of fluid.   The following portions of the patient's history were reviewed and updated as appropriate: allergies, current medications, past family history, past medical history, past social history, past surgical history and problem list. Problem list updated.  Objective:   Filed Vitals:   12/29/15 0817  BP: 129/79  Pulse: 89  Temp: 98.3 F (36.8 C)  Weight: 259 lb 9.6 oz (117.754 kg)    Fetal Status: Fetal Heart Rate (bpm): 150   Movement: Present     General:  Alert, oriented and cooperative. Patient is in no acute distress.  Skin: Skin is warm and dry. No rash noted.   Cardiovascular: Normal heart rate noted  Respiratory: Normal respiratory effort, no problems with respiration noted  Abdomen: Soft, gravid, appropriate for gestational age. Pain/Pressure: Present     Pelvic: Vag. Bleeding: None     Cervical exam deferred        Extremities: Normal range of motion.  Edema: None  Mental Status: Normal mood and affect. Normal behavior. Normal judgment and thought content.   Urinalysis: Urine Protein: Negative Urine Glucose: Negative  Assessment and Plan:  Pregnancy: [id number] at 7788w4d  1. Previous cesarean section complicating pregnancy, antepartum condition or complication Will be scheduled for repeat at 39 weeks  2.  Gestational diabetes mellitus (GDM) affecting pregnancy - Currently on glyburide 2.5 mg BID - CBGs reviewed and all within range except for fasting. All fasting values 140's-160's. These are not true fasting values as the patient consumes a meal around 1 am. She plans on modifying her schedule in order to prevent eating in the middle of the night - No change in current glyburide regimen  3. Chlamydia infection affecting pregnancy TOC negative  4. AMA (advanced maternal age) multigravida 35+, second trimester   5. H/O pre-eclampsia in prior pregnancy, currently pregnant, second trimester Continue ASA  6. Supervision of high risk pregnancy, antepartum, second trimester Follow up anatomy ultrasound today  General obstetric precautions including but not limited to vaginal bleeding, contractions, leaking of fluid and fetal movement were reviewed in detail with the patient. Please refer to After Visit Summary for other counseling recommendations.  Return in about 3 weeks (around 01/19/2016).   Catalina AntiguaPeggy Trachelle Low, MD

## 2015-12-29 NOTE — Progress Notes (Signed)
Patient will schedule ophthalmology appointment Fetal echo needs to be scheduled

## 2016-01-19 ENCOUNTER — Encounter: Payer: Self-pay | Admitting: Obstetrics and Gynecology

## 2016-01-19 ENCOUNTER — Ambulatory Visit (INDEPENDENT_AMBULATORY_CARE_PROVIDER_SITE_OTHER): Payer: BLUE CROSS/BLUE SHIELD | Admitting: Obstetrics and Gynecology

## 2016-01-19 VITALS — BP 125/68 | HR 91 | Wt 259.0 lb

## 2016-01-19 DIAGNOSIS — O24419 Gestational diabetes mellitus in pregnancy, unspecified control: Secondary | ICD-10-CM

## 2016-01-19 DIAGNOSIS — A749 Chlamydial infection, unspecified: Secondary | ICD-10-CM

## 2016-01-19 DIAGNOSIS — O34219 Maternal care for unspecified type scar from previous cesarean delivery: Secondary | ICD-10-CM

## 2016-01-19 DIAGNOSIS — O09522 Supervision of elderly multigravida, second trimester: Secondary | ICD-10-CM

## 2016-01-19 DIAGNOSIS — O09292 Supervision of pregnancy with other poor reproductive or obstetric history, second trimester: Secondary | ICD-10-CM

## 2016-01-19 DIAGNOSIS — O98312 Other infections with a predominantly sexual mode of transmission complicating pregnancy, second trimester: Secondary | ICD-10-CM

## 2016-01-19 DIAGNOSIS — O0992 Supervision of high risk pregnancy, unspecified, second trimester: Secondary | ICD-10-CM

## 2016-01-19 DIAGNOSIS — O98819 Other maternal infectious and parasitic diseases complicating pregnancy, unspecified trimester: Secondary | ICD-10-CM

## 2016-01-19 LAB — POCT URINALYSIS DIP (DEVICE)
Bilirubin Urine: NEGATIVE
Glucose, UA: NEGATIVE mg/dL
HGB URINE DIPSTICK: NEGATIVE
Ketones, ur: 15 mg/dL — AB
Leukocytes, UA: NEGATIVE
Nitrite: NEGATIVE
PH: 5.5 (ref 5.0–8.0)
PROTEIN: NEGATIVE mg/dL
SPECIFIC GRAVITY, URINE: 1.02 (ref 1.005–1.030)
UROBILINOGEN UA: 1 mg/dL (ref 0.0–1.0)

## 2016-01-19 NOTE — Progress Notes (Signed)
Pt is requesting a work note for frequent trips to restroom.

## 2016-01-19 NOTE — Progress Notes (Signed)
Subjective:  Ashley Pugh is a 35 y.o. [id number] at 7858w4d being seen today for ongoing prenatal care.  She is currently monitored for the following issues for this high-risk pregnancy and has Supervision of high risk pregnancy, antepartum; Previous cesarean section complicating pregnancy, antepartum condition or complication; AMA (advanced maternal age) multigravida 35+; H/O pre-eclampsia in prior pregnancy, currently pregnant; Chlamydia infection affecting pregnancy; and Gestational diabetes mellitus (GDM) affecting pregnancy on her problem list.  Patient reports no complaints.  Contractions: Not present. Vag. Bleeding: None.  Movement: Present. Denies leaking of fluid.   The following portions of the patient's history were reviewed and updated as appropriate: allergies, current medications, past family history, past medical history, past social history, past surgical history and problem list. Problem list updated.  Objective:   Filed Vitals:   01/19/16 0831  BP: 125/68  Pulse: 91  Weight: 259 lb (117.482 kg)    Fetal Status: Fetal Heart Rate (bpm): 146 Fundal Height: 22 cm Movement: Present     General:  Alert, oriented and cooperative. Patient is in no acute distress.  Skin: Skin is warm and dry. No rash noted.   Cardiovascular: Normal heart rate noted  Respiratory: Normal respiratory effort, no problems with respiration noted  Abdomen: Soft, gravid, appropriate for gestational age. Pain/Pressure: Present     Pelvic: Vag. Bleeding: None     Cervical exam deferred        Extremities: Normal range of motion.     Mental Status: Normal mood and affect. Normal behavior. Normal judgment and thought content.   Urinalysis:      Assessment and Plan:  Pregnancy: [id number] at 6158w4d  1. Gestational diabetes mellitus (GDM) affecting pregnancy CBGs reviewed and majority within range with the exception of fasting which are as high as 117. Patient is not consuming a bedtime snack Follow up  growth ultrasound on 5/30  2. Chlamydia infection affecting pregnancy   3. H/O pre-eclampsia in prior pregnancy, currently pregnant, second trimester Continue daily ASA  4. AMA (advanced maternal age) multigravida 35+, second trimester Declined genetic testing  5. Previous cesarean section complicating pregnancy, antepartum condition or complication Will be scheduled for repeat  6. Supervision of high risk pregnancy, antepartum, second trimester Continue current care  General obstetric precautions including but not limited to vaginal bleeding, contractions, leaking of fluid and fetal movement were reviewed in detail with the patient. Please refer to After Visit Summary for other counseling recommendations.  Return in about 3 weeks (around 02/09/2016).   Catalina AntiguaPeggy Dallana Mavity, MD

## 2016-02-10 ENCOUNTER — Ambulatory Visit (HOSPITAL_COMMUNITY)
Admission: RE | Admit: 2016-02-10 | Discharge: 2016-02-10 | Disposition: A | Discharge status: Home / Self Care | Payer: BLUE CROSS/BLUE SHIELD | Source: Ambulatory Visit | Attending: Obstetrics and Gynecology | Admitting: Obstetrics and Gynecology

## 2016-02-10 VITALS — BP 119/68 | HR 85 | Wt 260.0 lb

## 2016-02-10 DIAGNOSIS — Z3A24 24 weeks gestation of pregnancy: Secondary | ICD-10-CM | POA: Diagnosis not present

## 2016-02-10 DIAGNOSIS — O09522 Supervision of elderly multigravida, second trimester: Secondary | ICD-10-CM

## 2016-02-10 DIAGNOSIS — O09292 Supervision of pregnancy with other poor reproductive or obstetric history, second trimester: Secondary | ICD-10-CM

## 2016-02-10 DIAGNOSIS — O24415 Gestational diabetes mellitus in pregnancy, controlled by oral hypoglycemic drugs: Secondary | ICD-10-CM

## 2016-02-10 DIAGNOSIS — O0992 Supervision of high risk pregnancy, unspecified, second trimester: Secondary | ICD-10-CM

## 2016-02-10 DIAGNOSIS — O24419 Gestational diabetes mellitus in pregnancy, unspecified control: Secondary | ICD-10-CM

## 2016-02-10 DIAGNOSIS — A749 Chlamydial infection, unspecified: Secondary | ICD-10-CM

## 2016-02-10 DIAGNOSIS — O34219 Maternal care for unspecified type scar from previous cesarean delivery: Secondary | ICD-10-CM

## 2016-02-10 DIAGNOSIS — O98819 Other maternal infectious and parasitic diseases complicating pregnancy, unspecified trimester: Secondary | ICD-10-CM

## 2016-02-10 IMAGING — US US MFM OB FOLLOW-UP
1 series · 14 of 28 positions shown · non-contrast
Comparison: none

[Series 1: us mfm ob follow-up · 52 acquisitions, 14 frames shown]
[im 2/52]
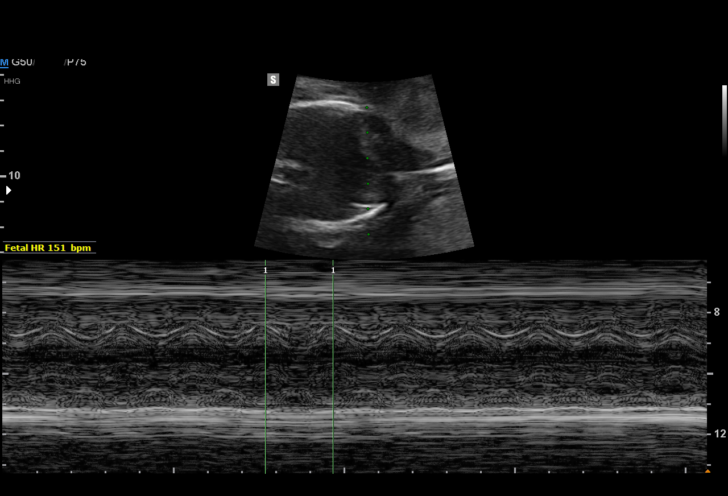
[im 6/52]
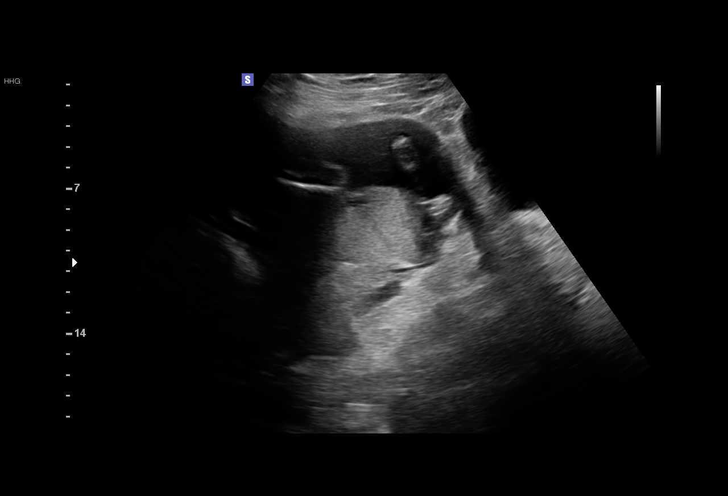
[im 10/52]
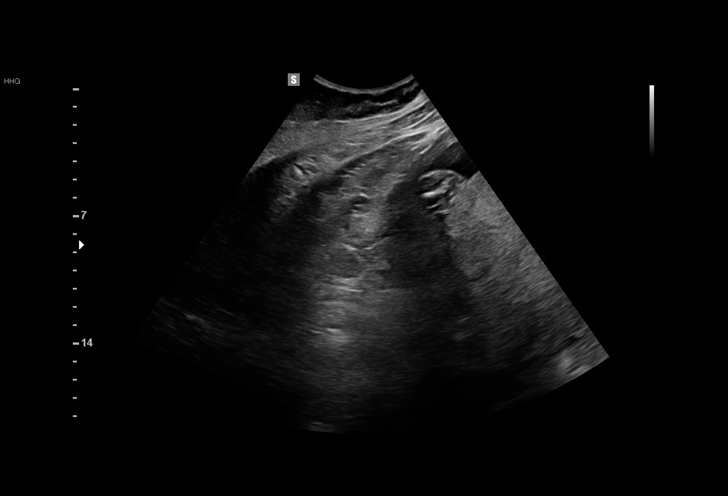
[im 14/52]
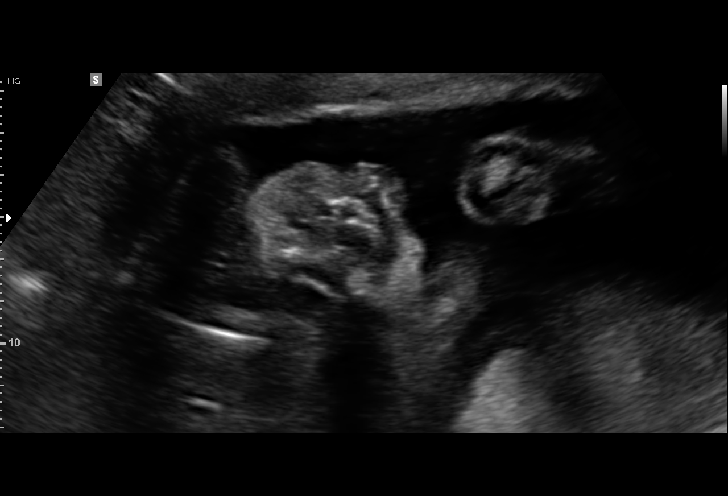
[im 18/52]
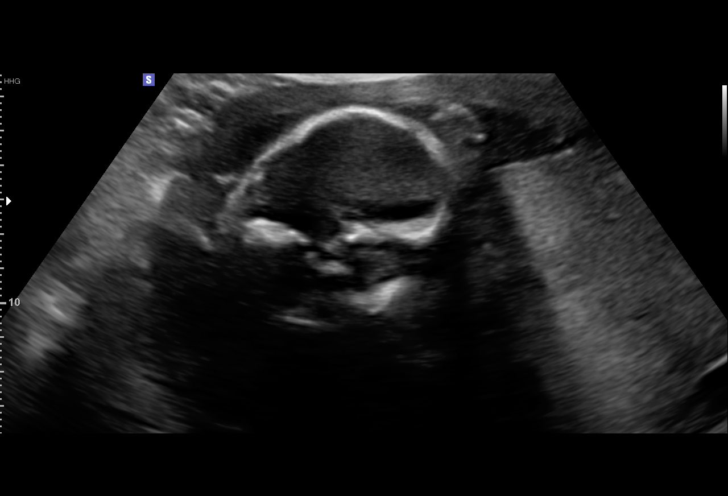
[im 21/52]
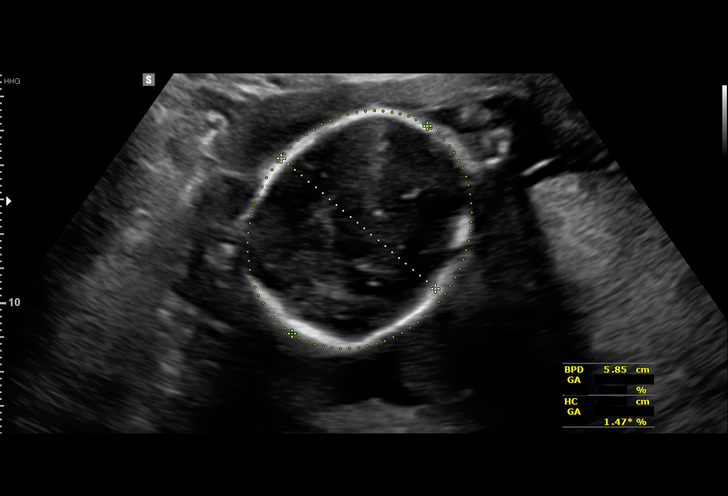
[im 25/52]
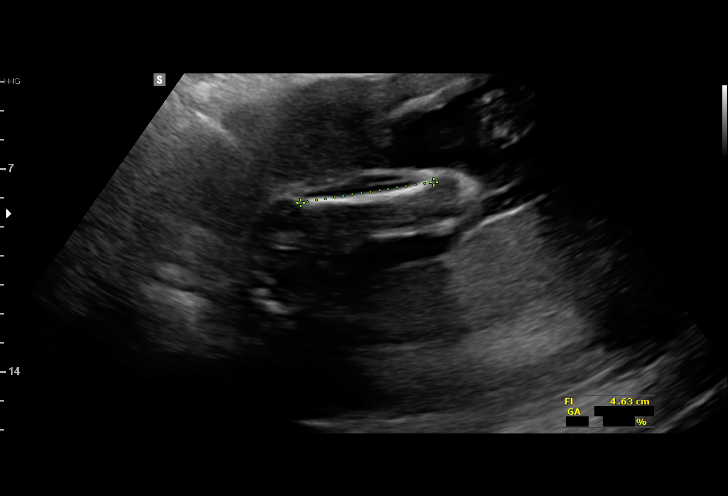
[im 29/52]
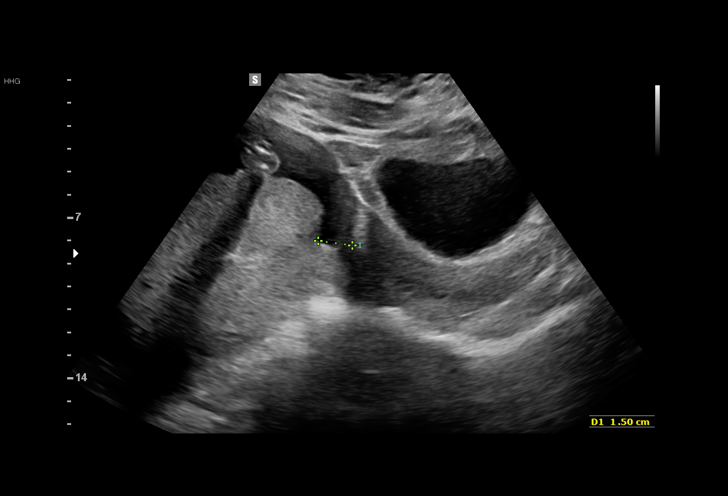
[im 33/52]
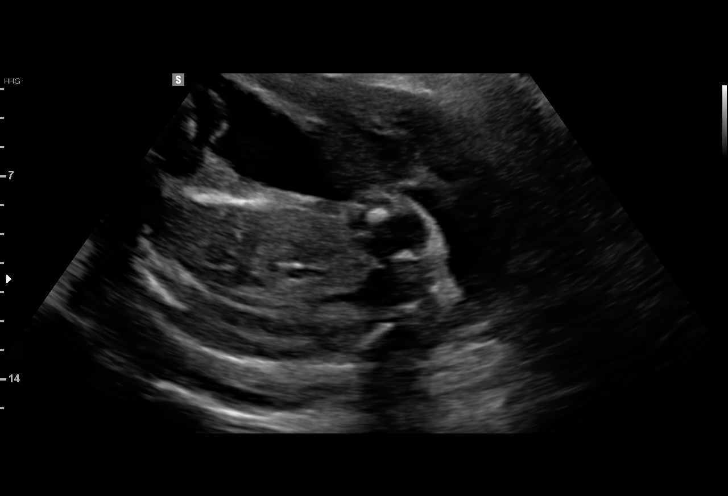
[im 36/52]
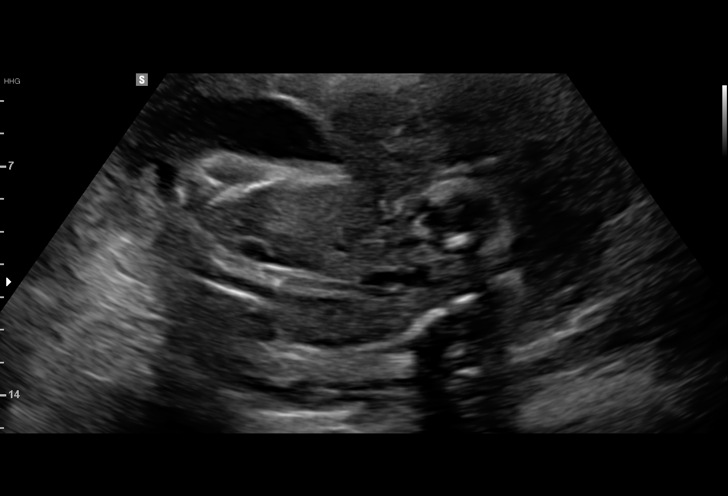
[im 40/52]
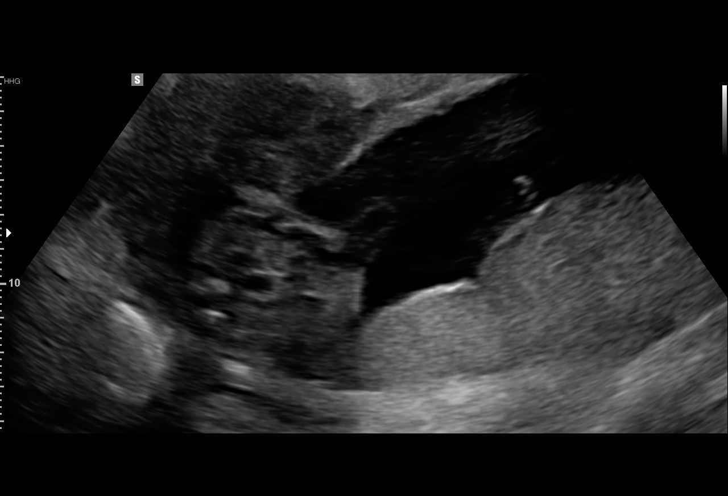
[im 44/52]
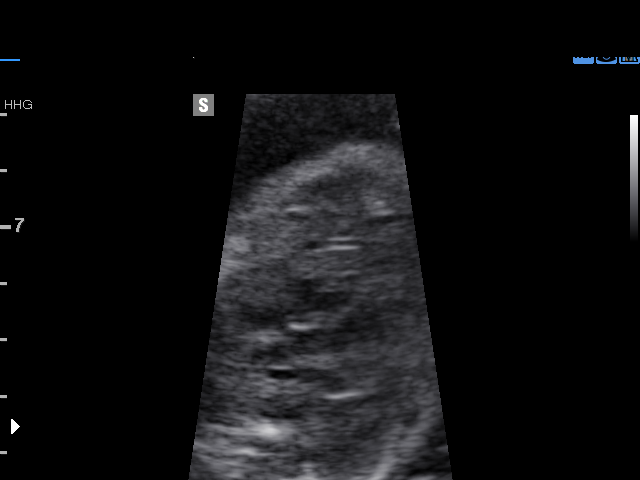
[im 48/52]
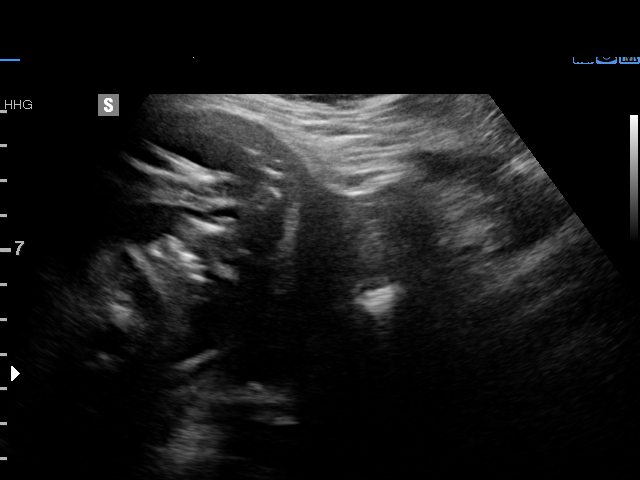
[im 52/52]
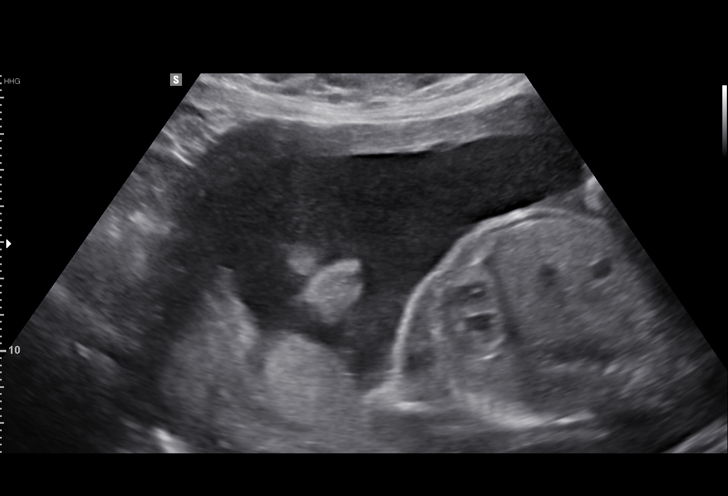

[14 of 28 positions shown; findings below may reference images not displayed]

Hospital Clinic-
Faculty Physician
OB/Gyn Clinic
[REDACTED]

1  FATTY HRNDZ            [id number]      [id number]     [id number]
Indications

24 weeks gestation of pregnancy
Gestational diabetes in pregnancy,
controlled by oral hypoglycemic drugs
(glyburide); A2/B
Advanced maternal age multigravida 35+,
second trimester; declined testing
Obesity complicating pregnancy, second
trimester
Poor obstetric history: Previous
preeclampsia / eclampsia/gestational HTN
Follow-up incomplete fetal anatomic            Z36
evaluation
OB History

Gravidity:    5         Term:   2        Prem:   0        SAB:   1
TOP:          1       Ectopic:  0        Living: 2
Fetal Evaluation

Num Of Fetuses:     1
Fetal Heart         151
Rate(bpm):
Cardiac Activity:   Observed
Presentation:       Breech
Placenta:           Posterior/left lateral
P. Cord Insertion:  Previously Visualized

Amniotic Fluid
AFI FV:      Subjectively within normal limits

Largest Pocket(cm)
5.2
Biometry

BPD:      58.2  mm     G. Age:  23w 6d         15  %    CI:         75.5   %   70 - 86
FL/HC:      21.6   %   18.7 -
HC:      212.4  mm     G. Age:  23w 2d        < 3  %    HC/AC:      0.96       1.04 -
AC:      221.3  mm     G. Age:  26w 4d         90  %    FL/BPD:     78.9   %   71 - 87
FL:       45.9  mm     G. Age:  25w 2d         53  %    FL/AC:      20.7   %   20 - 24
HUM:      40.2  mm     G. Age:  24w 3d         36  %

Est. FW:     830  gm    1 lb 13 oz      70  %
Gestational Age

LMP:           29w 4d       Date:   07/18/15                 EDD:   04/23/16
U/S Today:     24w 5d                                        EDD:   05/27/16
Best:          24w 5d    Det. By:   Early Ultrasound         EDD:   05/27/16
(10/08/15)
Anatomy

Cranium:               Appears normal         Aortic Arch:            Previously seen
Cavum:                 Previously seen        Ductal Arch:            Previously seen
Ventricles:            Appears normal         Diaphragm:              Appears normal
Choroid Plexus:        Appears normal         Stomach:                Appears normal, left
sided
Cerebellum:            Previously seen        Abdomen:                Appears normal
Posterior Fossa:       Previously seen        Abdominal Wall:         Appears nml (cord
insert, abd wall)
Nuchal Fold:           Not applicable (>20    Cord Vessels:           Previously seen
wks GA)
Face:                  Orbits appear          Kidneys:                Appear normal
normal
Lips:                  Appears normal         Bladder:                Appears normal
Thoracic:              Appears normal         Spine:                  Previously seen
Heart:                 Previously seen        Upper Extremities:      Previously seen
RVOT:                  Previously seen        Lower Extremities:      Previously seen
LVOT:                  Appears normal

Other:  Fetus appears to be a female. Nasal bone visualized. Heels and 5th
digit previously seen.
Cervix Uterus Adnexa

Cervix
Length:            5.8  cm.
Normal appearance by transabdominal scan.

Uterus
No abnormality visualized.
Left Ovary
Not visualized.

Right Ovary
Not visualized.

Adnexa:       No adnexal mass visualized.
Impression

SIUP at 24+5 weeks
Normal interval anatomy; anatomic survey complete
Normal amniotic fluid volume
Appropriate interval growth with EFW at the 70th %tile
Posterior/left lateral placenta - contraction in LUS, appeared
to be low-lying placenta but probably not
Recommendations

Follow-up ultrasound for growth in 4 weeks
Perform EV scan next appt if unable to completely asssess
placental location by TA views

## 2016-02-16 ENCOUNTER — Ambulatory Visit (INDEPENDENT_AMBULATORY_CARE_PROVIDER_SITE_OTHER): Payer: BLUE CROSS/BLUE SHIELD | Admitting: Family Medicine

## 2016-02-16 VITALS — BP 131/89 | HR 84 | Wt 261.0 lb

## 2016-02-16 DIAGNOSIS — O09522 Supervision of elderly multigravida, second trimester: Secondary | ICD-10-CM | POA: Diagnosis not present

## 2016-02-16 DIAGNOSIS — O34219 Maternal care for unspecified type scar from previous cesarean delivery: Secondary | ICD-10-CM

## 2016-02-16 DIAGNOSIS — O0992 Supervision of high risk pregnancy, unspecified, second trimester: Secondary | ICD-10-CM

## 2016-02-16 DIAGNOSIS — O24419 Gestational diabetes mellitus in pregnancy, unspecified control: Secondary | ICD-10-CM | POA: Diagnosis not present

## 2016-02-16 LAB — POCT URINALYSIS DIP (DEVICE)
GLUCOSE, UA: NEGATIVE mg/dL
Hgb urine dipstick: NEGATIVE
Ketones, ur: 40 mg/dL — AB
Leukocytes, UA: NEGATIVE
NITRITE: NEGATIVE
PH: 6 (ref 5.0–8.0)
Protein, ur: 30 mg/dL — AB
Specific Gravity, Urine: >=1.03 (ref 1.005–1.030)
Urobilinogen, UA: 2 mg/dL — ABNORMAL HIGH (ref 0.0–1.0)

## 2016-02-16 NOTE — Progress Notes (Signed)
SUBJECTIVE: Pt. referred by Dr Shawnie PonsPratt for symptoms of depression  Pt. reports the following symptoms/concerns: Pt states that she has little energy, is not sleeping well, not drinking as much water as previous pregnancies, is overwhelmed at work dealing with negative customers. Pt used to like to call friends and family, but does not want to make any calls this week.  Duration of problem: About 6 months Severity: moderate   OBJECTIVE: Orientation & Cognition: Oriented x3. Thought processes normal and appropriate to situation. Mood: appropriate Affect: appropriate Appearance: appropriate Risk of harm to self or others: no known risk of harm to self and others Substance use: none Assessments administered: none  ASSESSMENT: Pt currently experiencing symptoms of anxiety and depression. [facility]  Pt would benefit from psychoeducation and brief therapeutic interventions regarding coping with symptoms of anxiety and depression.   Stage of Change: contemplative  PLAN: 1. F/U with behavioral health clinician at next visit 2. Psychiatric Medications: none 3. Behavioral recommendations:   -Daily relaxation breathing excercises, after taking prenatal vitamin, and again at bedtime -Consider increasing fluid/water intake -Consider making list of friends and families to think about calling  -------------------------------------------- Other(s) present in the room: none   Time spent with patient in exam room: 15 minutes 9:40am to 9:55am   Flowsheet  Depression screen [facility] 2/9 02/10/2016 12/29/2015 11/24/2015  Decreased Interest 0 0 0  Down, Depressed, Hopeless 0 0 0  PHQ - 2 Score 0 0 0     LIFE CONTEXT:  Family: Lives with husband, 6 and 12yo sons Social: Contacts friends and family mostly by phone Work/School:Works in customer service industry Recreation: -- Self-Care: Not sleeping well, not drinking enough water; taking prenatal vitamin

## 2016-02-16 NOTE — Progress Notes (Signed)
Subjective:  Ashley Pugh is a 35 y.o. [id number] at 305w4d being seen today for ongoing prenatal care.  She is currently monitored for the following issues for this high-risk pregnancy and has Supervision of high risk pregnancy, antepartum; Previous cesarean section complicating pregnancy, antepartum condition or complication; AMA (advanced maternal age) multigravida 35+; H/O pre-eclampsia in prior pregnancy, currently pregnant; Chlamydia infection affecting pregnancy; and Gestational diabetes mellitus (GDM) affecting pregnancy on her problem list.  Patient reports depression, crying spells.  Contractions: Irritability. Vag. Bleeding: None.  Movement: Present. Denies leaking of fluid.   The following portions of the patient's history were reviewed and updated as appropriate: allergies, current medications, past family history, past medical history, past social history, past surgical history and problem list. Problem list updated.  Objective:   Filed Vitals:   02/16/16 0827  BP: 131/89  Pulse: 84  Weight: 261 lb (118.389 kg)    Fetal Status: Fetal Heart Rate (bpm): 148 Fundal Height: 34 cm Movement: Present     General:  Alert, oriented and cooperative. Patient is in no acute distress.  Skin: Skin is warm and dry. No rash noted.   Cardiovascular: Normal heart rate noted  Respiratory: Normal respiratory effort, no problems with respiration noted  Abdomen: Soft, gravid, appropriate for gestational age. Pain/Pressure: Present     Pelvic: Vag. Bleeding: None     Cervical exam deferred        Extremities: Normal range of motion.  Edema: Trace  Mental Status: Normal mood and affect. Normal behavior. Normal judgment and thought content.   No book today Fasting BS are 120-130 reports 2 hour pp 80-90s Reports side effects at full dose pill--getting lows Feels depressed Assessment and Plan:  Pregnancy: [id number] at 315w4d  1. Previous cesarean section complicating pregnancy, antepartum  condition or complication For Elective repeat  2. Supervision of high risk pregnancy, antepartum, second trimester Continue routine prenatal care.  3. Gestational diabetes mellitus (GDM) affecting pregnancy Continue glyburide - try full pill at hs again.  4. Stress Agreed to breathing exercises--to f/u with Ashley Pugh at next visit.  Preterm labor symptoms and general obstetric precautions including but not limited to vaginal bleeding, contractions, leaking of fluid and fetal movement were reviewed in detail with the patient. Please refer to After Visit Summary for other counseling recommendations.  Return in 2 weeks (on 03/01/2016).   Reva Boresanya S Pratt, MD

## 2016-02-16 NOTE — Patient Instructions (Signed)
Gestational Diabetes Mellitus Gestational diabetes mellitus, often simply referred to as gestational diabetes, is a type of diabetes that some women develop during pregnancy. In gestational diabetes, the pancreas does not make enough insulin (a hormone), the cells are less responsive to the insulin that is made (insulin resistance), or both.Normally, insulin moves sugars from food into the tissue cells. The tissue cells use the sugars for energy. The lack of insulin or the lack of normal response to insulin causes excess sugars to build up in the blood instead of going into the tissue cells. As a result, high blood sugar (hyperglycemia) develops. The effect of high sugar (glucose) levels can cause many problems.  RISK FACTORS You have an increased chance of developing gestational diabetes if you have a family history of diabetes and also have one or more of the following risk factors:  A body mass index over 30 (obesity).  A previous pregnancy with gestational diabetes.  An older age at the time of pregnancy. If blood glucose levels are kept in the normal range during pregnancy, women can have a healthy pregnancy. If your blood glucose levels are not well controlled, there may be risks to you, your unborn baby (fetus), your labor and delivery, or your newborn baby.  SYMPTOMS  If symptoms are experienced, they are much like symptoms you would normally expect during pregnancy. The symptoms of gestational diabetes include:   Increased thirst (polydipsia).  Increased urination (polyuria).  Increased urination during the night (nocturia).  Weight loss. This weight loss may be rapid.  Frequent, recurring infections.  Tiredness (fatigue).  Weakness.  Vision changes, such as blurred vision.  Fruity smell to your breath.  Abdominal pain. DIAGNOSIS Diabetes is diagnosed when blood glucose levels are increased. Your blood glucose level may be checked by one or more of the following blood  tests:  A fasting blood glucose test. You will not be allowed to eat for at least 8 hours before a blood sample is taken.  A random blood glucose test. Your blood glucose is checked at any time of the day regardless of when you ate.  An oral glucose tolerance test (OGTT). Your blood glucose is measured after you have not eaten (fasted) for 1-3 hours and then after you drink a glucose-containing beverage. Since the hormones that cause insulin resistance are highest at about 24-28 weeks of a pregnancy, an OGTT is usually performed during that time. If you have risk factors, you may be screened for undiagnosed type 2 diabetes at your first prenatal visit. TREATMENT  Gestational diabetes should be managed first with diet and exercise. Medicines may be added only if they are needed.  You will need to take diabetes medicine or insulin daily to keep blood glucose levels in the desired range.  You will need to match insulin dosing with exercise and healthy food choices. If you have gestational diabetes, your treatment goal is to maintain the following blood glucose levels:  Before meals (preprandial): at or below 95 mg/dL.  After meals (postprandial):  One hour after a meal: at or below 140 mg/dL.  Two hours after a meal: at or below 120 mg/dL. If you have pre-existing type 1 or type 2 diabetes, your treatment goal is to maintain the following blood glucose levels:  Before meals, at bedtime, and overnight: 60-99 mg/dL.  After meals: peak of 100-129 mg/dL. HOME CARE INSTRUCTIONS   Have your hemoglobin A1c level checked twice a year.  Perform daily blood glucose monitoring as directed by   your health care provider. It is common to perform frequent blood glucose monitoring.  Monitor urine ketones when you are ill and as directed by your health care provider.  Take your diabetes medicine and insulin as directed by your health care provider to maintain your blood glucose level in the desired  range.  Never run out of diabetes medicine or insulin. It is needed every day.  Adjust insulin based on your intake of carbohydrates. Carbohydrates can raise blood glucose levels but need to be included in your diet. Carbohydrates provide vitamins, minerals, and fiber which are an essential part of a healthy diet. Carbohydrates are found in fruits, vegetables, whole grains, dairy products, legumes, and foods containing added sugars.  Eat healthy foods. Alternate 3 meals with 3 snacks.  Maintain a healthy weight gain. The usual total expected weight gain varies according to your prepregnancy body mass index (BMI).  Carry a medical alert card or wear your medical alert jewelry.  Carry a 15-gram carbohydrate snack with you at all times to treat low blood glucose (hypoglycemia). Some examples of 15-gram carbohydrate snacks include:  Glucose tablets, 3 or 4.  Glucose gel, 15-gram tube.  Raisins, 2 tablespoons (24 g).  Jelly beans, 6.  Animal crackers, 8.  Fruit juice, regular soda, or low-fat milk, 4 ounces (120 mL).  Gummy treats, 9.  Recognize hypoglycemia. Hypoglycemia during pregnancy occurs with blood glucose levels of 60 mg/dL and below. The risk for hypoglycemia increases when fasting or skipping meals, during or after intense exercise, and during sleep. Hypoglycemia symptoms can include:  Tremors or shakes.  Decreased ability to concentrate.  Sweating.  Increased heart rate.  Headache.  Dry mouth.  Hunger.  Irritability.  Anxiety.  Restless sleep.  Altered speech or coordination.  Confusion.  Treat hypoglycemia promptly. If you are alert and able to safely swallow, follow the 15:15 rule:  Take 15-20 grams of rapid-acting glucose or carbohydrate. Rapid-acting options include glucose gel, glucose tablets, or 4 ounces (120 mL) of fruit juice, regular soda, or low-fat milk.  Check your blood glucose level 15 minutes after taking the glucose.  Take 15-20  grams more of glucose if the repeat blood glucose level is still 70 mg/dL or below.  Eat a meal or snack within 1 hour once blood glucose levels return to normal.  Be alert to polyuria (excess urination) and polydipsia (excess thirst) which are early signs of hyperglycemia. An early awareness of hyperglycemia allows for prompt treatment. Treat hyperglycemia as directed by your health care provider.  Engage in at least 30 minutes of physical activity a day or as directed by your health care provider. Ten minutes of physical activity timed 30 minutes after each meal is encouraged to control postprandial blood glucose levels.  Adjust your insulin dosing and food intake as needed if you start a new exercise or sport.  Follow your sick-day plan at any time you are unable to eat or drink as usual.  Avoid tobacco and alcohol use.  Keep all follow-up visits as directed by your health care provider.  Follow the advice of your health care provider regarding your prenatal and post-delivery (postpartum) appointments, meal planning, exercise, medicines, vitamins, blood tests, other medical tests, and physical activities.  Perform daily skin and foot care. Examine your skin and feet daily for cuts, bruises, redness, nail problems, bleeding, blisters, or sores.  Brush your teeth and gums at least twice a day and floss at least once a day. Follow up with your dentist   regularly.  Schedule an eye exam during the first trimester of your pregnancy or as directed by your health care provider.  Share your diabetes management plan with your workplace or school.  Stay up-to-date with immunizations.  Learn to manage stress.  Obtain ongoing diabetes education and support as needed.  Learn about and consider breastfeeding your baby.  You should have your blood sugar level checked 6-12 weeks after delivery. This is done with an oral glucose tolerance test (OGTT). SEEK MEDICAL CARE IF:   You are unable to  eat food or drink fluids for more than 6 hours.  You have nausea and vomiting for more than 6 hours.  You have a blood glucose level of 200 mg/dL and you have ketones in your urine.  There is a change in mental status.  You develop vision problems.  You have a persistent headache.  You have upper abdominal pain or discomfort.  You develop an additional serious illness.  You have diarrhea for more than 6 hours.  You have been sick or have had a fever for a couple of days and are not getting better. SEEK IMMEDIATE MEDICAL CARE IF:   You have difficulty breathing.  You no longer feel the baby moving.  You are bleeding or have discharge from your vagina.  You start having premature contractions or labor. MAKE SURE YOU:  Understand these instructions.  Will watch your condition.  Will get help right away if you are not doing well or get worse.   This information is not intended to replace advice given to you by your health care provider. Make sure you discuss any questions you have with your health care provider.   Document Released: 12/06/2000 Document Revised: 09/20/2014 Document Reviewed: 03/28/2012 Elsevier Interactive Patient Education 2016 [employer]  Breastfeeding Deciding to breastfeed is one of the best choices you can make for you and your baby. A change in hormones during pregnancy causes your breast tissue to grow and increases the number and size of your milk ducts. These hormones also allow proteins, sugars, and fats from your blood supply to make breast milk in your milk-producing glands. Hormones prevent breast milk from being released before your baby is born as well as prompt milk flow after birth. Once breastfeeding has begun, thoughts of your baby, as well as his or her sucking or crying, can stimulate the release of milk from your milk-producing glands.  BENEFITS OF BREASTFEEDING For Your Baby  Your first milk (colostrum) helps your baby's digestive  system function better.  There are antibodies in your milk that help your baby fight off infections.  Your baby has a lower incidence of asthma, allergies, and sudden infant death syndrome.  The nutrients in breast milk are better for your baby than infant formulas and are designed uniquely for your baby's needs.  Breast milk improves your baby's brain development.  Your baby is less likely to develop other conditions, such as childhood obesity, asthma, or type 2 diabetes mellitus. For You  Breastfeeding helps to create a very special bond between you and your baby.  Breastfeeding is convenient. Breast milk is always available at the correct temperature and costs nothing.  Breastfeeding helps to burn calories and helps you lose the weight gained during pregnancy.  Breastfeeding makes your uterus contract to its prepregnancy size faster and slows bleeding (lochia) after you give birth.   Breastfeeding helps to lower your risk of developing type 2 diabetes mellitus, osteoporosis, and breast or ovarian cancer   later in life. SIGNS THAT YOUR BABY IS HUNGRY Early Signs of Hunger  Increased alertness or activity.  Stretching.  Movement of the head from side to side.  Movement of the head and opening of the mouth when the corner of the mouth or cheek is stroked (rooting).  Increased sucking sounds, smacking lips, cooing, sighing, or squeaking.  Hand-to-mouth movements.  Increased sucking of fingers or hands. Late Signs of Hunger  Fussing.  Intermittent crying. Extreme Signs of Hunger Signs of extreme hunger will require calming and consoling before your baby will be able to breastfeed successfully. Do not wait for the following signs of extreme hunger to occur before you initiate breastfeeding:  Restlessness.  A loud, strong cry.  Screaming. BREASTFEEDING BASICS Breastfeeding Initiation  Find a comfortable place to sit or lie down, with your neck and back well  supported.  Place a pillow or rolled up blanket under your baby to bring him or her to the level of your breast (if you are seated). Nursing pillows are specially designed to help support your arms and your baby while you breastfeed.  Make sure that your baby's abdomen is facing your abdomen.  Gently massage your breast. With your fingertips, massage from your chest wall toward your nipple in a circular motion. This encourages milk flow. You may need to continue this action during the feeding if your milk flows slowly.  Support your breast with 4 fingers underneath and your thumb above your nipple. Make sure your fingers are well away from your nipple and your baby's mouth.  Stroke your baby's lips gently with your finger or nipple.  When your baby's mouth is open wide enough, quickly bring your baby to your breast, placing your entire nipple and as much of the colored area around your nipple (areola) as possible into your baby's mouth.  More areola should be visible above your baby's upper lip than below the lower lip.  Your baby's tongue should be between his or her lower gum and your breast.  Ensure that your baby's mouth is correctly positioned around your nipple (latched). Your baby's lips should create a seal on your breast and be turned out (everted).  It is common for your baby to suck about 2-3 minutes in order to start the flow of breast milk. Latching Teaching your baby how to latch on to your breast properly is very important. An improper latch can cause nipple pain and decreased milk supply for you and poor weight gain in your baby. Also, if your baby is not latched onto your nipple properly, he or she may swallow some air during feeding. This can make your baby fussy. Burping your baby when you switch breasts during the feeding can help to get rid of the air. However, teaching your baby to latch on properly is still the best way to prevent fussiness from swallowing air while  breastfeeding. Signs that your baby has successfully latched on to your nipple:  Silent tugging or silent sucking, without causing you pain.  Swallowing heard between every 3-4 sucks.  Muscle movement above and in front of his or her ears while sucking. Signs that your baby has not successfully latched on to nipple:  Sucking sounds or smacking sounds from your baby while breastfeeding.  Nipple pain. If you think your baby has not latched on correctly, slip your finger into the corner of your baby's mouth to break the suction and place it between your baby's gums. Attempt breastfeeding initiation again. Signs   of Successful Breastfeeding Signs from your baby:  A gradual decrease in the number of sucks or complete cessation of sucking.  Falling asleep.  Relaxation of his or her body.  Retention of a small amount of milk in his or her mouth.  Letting go of your breast by himself or herself. Signs from you:  Breasts that have increased in firmness, weight, and size 1-3 hours after feeding.  Breasts that are softer immediately after breastfeeding.  Increased milk volume, as well as a change in milk consistency and color by the fifth day of breastfeeding.  Nipples that are not sore, cracked, or bleeding. Signs That Your Baby is Getting Enough Milk  Wetting at least 3 diapers in a 24-hour period. The urine should be clear and pale yellow by age 5 days.  At least 3 stools in a 24-hour period by age 5 days. The stool should be soft and yellow.  At least 3 stools in a 24-hour period by age 7 days. The stool should be seedy and yellow.  No loss of weight greater than 10% of birth weight during the first 3 days of age.  Average weight gain of 4-7 ounces (113-198 g) per week after age 4 days.  Consistent daily weight gain by age 5 days, without weight loss after the age of 2 weeks. After a feeding, your baby may spit up a small amount. This is common. BREASTFEEDING FREQUENCY AND  DURATION Frequent feeding will help you make more milk and can prevent sore nipples and breast engorgement. Breastfeed when you feel the need to reduce the fullness of your breasts or when your baby shows signs of hunger. This is called "breastfeeding on demand." Avoid introducing a pacifier to your baby while you are working to establish breastfeeding (the first 4-6 weeks after your baby is born). After this time you may choose to use a pacifier. Research has shown that pacifier use during the first year of a baby's life decreases the risk of sudden infant death syndrome (SIDS). Allow your baby to feed on each breast as long as he or she wants. Breastfeed until your baby is finished feeding. When your baby unlatches or falls asleep while feeding from the first breast, offer the second breast. Because newborns are often sleepy in the first few weeks of life, you may need to awaken your baby to get him or her to feed. Breastfeeding times will vary from baby to baby. However, the following rules can serve as a guide to help you ensure that your baby is properly fed:  Newborns (babies 4 weeks of age or younger) may breastfeed every 1-3 hours.  Newborns should not go longer than 3 hours during the day or 5 hours during the night without breastfeeding.  You should breastfeed your baby a minimum of 8 times in a 24-hour period until you begin to introduce solid foods to your baby at around 6 months of age. BREAST MILK PUMPING Pumping and storing breast milk allows you to ensure that your baby is exclusively fed your breast milk, even at times when you are unable to breastfeed. This is especially important if you are going back to work while you are still breastfeeding or when you are not able to be present during feedings. Your lactation consultant can give you guidelines on how long it is safe to store breast milk. A breast pump is a machine that allows you to pump milk from your breast into a sterile bottle.  The pumped   breast milk can then be stored in a refrigerator or freezer. Some breast pumps are operated by hand, while others use electricity. Ask your lactation consultant which type will work best for you. Breast pumps can be purchased, but some hospitals and breastfeeding support groups lease breast pumps on a monthly basis. A lactation consultant can teach you how to hand express breast milk, if you prefer not to use a pump. CARING FOR YOUR BREASTS WHILE YOU BREASTFEED Nipples can become dry, cracked, and sore while breastfeeding. The following recommendations can help keep your breasts moisturized and healthy:  Avoid using soap on your nipples.  Wear a supportive bra. Although not required, special nursing bras and tank tops are designed to allow access to your breasts for breastfeeding without taking off your entire bra or top. Avoid wearing underwire-style bras or extremely tight bras.  Air dry your nipples for 3-4minutes after each feeding.  Use only cotton bra pads to absorb leaked breast milk. Leaking of breast milk between feedings is normal.  Use lanolin on your nipples after breastfeeding. Lanolin helps to maintain your skin's normal moisture barrier. If you use pure lanolin, you do not need to wash it off before feeding your baby again. Pure lanolin is not toxic to your baby. You may also hand express a few drops of breast milk and gently massage that milk into your nipples and allow the milk to air dry. In the first few weeks after giving birth, some women experience extremely full breasts (engorgement). Engorgement can make your breasts feel heavy, warm, and tender to the touch. Engorgement peaks within 3-5 days after you give birth. The following recommendations can help ease engorgement:  Completely empty your breasts while breastfeeding or pumping. You may want to start by applying warm, moist heat (in the shower or with warm water-soaked hand towels) just before feeding or pumping.  This increases circulation and helps the milk flow. If your baby does not completely empty your breasts while breastfeeding, pump any extra milk after he or she is finished.  Wear a snug bra (nursing or regular) or tank top for 1-2 days to signal your body to slightly decrease milk production.  Apply ice packs to your breasts, unless this is too uncomfortable for you.  Make sure that your baby is latched on and positioned properly while breastfeeding. If engorgement persists after 48 hours of following these recommendations, contact your health care provider or a lactation consultant. OVERALL HEALTH CARE RECOMMENDATIONS WHILE BREASTFEEDING  Eat healthy foods. Alternate between meals and snacks, eating 3 of each per day. Because what you eat affects your breast milk, some of the foods may make your baby more irritable than usual. Avoid eating these foods if you are sure that they are negatively affecting your baby.  Drink milk, fruit juice, and water to satisfy your thirst (about 10 glasses a day).  Rest often, relax, and continue to take your prenatal vitamins to prevent fatigue, stress, and anemia.  Continue breast self-awareness checks.  Avoid chewing and smoking tobacco. Chemicals from cigarettes that pass into breast milk and exposure to secondhand smoke may harm your baby.  Avoid alcohol and drug use, including marijuana. Some medicines that may be harmful to your baby can pass through breast milk. It is important to ask your health care provider before taking any medicine, including all over-the-counter and prescription medicine as well as vitamin and herbal supplements. It is possible to become pregnant while breastfeeding. If birth control is desired, ask   your health care provider about options that will be safe for your baby. SEEK MEDICAL CARE IF:  You feel like you want to stop breastfeeding or have become frustrated with breastfeeding.  You have painful breasts or  nipples.  Your nipples are cracked or bleeding.  Your breasts are red, tender, or warm.  You have a swollen area on either breast.  You have a fever or chills.  You have nausea or vomiting.  You have drainage other than breast milk from your nipples.  Your breasts do not become full before feedings by the fifth day after you give birth.  You feel sad and depressed.  Your baby is too sleepy to eat well.  Your baby is having trouble sleeping.   Your baby is wetting less than 3 diapers in a 24-hour period.  Your baby has less than 3 stools in a 24-hour period.  Your baby's skin or the white part of his or her eyes becomes yellow.   Your baby is not gaining weight by 5 days of age. SEEK IMMEDIATE MEDICAL CARE IF:  Your baby is overly tired (lethargic) and does not want to wake up and feed.  Your baby develops an unexplained fever.   This information is not intended to replace advice given to you by your health care provider. Make sure you discuss any questions you have with your health care provider.   Document Released: 08/30/2005 Document Revised: 05/21/2015 Document Reviewed: 02/21/2013 Elsevier Interactive Patient Education 2016 [employer]  

## 2016-02-16 NOTE — Progress Notes (Signed)
Patient reports feeling depressed and things have got worse. Patient reports not wanting to go out, just wants to stay at home all day in bed

## 2016-02-18 ENCOUNTER — Telehealth: Payer: Self-pay

## 2016-02-18 NOTE — Telephone Encounter (Signed)
Pt drop of FMLA paper to be complete. She stated you have agree to take her out of work effective now. Please let me know if you want me to go ahead and complete these forms. Thanks

## 2016-03-01 ENCOUNTER — Ambulatory Visit (INDEPENDENT_AMBULATORY_CARE_PROVIDER_SITE_OTHER): Payer: Medicaid Other | Admitting: Obstetrics and Gynecology

## 2016-03-01 VITALS — BP 122/75 | HR 92 | Temp 98.4°F | Wt 261.5 lb

## 2016-03-01 DIAGNOSIS — O34219 Maternal care for unspecified type scar from previous cesarean delivery: Secondary | ICD-10-CM | POA: Diagnosis not present

## 2016-03-01 DIAGNOSIS — O99342 Other mental disorders complicating pregnancy, second trimester: Secondary | ICD-10-CM | POA: Diagnosis not present

## 2016-03-01 DIAGNOSIS — F329 Major depressive disorder, single episode, unspecified: Secondary | ICD-10-CM | POA: Diagnosis not present

## 2016-03-01 DIAGNOSIS — O09292 Supervision of pregnancy with other poor reproductive or obstetric history, second trimester: Secondary | ICD-10-CM | POA: Diagnosis not present

## 2016-03-01 DIAGNOSIS — O0992 Supervision of high risk pregnancy, unspecified, second trimester: Secondary | ICD-10-CM

## 2016-03-01 DIAGNOSIS — F32A Depression, unspecified: Secondary | ICD-10-CM | POA: Insufficient documentation

## 2016-03-01 DIAGNOSIS — O09522 Supervision of elderly multigravida, second trimester: Secondary | ICD-10-CM | POA: Diagnosis not present

## 2016-03-01 DIAGNOSIS — O24419 Gestational diabetes mellitus in pregnancy, unspecified control: Secondary | ICD-10-CM

## 2016-03-01 DIAGNOSIS — Z3490 Encounter for supervision of normal pregnancy, unspecified, unspecified trimester: Secondary | ICD-10-CM

## 2016-03-01 LAB — CBC
HEMATOCRIT: 34.1 % — AB (ref 35.0–45.0)
Hemoglobin: 11.2 g/dL — ABNORMAL LOW (ref 11.7–15.5)
MCH: 28.1 pg (ref 27.0–33.0)
MCHC: 32.8 g/dL (ref 32.0–36.0)
MCV: 85.7 fL (ref 80.0–100.0)
MPV: 10.2 fL (ref 7.5–12.5)
PLATELETS: 216 10*3/uL (ref 140–400)
RBC: 3.98 MIL/uL (ref 3.80–5.10)
RDW: 14.8 % (ref 11.0–15.0)
WBC: 6.5 10*3/uL (ref 3.8–10.8)

## 2016-03-01 LAB — POCT URINALYSIS DIP (DEVICE)
Bilirubin Urine: NEGATIVE
GLUCOSE, UA: NEGATIVE mg/dL
Hgb urine dipstick: NEGATIVE
Ketones, ur: 15 mg/dL — AB
LEUKOCYTES UA: NEGATIVE
NITRITE: NEGATIVE
PROTEIN: NEGATIVE mg/dL
SPECIFIC GRAVITY, URINE: 1.02 (ref 1.005–1.030)
UROBILINOGEN UA: 1 mg/dL (ref 0.0–1.0)
pH: 6.5 (ref 5.0–8.0)

## 2016-03-01 LAB — HIV ANTIBODY (ROUTINE TESTING W REFLEX): HIV: NONREACTIVE

## 2016-03-01 NOTE — Progress Notes (Signed)
Subjective:  Ashley Pugh is a 35 y.o. [id number] at 5343w4d being seen today for ongoing prenatal care.  She is currently monitored for the following issues for this high-risk pregnancy and has Supervision of high risk pregnancy, antepartum; Previous cesarean section complicating pregnancy, antepartum condition or complication; AMA (advanced maternal age) multigravida 35+; H/O pre-eclampsia in prior pregnancy, currently pregnant; Chlamydia infection affecting pregnancy; and Gestational diabetes mellitus (GDM) affecting pregnancy on her problem list.  Patient reports no complaints.   Contractions: Irritability. Vag. Bleeding: None.  Movement: Present. Denies leaking of fluid.   The following portions of the patient's history were reviewed and updated as appropriate: allergies, current medications, past family history, past medical history, past social history, past surgical history and problem list. Problem list updated.  Objective:   Filed Vitals:   03/01/16 1048  BP: 122/75  Pulse: 92  Temp: 98.4 F (36.9 C)  Weight: 261 lb 8 oz (118.616 kg)    Fetal Status:     Movement: Present     General:  Alert, oriented and cooperative. Patient is in no acute distress.  Skin: Skin is warm and dry. No rash noted.   Cardiovascular: Normal heart rate noted  Respiratory: Normal respiratory effort, no problems with respiration noted  Abdomen: Soft, gravid, appropriate for gestational age. Pain/Pressure: Absent     Pelvic:  Cervical exam deferred        Extremities: Normal range of motion.  Edema: Trace  Mental Status: Normal mood and affect. Normal behavior. Normal judgment and thought content.   Urinalysis:      Assessment and Plan:  Pregnancy: [id number] at 6343w4d  1. Pregnancy 28wk labs today. D/w pt regarding BC nv. Pt states mood is better since last visit since coming out of work and feels like less stress - RPR - CBC - HIV antibody (with reflex)  2. Supervision of high risk pregnancy,  antepartum, second trimester Routine care  3. Previous cesarean section complicating pregnancy, antepartum condition or complication -set up repeat later in pregnancy  4. H/O pre-eclampsia in prior pregnancy, currently pregnant, second trimester -continue baby ASA  5. Gestational diabetes mellitus (GDM) affecting pregnancy A2 control Will adjust from gly 5 w/ breakfast and 5 with dinner to 5 at breakfast to 5 before bed given AM fastings in the 120s-130s and 2hr PP dinner in the 100s. Pt told to do this 1wk and if still elevated to to 5/7.5 with dinner Growth scan next week  6. AMA (advanced maternal age) multigravida 35+, second trimester S/p GC  Preterm labor symptoms and general obstetric precautions including but not limited to vaginal bleeding, contractions, leaking of fluid and fetal movement were reviewed in detail with the patient. Please refer to After Visit Summary for other counseling recommendations.  2wks  Leslie Bingharlie Jonea Bukowski, MD

## 2016-03-01 NOTE — Progress Notes (Signed)
15 of ketones in urine

## 2016-03-02 LAB — RPR

## 2016-03-09 ENCOUNTER — Other Ambulatory Visit (HOSPITAL_COMMUNITY): Payer: Self-pay | Admitting: Maternal and Fetal Medicine

## 2016-03-09 ENCOUNTER — Ambulatory Visit (HOSPITAL_COMMUNITY)
Admission: RE | Admit: 2016-03-09 | Discharge: 2016-03-09 | Disposition: A | Discharge status: Home / Self Care | Payer: Medicaid Other | Source: Ambulatory Visit | Attending: Obstetrics and Gynecology | Admitting: Obstetrics and Gynecology

## 2016-03-09 ENCOUNTER — Encounter (HOSPITAL_COMMUNITY): Payer: Self-pay

## 2016-03-09 VITALS — BP 128/83 | HR 80 | Wt 261.5 lb

## 2016-03-09 DIAGNOSIS — O99213 Obesity complicating pregnancy, third trimester: Secondary | ICD-10-CM | POA: Diagnosis not present

## 2016-03-09 DIAGNOSIS — O34219 Maternal care for unspecified type scar from previous cesarean delivery: Secondary | ICD-10-CM | POA: Diagnosis not present

## 2016-03-09 DIAGNOSIS — O09293 Supervision of pregnancy with other poor reproductive or obstetric history, third trimester: Secondary | ICD-10-CM | POA: Diagnosis not present

## 2016-03-09 DIAGNOSIS — O24419 Gestational diabetes mellitus in pregnancy, unspecified control: Secondary | ICD-10-CM

## 2016-03-09 DIAGNOSIS — O24415 Gestational diabetes mellitus in pregnancy, controlled by oral hypoglycemic drugs: Secondary | ICD-10-CM | POA: Diagnosis present

## 2016-03-09 DIAGNOSIS — O98819 Other maternal infectious and parasitic diseases complicating pregnancy, unspecified trimester: Secondary | ICD-10-CM

## 2016-03-09 DIAGNOSIS — O0992 Supervision of high risk pregnancy, unspecified, second trimester: Secondary | ICD-10-CM

## 2016-03-09 DIAGNOSIS — Z3A28 28 weeks gestation of pregnancy: Secondary | ICD-10-CM

## 2016-03-09 DIAGNOSIS — O09523 Supervision of elderly multigravida, third trimester: Secondary | ICD-10-CM | POA: Diagnosis not present

## 2016-03-09 DIAGNOSIS — O09522 Supervision of elderly multigravida, second trimester: Secondary | ICD-10-CM

## 2016-03-09 DIAGNOSIS — O09299 Supervision of pregnancy with other poor reproductive or obstetric history, unspecified trimester: Secondary | ICD-10-CM

## 2016-03-09 DIAGNOSIS — A749 Chlamydial infection, unspecified: Secondary | ICD-10-CM

## 2016-03-09 DIAGNOSIS — O09292 Supervision of pregnancy with other poor reproductive or obstetric history, second trimester: Secondary | ICD-10-CM

## 2016-03-09 IMAGING — US US MFM OB FOLLOW-UP
1 series · 13 of 28 positions shown · non-contrast
Comparison: none

[Series 1: us mfm ob follow-up · 13 of 40 slices shown]
[im 2/40]
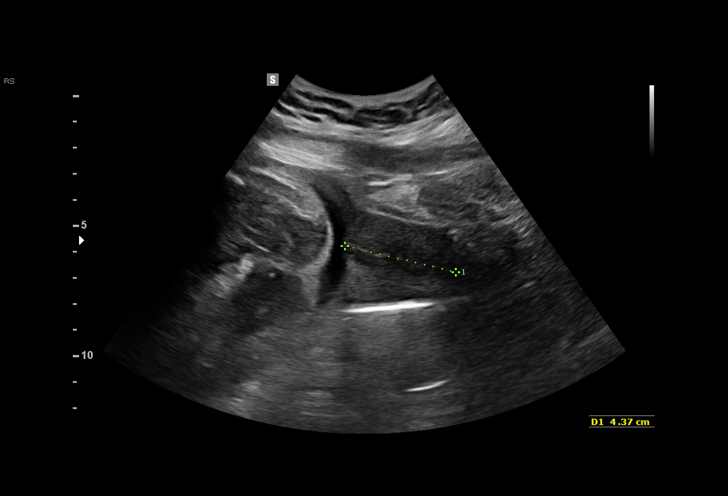
[im 5/40]
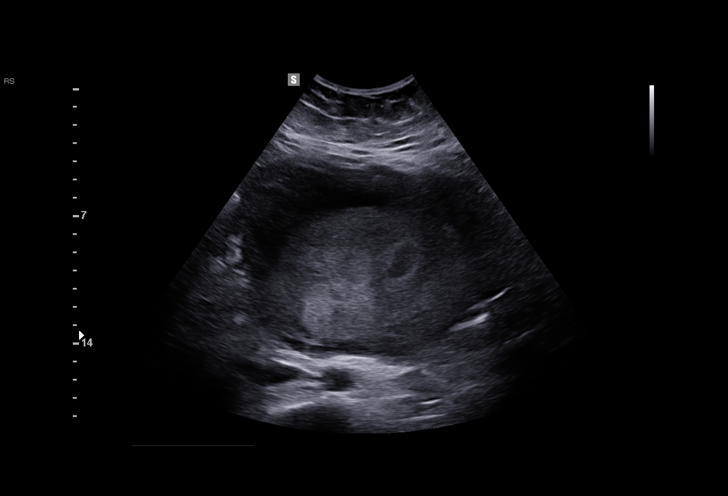
[im 8/40]
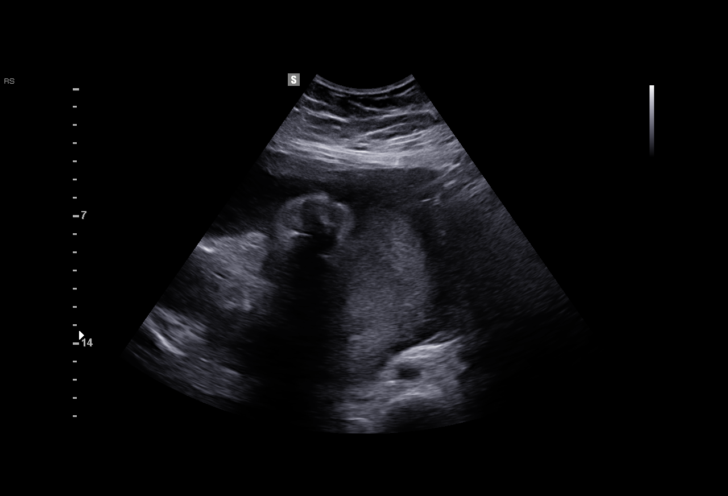
[im 11/40]
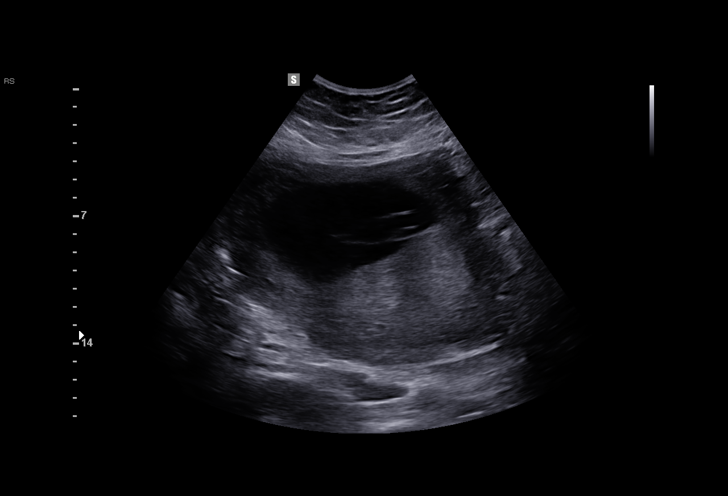
[im 14/40]
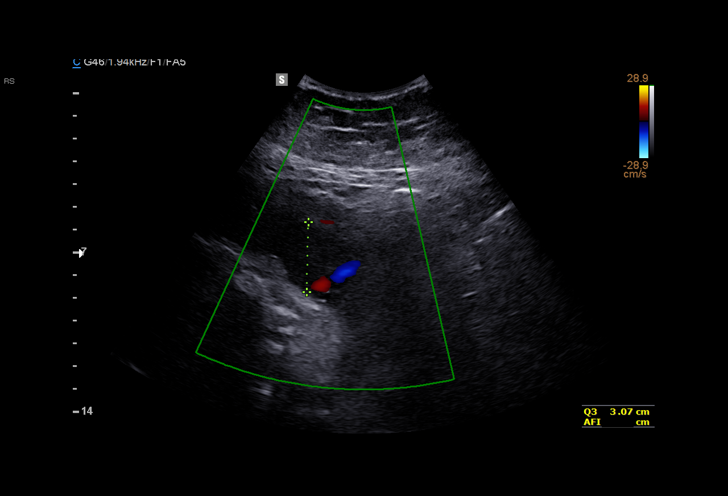
[im 16/40]
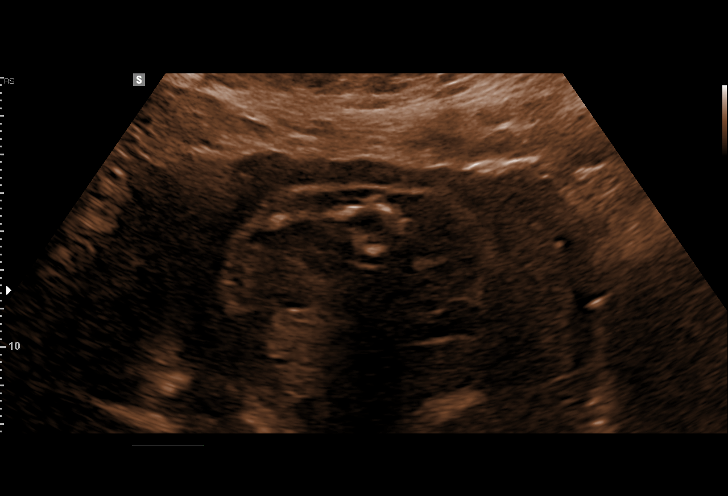
[im 21/40]
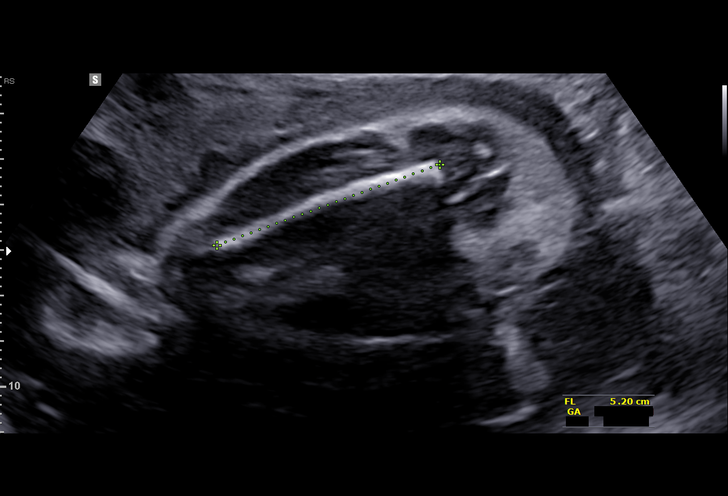
[im 24/40]
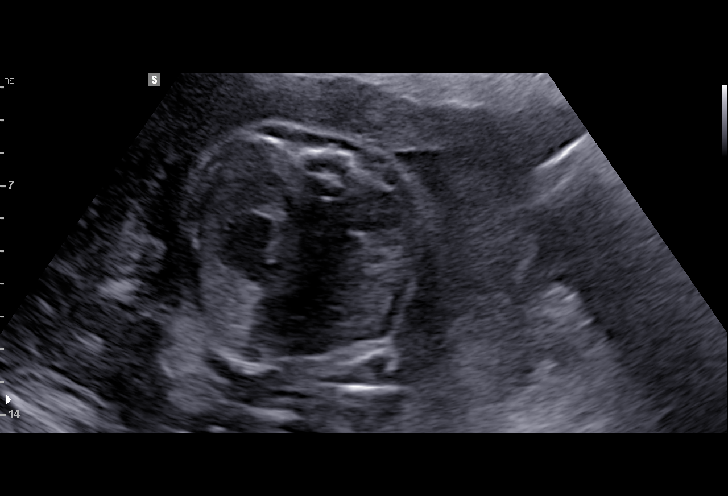
[im 27/40]
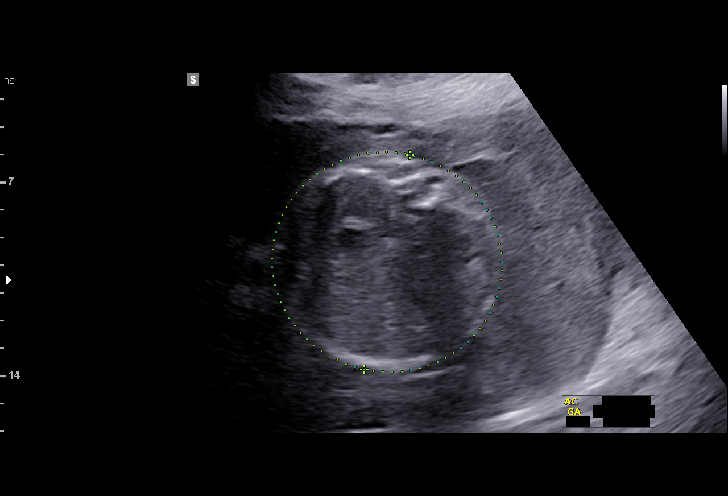
[im 29/40]
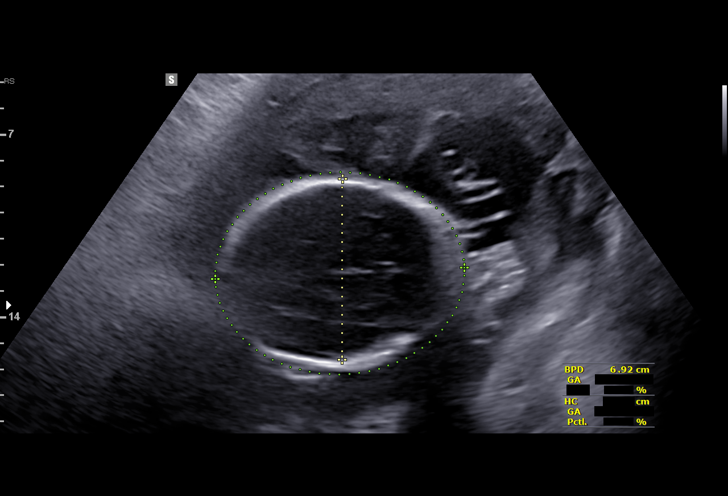
[im 32/40]
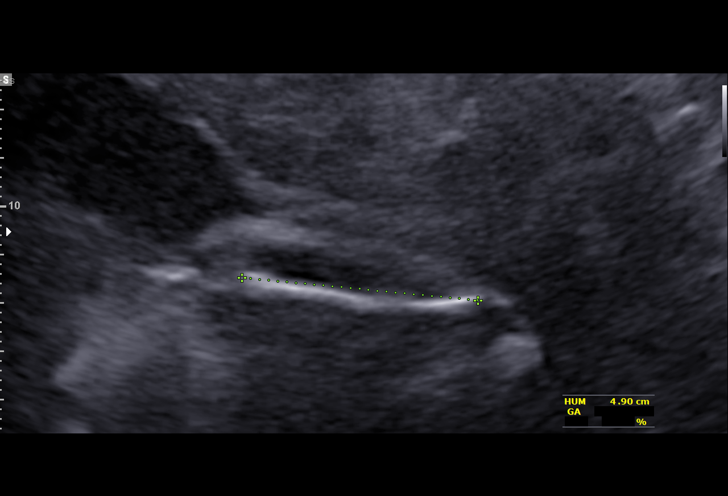
[im 35/40]
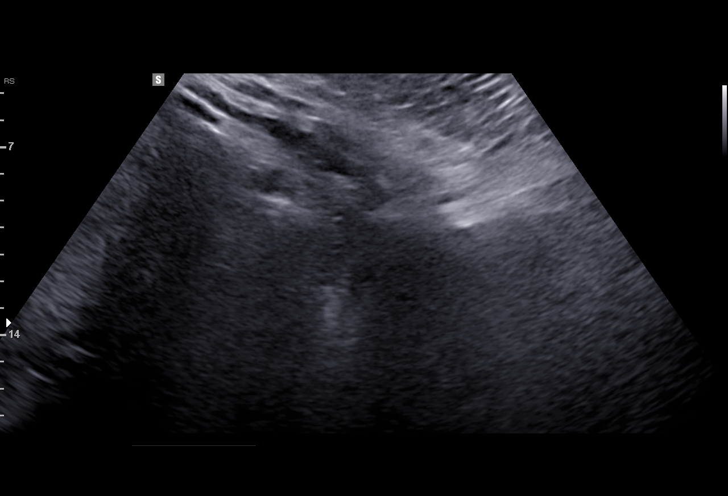
[im 38/40]
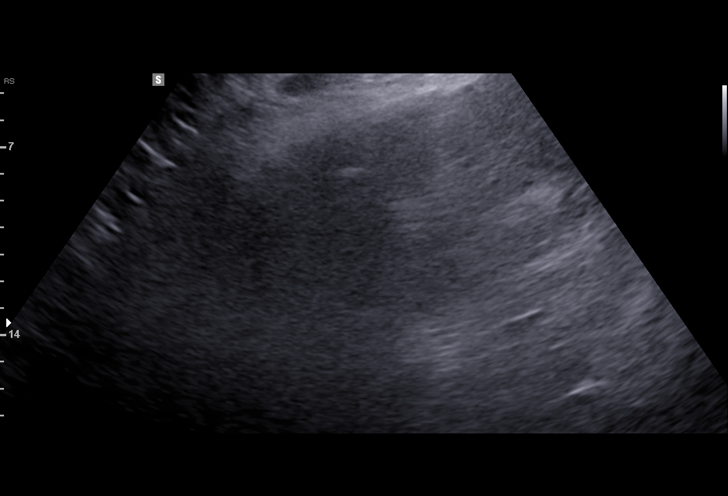

[13 of 28 positions shown; findings below may reference images not displayed]

Hospital Clinic-
Faculty Physician
OB/Gyn Clinic
[REDACTED]

1  XUPENG ASSENMACHER            [id number]      [id number]     [id number]
Indications

28 weeks gestation of pregnancy
Gestational diabetes in pregnancy,
controlled by oral hypoglycemic drugs
(glyburide); A2/B
Poor obstetric history: Previous
preeclampsia / eclampsia/gestational HTN
Obesity complicating pregnancy, third
trimester
Previous cesarean delivery, antepartum X2
Advanced maternal age multigravida 35+,
third trimester, declined testing
OB History

Gravidity:    5         Term:   2        Prem:   0        SAB:   1
TOP:          1       Ectopic:  0        Living: 2
Fetal Evaluation

Num Of Fetuses:     1
Fetal Heart         134
Rate(bpm):
Cardiac Activity:   Observed
Presentation:       Breech
Placenta:           Left lateral/post, above cervical os
Amniotic Fluid
AFI FV:      Subjectively within normal limits

AFI Sum(cm)     %Tile       Largest Pocket(cm)
12.16           30

RUQ(cm)                     LUQ(cm)        LLQ(cm)
6.04
Biometry

BPD:      68.9  mm     G. Age:  27w 5d         12  %    CI:        68.71   %   70 - 86
FL/HC:      19.7   %   19.6 -
HC:      265.6  mm     G. Age:  29w 0d         24  %    HC/AC:      1.05       0.99 -
AC:      252.3  mm     G. Age:  29w 3d         65  %    FL/BPD:     75.9   %   71 - 87
FL:       52.3  mm     G. Age:  27w 6d         15  %    FL/AC:      20.7   %   20 - 24
HUM:      49.2  mm     G. Age:  29w 0d         47  %

Est. FW:    6822  gm    2 lb 13 oz      51  %
Gestational Age

LMP:           33w 4d       Date:   07/18/15                 EDD:   04/23/16
U/S Today:     28w 4d                                        EDD:   05/28/16
Best:          28w 5d    Det. By:   Early Ultrasound         EDD:   05/27/16
(10/08/15)
Anatomy

Cranium:               Previously seen        Aortic Arch:            Previously seen
Cavum:                 Previously seen        Ductal Arch:            Previously seen
Ventricles:            Previously seen        Diaphragm:              Previously seen
Choroid Plexus:        Previously seen        Stomach:                Appears normal, left
sided
Cerebellum:            Previously seen        Abdomen:                Previously seen
Posterior Fossa:       Previously seen        Abdominal Wall:         Previously seen
Nuchal Fold:           Not applicable (>20    Cord Vessels:           Previously seen
wks GA)
Face:                  Orbits previously      Kidneys:                Appear normal
seen
Lips:                  Previously seen        Bladder:                Appears normal
Heart:                 Previously seen        Spine:                  Previously seen
RVOT:                  Previously seen        Upper Extremities:      Previously seen
LVOT:                  Previously seen        Lower Extremities:      Previously seen

Other:  Fetus appears to be a female. Nasal bone previously visualized.
Heels and 5th digit previously seen.
Cervix Uterus Adnexa

Cervix
Length:            4.4  cm.
Normal appearance by transabdominal scan.

Uterus
Single fibroid noted, see table below.

Left Ovary
Not visualized.
Right Ovary
Not visualized.

Adnexa:       No abnormality visualized. No adnexal mass
visualized.
Myomas

Site                     L(cm)      W(cm)      D(cm)      Location
Anterior                 5.7        4.6        3.6        Intramural

Blood Flow                 RI        PI       Comments

Impression

Single IUP at 28w 5d
A2/B GDM, Obesity
The estimated fetal weight is at the 51st %tile
Left lateral / posterior placenta without previa
Uterine myoma noted as described above
Normal amniotic fluid volume
Recommendations

Recommend follow-up ultrasound examination in 4 weeks for
growth
Recommend antenatal testing beginning at 32 weeks

## 2016-03-18 ENCOUNTER — Encounter: Payer: Self-pay | Admitting: Clinical

## 2016-03-18 ENCOUNTER — Encounter: Payer: Self-pay | Admitting: Obstetrics & Gynecology

## 2016-03-18 ENCOUNTER — Ambulatory Visit (INDEPENDENT_AMBULATORY_CARE_PROVIDER_SITE_OTHER): Payer: BLUE CROSS/BLUE SHIELD | Admitting: Obstetrics & Gynecology

## 2016-03-18 VITALS — BP 129/89 | HR 87 | Temp 98.5°F | Wt 262.2 lb

## 2016-03-18 DIAGNOSIS — O24419 Gestational diabetes mellitus in pregnancy, unspecified control: Secondary | ICD-10-CM | POA: Diagnosis not present

## 2016-03-18 DIAGNOSIS — O0993 Supervision of high risk pregnancy, unspecified, third trimester: Secondary | ICD-10-CM

## 2016-03-18 LAB — POCT URINALYSIS DIP (DEVICE)
BILIRUBIN URINE: NEGATIVE
GLUCOSE, UA: 500 mg/dL — AB
Hgb urine dipstick: NEGATIVE
KETONES UR: NEGATIVE mg/dL
Leukocytes, UA: NEGATIVE
NITRITE: NEGATIVE
PROTEIN: 30 mg/dL — AB
Specific Gravity, Urine: 1.015 (ref 1.005–1.030)
Urobilinogen, UA: 1 mg/dL (ref 0.0–1.0)
pH: 6 (ref 5.0–8.0)

## 2016-03-18 MED ORDER — GLYBURIDE 5 MG PO TABS: ORAL_TABLET | ORAL | Status: DC

## 2016-03-18 NOTE — Progress Notes (Signed)
Forgot her BG book  Subjective:  Ashley Pugh is a 35 y.o. [id number] at 7155w0d being seen today for ongoing prenatal care.  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  Patient reports no complaints.  Contractions: Irregular. Vag. Bleeding: None.  Movement: Present. Denies leaking of fluid.   The following portions of the patient's history were reviewed and updated as appropriate: allergies, current medications, past family history, past medical history, past social history, past surgical history and problem list. Problem list updated.  Objective:   Filed Vitals:   03/18/16 1301  BP: 129/89  Pulse: 87  Temp: 98.5 F (36.9 C)  Weight: 262 lb 3.2 oz (118.933 kg)    Fetal Status: Fetal Heart Rate (bpm): 149 Fundal Height: 35 cm Movement: Present     General:  Alert, oriented and cooperative. Patient is in no acute distress.  Skin: Skin is warm and dry. No rash noted.   Cardiovascular: Normal heart rate noted  Respiratory: Normal respiratory effort, no problems with respiration noted  Abdomen: Soft, gravid, appropriate for gestational age. Pain/Pressure: Present     Pelvic:  Cervical exam deferred        Extremities: Normal range of motion.  Edema: Trace  Mental Status: Normal mood and affect. Normal behavior. Normal judgment and thought content.   Urinalysis:      Assessment and Plan:  Pregnancy: [id number] at 6755w0d  1. Gestational diabetes mellitus (GDM) affecting pregnancy States FBS elevated, has just started 7.5 mg HS  glyburide  2. Supervision of high risk pregnancy, antepartum, third trimester RTC 1 week  Preterm labor symptoms and general obstetric precautions including but not limited to vaginal bleeding, contractions, leaking of fluid and fetal movement were reviewed in detail with the patient. Please refer to After Visit Summary for other counseling recommendations.    Adam PhenixJames G Erinn Huskins, MD

## 2016-03-18 NOTE — Patient Instructions (Signed)

## 2016-03-23 ENCOUNTER — Encounter: Payer: Medicaid Other | Admitting: Advanced Practice Midwife

## 2016-03-24 ENCOUNTER — Telehealth: Payer: Self-pay

## 2016-03-24 NOTE — Telephone Encounter (Signed)
We received FMLA leave on this patient. I have already completed these so I have left a message to call us back to see why we are receiving the same paper work.

## 2016-03-25 NOTE — Telephone Encounter (Signed)
Patient has not returned our call. 

## 2016-04-05 ENCOUNTER — Encounter (HOSPITAL_COMMUNITY): Payer: Self-pay

## 2016-04-06 ENCOUNTER — Ambulatory Visit (HOSPITAL_COMMUNITY)
Admission: RE | Admit: 2016-04-06 | Discharge: 2016-04-06 | Disposition: A | Discharge status: Home / Self Care | Payer: Medicaid Other | Source: Ambulatory Visit | Attending: Obstetrics and Gynecology | Admitting: Obstetrics and Gynecology

## 2016-04-06 ENCOUNTER — Other Ambulatory Visit (HOSPITAL_COMMUNITY): Payer: Self-pay | Admitting: Maternal and Fetal Medicine

## 2016-04-06 ENCOUNTER — Encounter (HOSPITAL_COMMUNITY): Payer: Self-pay

## 2016-04-06 DIAGNOSIS — O09293 Supervision of pregnancy with other poor reproductive or obstetric history, third trimester: Secondary | ICD-10-CM | POA: Diagnosis not present

## 2016-04-06 DIAGNOSIS — E669 Obesity, unspecified: Secondary | ICD-10-CM | POA: Insufficient documentation

## 2016-04-06 DIAGNOSIS — O24419 Gestational diabetes mellitus in pregnancy, unspecified control: Secondary | ICD-10-CM

## 2016-04-06 DIAGNOSIS — O99213 Obesity complicating pregnancy, third trimester: Secondary | ICD-10-CM | POA: Diagnosis not present

## 2016-04-06 DIAGNOSIS — O24415 Gestational diabetes mellitus in pregnancy, controlled by oral hypoglycemic drugs: Secondary | ICD-10-CM | POA: Insufficient documentation

## 2016-04-06 DIAGNOSIS — O09292 Supervision of pregnancy with other poor reproductive or obstetric history, second trimester: Secondary | ICD-10-CM

## 2016-04-06 DIAGNOSIS — O34219 Maternal care for unspecified type scar from previous cesarean delivery: Secondary | ICD-10-CM | POA: Insufficient documentation

## 2016-04-06 DIAGNOSIS — A749 Chlamydial infection, unspecified: Secondary | ICD-10-CM

## 2016-04-06 DIAGNOSIS — Z3A32 32 weeks gestation of pregnancy: Secondary | ICD-10-CM | POA: Diagnosis not present

## 2016-04-06 DIAGNOSIS — O98819 Other maternal infectious and parasitic diseases complicating pregnancy, unspecified trimester: Secondary | ICD-10-CM

## 2016-04-06 DIAGNOSIS — O09523 Supervision of elderly multigravida, third trimester: Secondary | ICD-10-CM

## 2016-04-06 DIAGNOSIS — O09522 Supervision of elderly multigravida, second trimester: Secondary | ICD-10-CM

## 2016-04-06 DIAGNOSIS — O0993 Supervision of high risk pregnancy, unspecified, third trimester: Secondary | ICD-10-CM

## 2016-04-06 IMAGING — US US MFM OB FOLLOW-UP
1 series · 14 of 21 positions shown · non-contrast
Comparison: none

[Series 1: us mfm ob follow-up · 21 acquisitions, 14 frames shown]
[im 1/21]
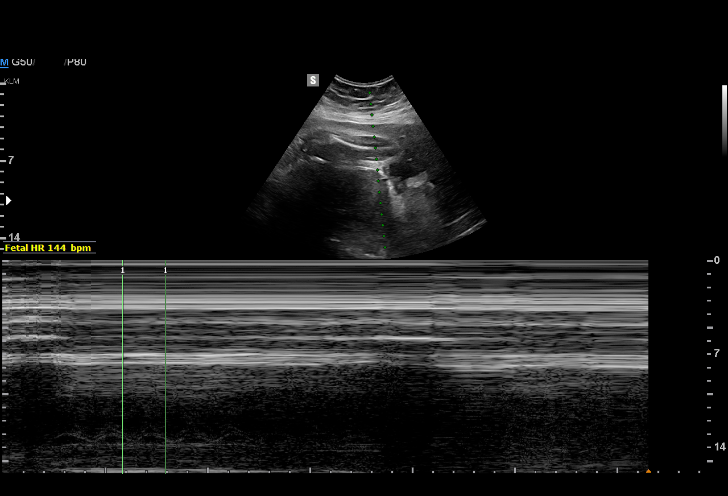
[im 3/21]
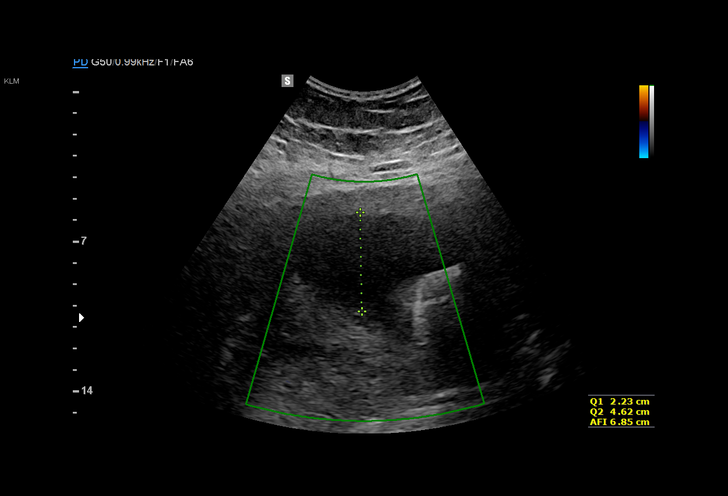
[im 4/21]
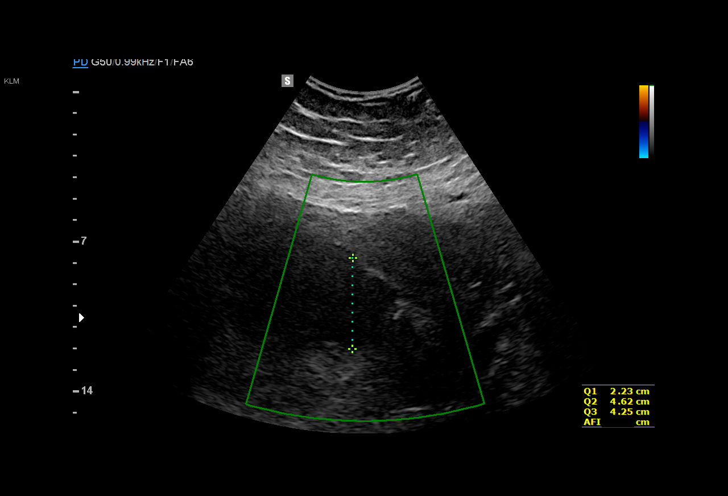
[im 6/21]
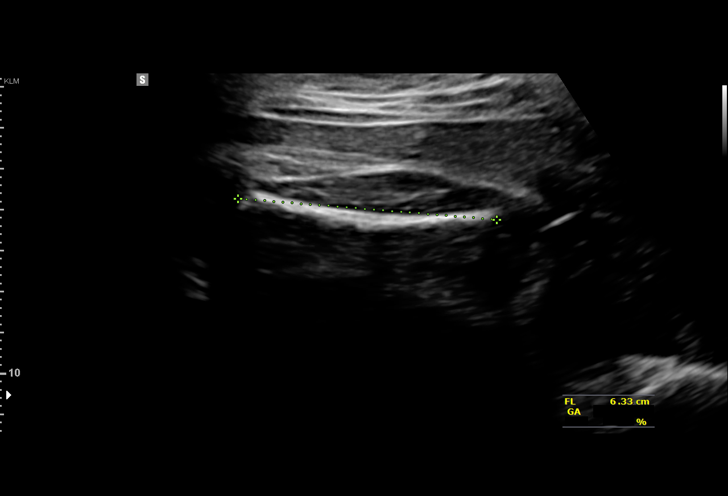
[im 7/21]
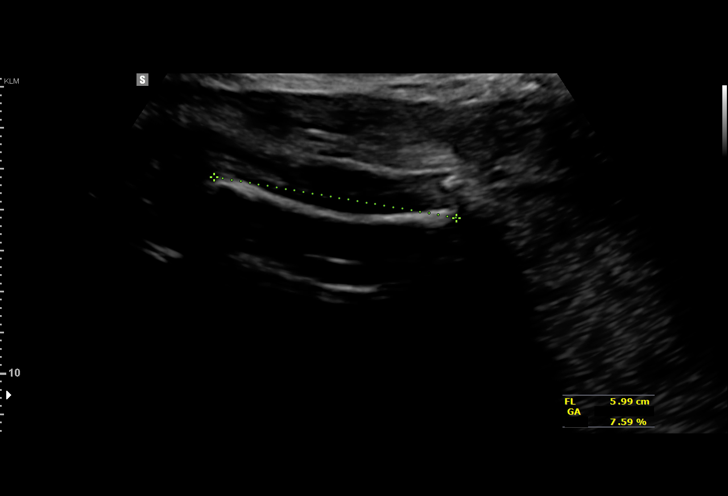
[im 9/21]
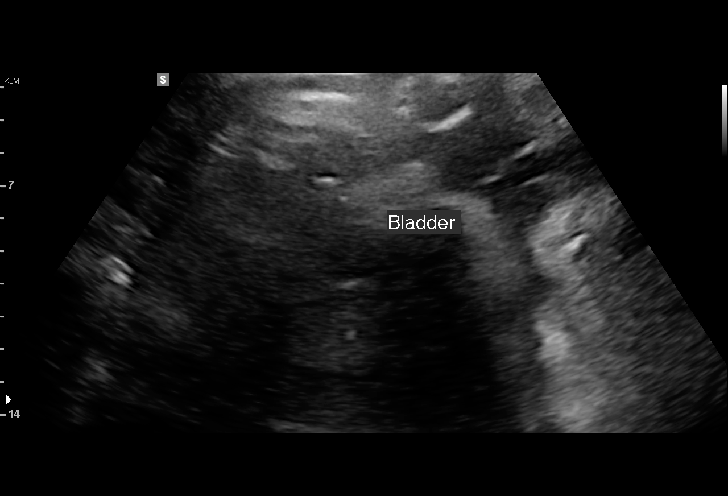
[im 10/21]
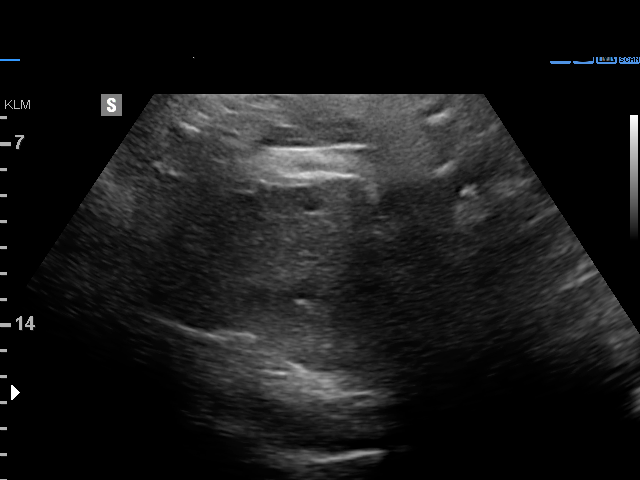
[im 12/21]
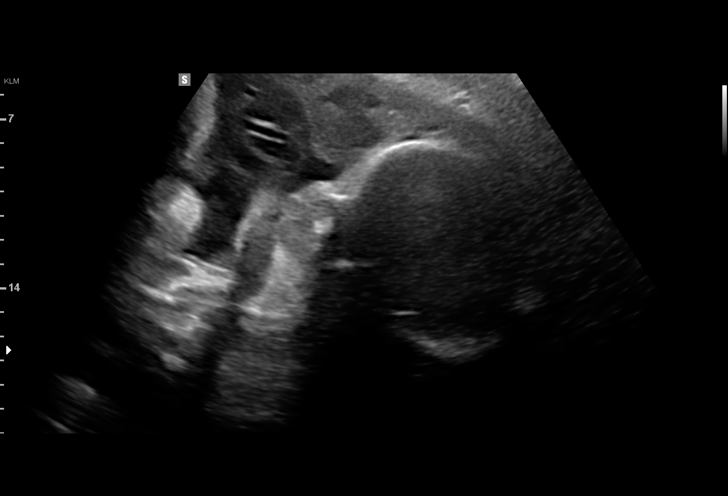
[im 13/21]
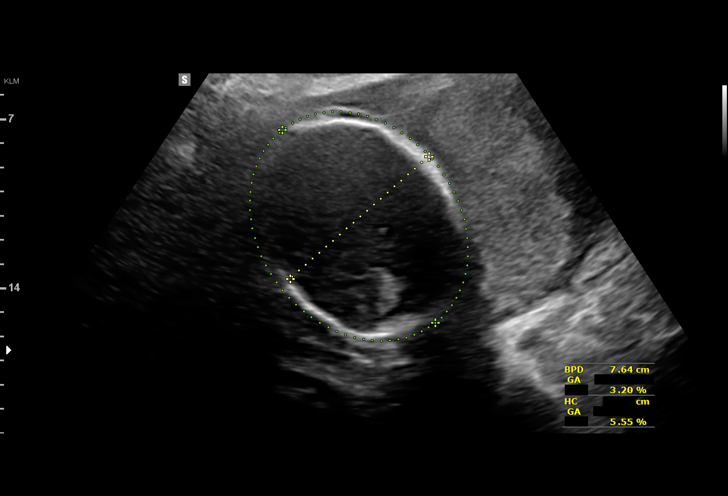
[im 15/21]
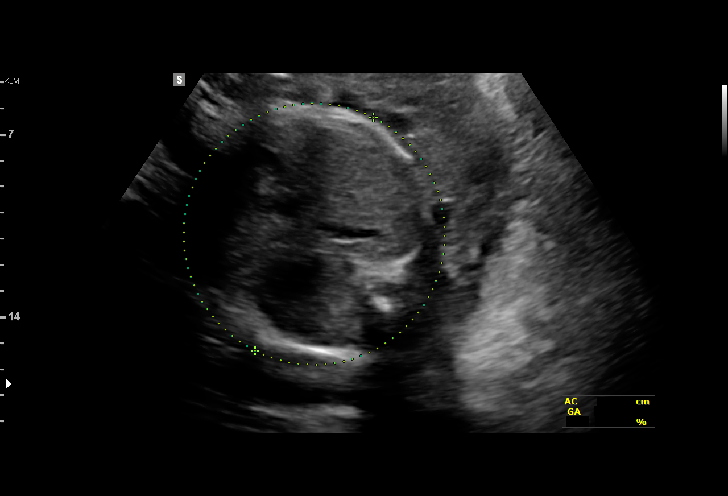
[im 16/21]
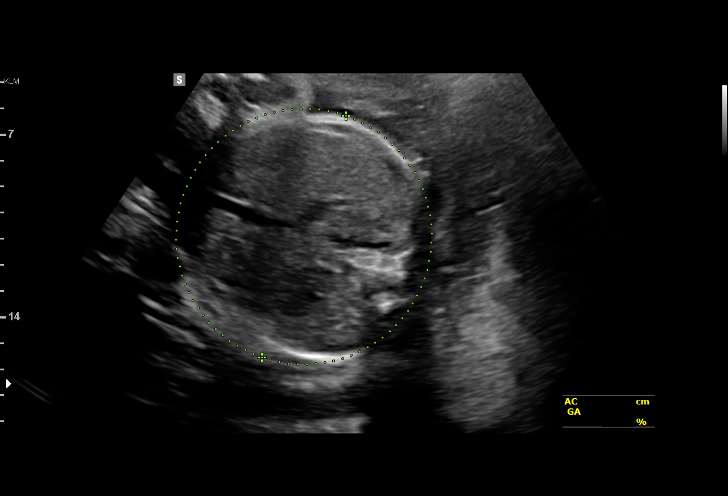
[im 18/21]
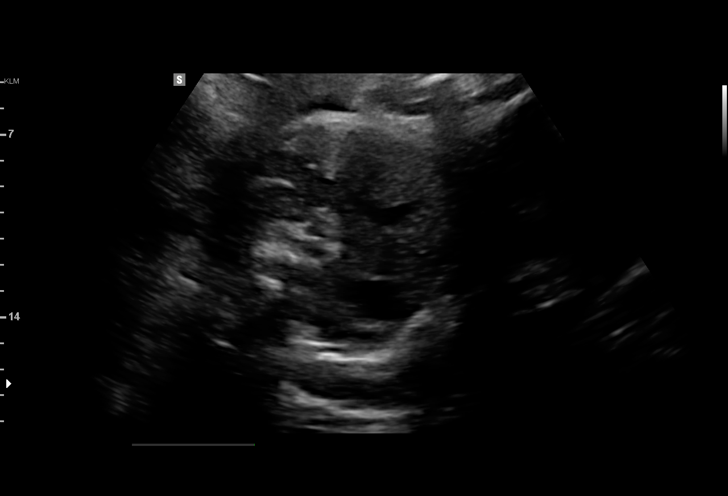
[im 19/21]
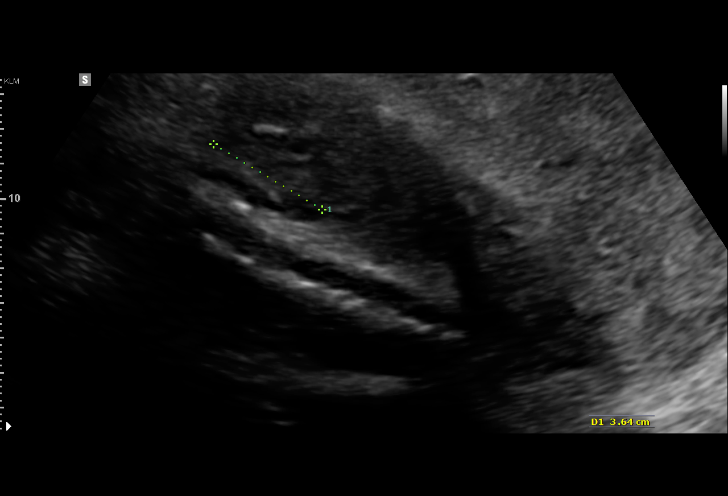
[im 21/21]
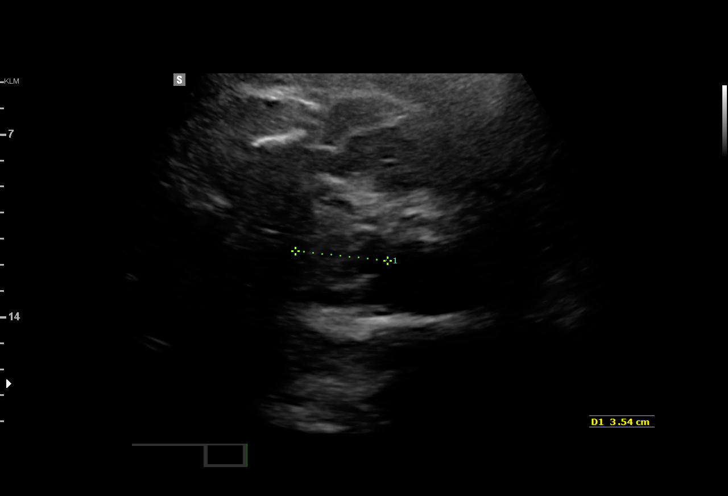

[14 of 21 positions shown; findings below may reference images not displayed]

Hospital Clinic-
Faculty Physician
OB/Gyn Clinic
[REDACTED]

1  RODEL EKE            [id number]      [id number]     [id number]
Indications

32 weeks gestation of pregnancy
Gestational diabetes in pregnancy,
controlled by oral hypoglycemic drugs
(glyburide); A2/B
Poor obstetric history: Previous
preeclampsia / eclampsia/gestational HTN
Obesity complicating pregnancy, third
trimester
Previous cesarean delivery, antepartum X2
Advanced maternal age multigravida 35+,
third trimester, declined testing
OB History

Gravidity:    5         Term:   2        Prem:   0        SAB:   1
TOP:          1       Ectopic:  0        Living: 2
Fetal Evaluation

Num Of Fetuses:     1
Fetal Heart         144
Rate(bpm):
Cardiac Activity:   Observed
Presentation:       Cephalic
Placenta:           Left lateral, above cervical os
Amniotic Fluid
AFI FV:      Subjectively within normal limits

AFI Sum(cm)     %Tile       Largest Pocket(cm)
13.29           42

RUQ(cm)       RLQ(cm)       LUQ(cm)        LLQ(cm)
2.23
Biometry

BPD:      77.4  mm     G. Age:  31w 0d          6  %    CI:        69.57   %   70 - 86
FL/HC:      20.7   %   19.9 -
HC:      296.2  mm     G. Age:  32w 5d         17  %    HC/AC:      0.95       0.96 -
AC:       313   mm     G. Age:  35w 1d       > 97  %    FL/BPD:     79.3   %   71 - 87
FL:       61.4  mm     G. Age:  31w 6d         19  %    FL/AC:      19.6   %   20 - 24

Est. FW:    7797  gm    4 lb 15 oz      76  %
Gestational Age

LMP:           37w 4d       Date:   07/18/15                 EDD:   04/23/16
U/S Today:     32w 5d                                        EDD:   05/27/16
Best:          32w 5d    Det. By:   Early Ultrasound         EDD:   05/27/16
(10/08/15)
Anatomy

Cranium:               Appears normal         Aortic Arch:            Previously seen
Cavum:                 Previously seen        Ductal Arch:            Previously seen
Ventricles:            Previously seen        Diaphragm:              Previously seen
Choroid Plexus:        Previously seen        Stomach:                Appears normal, left
sided
Cerebellum:            Previously seen        Abdomen:                Previously seen
Posterior Fossa:       Previously seen        Abdominal Wall:         Previously seen
Nuchal Fold:           Not applicable (>20    Cord Vessels:           Previously seen
wks GA)
Face:                  Orbits previously      Kidneys:                Appear normal
seen
Lips:                  Previously seen        Bladder:                Appears normal
Heart:                 Previously seen        Spine:                  Previously seen
RVOT:                  Previously seen        Upper Extremities:      Previously seen
LVOT:                  Previously seen        Lower Extremities:      Previously seen

Other:  Fetus appears to be a female. Nasal bone previously visualized.
Heels and 5th digit previously seen.
Cervix Uterus Adnexa

Cervix
Not visualized (advanced GA >01wks)
Impression

Single IUP at 32w 5d
A2/B gestational diabetes
The estimated fetal weight is at the 76th %tile.  The AC
measures > 97th %tile.
Left lateral placenta without previa
Normal amniotic fluid volume
Recommendations

Recommend antenatal testing (2x weekly NSTs with weekly
AFIs)
Ultrasound for interval growth in 4 weeks

## 2016-04-07 ENCOUNTER — Ambulatory Visit (INDEPENDENT_AMBULATORY_CARE_PROVIDER_SITE_OTHER): Payer: BLUE CROSS/BLUE SHIELD | Admitting: Family Medicine

## 2016-04-07 VITALS — BP 138/86 | HR 95 | Wt 267.8 lb

## 2016-04-07 DIAGNOSIS — O09523 Supervision of elderly multigravida, third trimester: Secondary | ICD-10-CM | POA: Diagnosis not present

## 2016-04-07 DIAGNOSIS — O0993 Supervision of high risk pregnancy, unspecified, third trimester: Secondary | ICD-10-CM

## 2016-04-07 DIAGNOSIS — O24419 Gestational diabetes mellitus in pregnancy, unspecified control: Secondary | ICD-10-CM | POA: Diagnosis not present

## 2016-04-07 DIAGNOSIS — O09522 Supervision of elderly multigravida, second trimester: Secondary | ICD-10-CM

## 2016-04-07 LAB — POCT URINALYSIS DIP (DEVICE)
Bilirubin Urine: NEGATIVE
Glucose, UA: 100 mg/dL — AB
Hgb urine dipstick: NEGATIVE
KETONES UR: NEGATIVE mg/dL
Leukocytes, UA: NEGATIVE
Nitrite: NEGATIVE
PH: 6.5 (ref 5.0–8.0)
PROTEIN: 30 mg/dL — AB
Specific Gravity, Urine: 1.02 (ref 1.005–1.030)
UROBILINOGEN UA: 1 mg/dL (ref 0.0–1.0)

## 2016-04-07 MED ORDER — ACCU-CHEK FASTCLIX LANCETS MISC: 1.0000 | Freq: Four times a day (QID) | 12 refills | Status: DC

## 2016-04-07 MED ORDER — GLUCOSE BLOOD VI STRP: ORAL_STRIP | 12 refills | Status: DC

## 2016-04-07 MED ORDER — GLYBURIDE 5 MG PO TABS: ORAL_TABLET | ORAL | 3 refills | Status: DC

## 2016-04-07 NOTE — Progress Notes (Signed)
Subjective:  Ashley Pugh is a 35 y.o. [id number] at [redacted]w[redacted]d being seen today for ongoing prenatal care.  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  GDM: Patient taking glyburide 5mg  in AM and 7.5mg  In PM.  Reports occasional hypoglycemic episodes if she eats late.  Tolerating medication well Fasting: 63-130 2hr PP: mostly < 120, some 130s.  Patient reports no complaints.  Contractions: Irregular. Vag. Bleeding: None.  Movement: Present. Denies leaking of fluid.   The following portions of the patient's history were reviewed and updated as appropriate: allergies, current medications, past family history, past medical history, past social history, past surgical history and problem list. Problem list updated.  Objective:   Vitals:   04/07/16 1449 04/07/16 1457  BP: (!) 142/86 138/86  Pulse: 95   Weight: 267 lb 12.8 oz (121.5 kg)     Fetal Status: Fetal Heart Rate (bpm): NST   Movement: Present     General:  Alert, oriented and cooperative. Patient is in no acute distress.  Skin: Skin is warm and dry. No rash noted.   Cardiovascular: Normal heart rate noted  Respiratory: Normal respiratory effort, no problems with respiration noted  Abdomen: Soft, gravid, appropriate for gestational age. Pain/Pressure: Present     Pelvic: Vag. Bleeding: None     Cervical exam deferred        Extremities: Normal range of motion.     Mental Status: Normal mood and affect. Normal behavior. Normal judgment and thought content.   Urinalysis: Urine  Protein: 1+ Urine Glucose: 1+  Assessment and Plan:  Pregnancy: [id number] at [redacted]w[redacted]d  1. Gestational diabetes mellitus (GDM) affecting pregnancy NST reacitve. Increase Glyburide to 5mg  in AM and 10mg  in PM. [place] yesterday EFW 2242g, AC >97% - Fetal nonstress test  2. AMA (advanced maternal age) multigravida 35+, second trimester  - Fetal nonstress test  3.  Supervision high risk pregnancy  Preterm labor symptoms and general obstetric precautions including but not limited to vaginal bleeding, contractions, leaking of fluid and fetal movement were reviewed in detail with the patient. Please refer to After Visit Summary for other counseling recommendations.  Return in about 5 days (around 04/12/2016) for obfu/nst and 04/15/16 obf/nst/afi.   Levie Heritage, DO

## 2016-04-08 ENCOUNTER — Encounter (HOSPITAL_COMMUNITY): Payer: Self-pay | Admitting: *Deleted

## 2016-04-12 ENCOUNTER — Ambulatory Visit (INDEPENDENT_AMBULATORY_CARE_PROVIDER_SITE_OTHER): Payer: Medicaid Other | Admitting: *Deleted

## 2016-04-12 VITALS — BP 132/85 | HR 92

## 2016-04-12 DIAGNOSIS — O24419 Gestational diabetes mellitus in pregnancy, unspecified control: Secondary | ICD-10-CM | POA: Diagnosis not present

## 2016-04-12 NOTE — Progress Notes (Signed)
7/31 NST reviewed and reactive

## 2016-04-15 ENCOUNTER — Ambulatory Visit (INDEPENDENT_AMBULATORY_CARE_PROVIDER_SITE_OTHER): Payer: Medicaid Other | Admitting: Obstetrics and Gynecology

## 2016-04-15 VITALS — BP 154/96 | HR 79 | Wt 269.8 lb

## 2016-04-15 DIAGNOSIS — O133 Gestational [pregnancy-induced] hypertension without significant proteinuria, third trimester: Secondary | ICD-10-CM | POA: Diagnosis not present

## 2016-04-15 DIAGNOSIS — O24419 Gestational diabetes mellitus in pregnancy, unspecified control: Secondary | ICD-10-CM | POA: Diagnosis present

## 2016-04-15 DIAGNOSIS — O139 Gestational [pregnancy-induced] hypertension without significant proteinuria, unspecified trimester: Secondary | ICD-10-CM | POA: Insufficient documentation

## 2016-04-15 DIAGNOSIS — O98319 Other infections with a predominantly sexual mode of transmission complicating pregnancy, unspecified trimester: Secondary | ICD-10-CM

## 2016-04-15 DIAGNOSIS — O99342 Other mental disorders complicating pregnancy, second trimester: Secondary | ICD-10-CM | POA: Diagnosis not present

## 2016-04-15 DIAGNOSIS — O0993 Supervision of high risk pregnancy, unspecified, third trimester: Secondary | ICD-10-CM

## 2016-04-15 DIAGNOSIS — O34219 Maternal care for unspecified type scar from previous cesarean delivery: Secondary | ICD-10-CM

## 2016-04-15 DIAGNOSIS — O09523 Supervision of elderly multigravida, third trimester: Secondary | ICD-10-CM

## 2016-04-15 DIAGNOSIS — F329 Major depressive disorder, single episode, unspecified: Secondary | ICD-10-CM | POA: Diagnosis not present

## 2016-04-15 DIAGNOSIS — A749 Chlamydial infection, unspecified: Secondary | ICD-10-CM | POA: Diagnosis not present

## 2016-04-15 DIAGNOSIS — O98819 Other maternal infectious and parasitic diseases complicating pregnancy, unspecified trimester: Secondary | ICD-10-CM

## 2016-04-15 LAB — COMPREHENSIVE METABOLIC PANEL
ALT: 8 U/L (ref 6–29)
AST: 10 U/L (ref 10–30)
Albumin: 2.9 g/dL — ABNORMAL LOW (ref 3.6–5.1)
Alkaline Phosphatase: 125 U/L — ABNORMAL HIGH (ref 33–115)
BUN: 5 mg/dL — AB (ref 7–25)
CHLORIDE: 106 mmol/L (ref 98–110)
CO2: 22 mmol/L (ref 20–31)
Calcium: 8.8 mg/dL (ref 8.6–10.2)
Creat: 0.61 mg/dL (ref 0.50–1.10)
GLUCOSE: 166 mg/dL — AB (ref 65–99)
POTASSIUM: 4.1 mmol/L (ref 3.5–5.3)
Sodium: 137 mmol/L (ref 135–146)
Total Bilirubin: 0.3 mg/dL (ref 0.2–1.2)
Total Protein: 5.9 g/dL — ABNORMAL LOW (ref 6.1–8.1)

## 2016-04-15 LAB — CBC
HEMATOCRIT: 33.4 % — AB (ref 35.0–45.0)
HEMOGLOBIN: 10.6 g/dL — AB (ref 11.7–15.5)
MCH: 27.4 pg (ref 27.0–33.0)
MCHC: 31.7 g/dL — AB (ref 32.0–36.0)
MCV: 86.3 fL (ref 80.0–100.0)
MPV: 10.5 fL (ref 7.5–12.5)
Platelets: 206 10*3/uL (ref 140–400)
RBC: 3.87 MIL/uL (ref 3.80–5.10)
RDW: 15.1 % — ABNORMAL HIGH (ref 11.0–15.0)
WBC: 6.5 10*3/uL (ref 3.8–10.8)

## 2016-04-15 LAB — POCT URINALYSIS DIP (DEVICE)
Bilirubin Urine: NEGATIVE
Glucose, UA: 250 mg/dL — AB
HGB URINE DIPSTICK: NEGATIVE
KETONES UR: NEGATIVE mg/dL
LEUKOCYTES UA: NEGATIVE
Nitrite: NEGATIVE
Protein, ur: 30 mg/dL — AB
SPECIFIC GRAVITY, URINE: 1.02 (ref 1.005–1.030)
UROBILINOGEN UA: 1 mg/dL (ref 0.0–1.0)
pH: 6 (ref 5.0–8.0)

## 2016-04-15 NOTE — Progress Notes (Signed)
Prenatal Visit Note Date: 04/15/2016 Clinic: [facility]  Subjective:  Ashley Pugh is a 35 y.o. [id number] at [redacted]w[redacted]d being seen today for ongoing prenatal care.  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  Patient reports no complaints.   Contractions: Irregular. Vag. Bleeding: None.  Movement: Present. Denies leaking of fluid.   The following portions of the patient's history were reviewed and updated as appropriate: allergies, current medications, past family history, past medical history, past social history, past surgical history and problem list. Problem list updated.  Objective:   Vitals:   04/15/16 1015  BP: (!) 145/82  Pulse: 79  Weight: 269 lb 12.8 oz (122.4 kg)   Fetal Status: Fetal Heart Rate (bpm): NST   Movement: Present    NAD No MRGs, normal s1 and s2 CTAB 2+ brachial Abd: gravid, obese, nttp  Urinalysis:      Assessment and Plan:  Pregnancy: [id number] at [redacted]w[redacted]d  1. Gestational diabetes mellitus, antepartum On glyburide 5 and 10. A few slightly elevated ones but <25%. Continue with 2x/week testing. rNST today and normal growth last week with large AC. Needs afi nv.   2. Gestational hypertension, third trimester New diagnosis today. Pt with h/o severe pre-x in prior pregnancies. No s/s now. Will repeat BP but if not severe then can do outpatient labs. Request sent to [facility] to move up c-section to 37wks. Pt doesn't want BTL. Pt to try and void. Precautions given.    3. Supervision of high risk pregnancy, antepartum, third trimester See above  4. Previous cesarean section complicating pregnancy, antepartum condition or complication See above  5. Gestational diabetes mellitus (GDM) affecting pregnancy See above  6. AMA (advanced maternal age) multigravida 35+, third trimester S/p GC  7. Chlamydia infection affecting pregnancy Neg TOC already. Rpt 36wks  8. 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 On no meds  Preterm labor symptoms and general obstetric precautions including but not limited to vaginal bleeding, contractions, leaking of fluid and fetal movement were reviewed in detail with the patient. Please refer to After Visit Summary for other counseling recommendations.  Continue with 2x/week testing Provider visit in 1wk   Deephaven Bing, MD

## 2016-04-16 LAB — PROTEIN / CREATININE RATIO, URINE
Creatinine, Urine: 143 mg/dL (ref 20–320)
Protein Creatinine Ratio: 273 mg/g creat — ABNORMAL HIGH (ref 21–161)
TOTAL PROTEIN, URINE: 39 mg/dL — AB (ref 5–24)

## 2016-04-19 ENCOUNTER — Ambulatory Visit (INDEPENDENT_AMBULATORY_CARE_PROVIDER_SITE_OTHER): Payer: Medicaid Other | Admitting: *Deleted

## 2016-04-19 VITALS — BP 141/89 | HR 90

## 2016-04-19 DIAGNOSIS — O24419 Gestational diabetes mellitus in pregnancy, unspecified control: Secondary | ICD-10-CM | POA: Diagnosis not present

## 2016-04-19 DIAGNOSIS — O133 Gestational [pregnancy-induced] hypertension without significant proteinuria, third trimester: Secondary | ICD-10-CM

## 2016-04-19 DIAGNOSIS — Z36 Encounter for antenatal screening of mother: Secondary | ICD-10-CM

## 2016-04-19 NOTE — Progress Notes (Addendum)
Pt denies H/A or visual disturbances  NST reactive Read by  Lesly DukesLEGGETT,KELLY H., MD

## 2016-04-22 ENCOUNTER — Other Ambulatory Visit: Payer: Self-pay | Admitting: Family Medicine

## 2016-04-22 ENCOUNTER — Ambulatory Visit (INDEPENDENT_AMBULATORY_CARE_PROVIDER_SITE_OTHER): Payer: BLUE CROSS/BLUE SHIELD | Admitting: Family

## 2016-04-22 ENCOUNTER — Observation Stay (HOSPITAL_COMMUNITY)
Admission: AD | Admit: 2016-04-22 | Discharge: 2016-04-23 | Disposition: A | Discharge status: Home / Self Care | Payer: BLUE CROSS/BLUE SHIELD | Source: Ambulatory Visit | Attending: Obstetrics & Gynecology | Admitting: Obstetrics & Gynecology

## 2016-04-22 ENCOUNTER — Encounter (HOSPITAL_COMMUNITY): Payer: Self-pay | Admitting: *Deleted

## 2016-04-22 VITALS — BP 145/88 | HR 83 | Wt 273.6 lb

## 2016-04-22 DIAGNOSIS — O34219 Maternal care for unspecified type scar from previous cesarean delivery: Secondary | ICD-10-CM | POA: Diagnosis not present

## 2016-04-22 DIAGNOSIS — O09293 Supervision of pregnancy with other poor reproductive or obstetric history, third trimester: Secondary | ICD-10-CM

## 2016-04-22 DIAGNOSIS — O133 Gestational [pregnancy-induced] hypertension without significant proteinuria, third trimester: Secondary | ICD-10-CM

## 2016-04-22 DIAGNOSIS — O1493 Unspecified pre-eclampsia, third trimester: Principal | ICD-10-CM

## 2016-04-22 DIAGNOSIS — Z3A35 35 weeks gestation of pregnancy: Secondary | ICD-10-CM | POA: Insufficient documentation

## 2016-04-22 DIAGNOSIS — O99213 Obesity complicating pregnancy, third trimester: Secondary | ICD-10-CM | POA: Insufficient documentation

## 2016-04-22 DIAGNOSIS — O24419 Gestational diabetes mellitus in pregnancy, unspecified control: Secondary | ICD-10-CM

## 2016-04-22 DIAGNOSIS — Z6841 Body Mass Index (BMI) 40.0 and over, adult: Secondary | ICD-10-CM | POA: Diagnosis not present

## 2016-04-22 DIAGNOSIS — O09523 Supervision of elderly multigravida, third trimester: Secondary | ICD-10-CM

## 2016-04-22 DIAGNOSIS — O1490 Unspecified pre-eclampsia, unspecified trimester: Secondary | ICD-10-CM | POA: Diagnosis present

## 2016-04-22 DIAGNOSIS — O24410 Gestational diabetes mellitus in pregnancy, diet controlled: Secondary | ICD-10-CM | POA: Insufficient documentation

## 2016-04-22 DIAGNOSIS — O139 Gestational [pregnancy-induced] hypertension without significant proteinuria, unspecified trimester: Secondary | ICD-10-CM

## 2016-04-22 DIAGNOSIS — O98819 Other maternal infectious and parasitic diseases complicating pregnancy, unspecified trimester: Secondary | ICD-10-CM

## 2016-04-22 DIAGNOSIS — Z7982 Long term (current) use of aspirin: Secondary | ICD-10-CM | POA: Insufficient documentation

## 2016-04-22 DIAGNOSIS — O0993 Supervision of high risk pregnancy, unspecified, third trimester: Secondary | ICD-10-CM

## 2016-04-22 DIAGNOSIS — O09893 Supervision of other high risk pregnancies, third trimester: Secondary | ICD-10-CM

## 2016-04-22 DIAGNOSIS — A749 Chlamydial infection, unspecified: Secondary | ICD-10-CM

## 2016-04-22 LAB — COMPREHENSIVE METABOLIC PANEL
ALBUMIN: 2.8 g/dL — AB (ref 3.5–5.0)
ALT: 11 U/L — AB (ref 14–54)
AST: 13 U/L — AB (ref 15–41)
Alkaline Phosphatase: 102 U/L (ref 38–126)
Anion gap: 5 (ref 5–15)
BUN: 7 mg/dL (ref 6–20)
CHLORIDE: 107 mmol/L (ref 101–111)
CO2: 22 mmol/L (ref 22–32)
CREATININE: 0.49 mg/dL (ref 0.44–1.00)
Calcium: 8.5 mg/dL — ABNORMAL LOW (ref 8.9–10.3)
GFR calc Af Amer: >60 mL/min (ref >60)
GLUCOSE: 132 mg/dL — AB (ref 65–99)
POTASSIUM: 3.5 mmol/L (ref 3.5–5.1)
Sodium: 134 mmol/L — ABNORMAL LOW (ref 135–145)
Total Bilirubin: 0.5 mg/dL (ref 0.3–1.2)
Total Protein: 6.4 g/dL — ABNORMAL LOW (ref 6.5–8.1)

## 2016-04-22 LAB — POCT URINALYSIS DIP (DEVICE)
BILIRUBIN URINE: NEGATIVE
GLUCOSE, UA: NEGATIVE mg/dL
Hgb urine dipstick: NEGATIVE
KETONES UR: NEGATIVE mg/dL
LEUKOCYTES UA: NEGATIVE
Nitrite: NEGATIVE
Urobilinogen, UA: 1 mg/dL (ref 0.0–1.0)
pH: 6 (ref 5.0–8.0)

## 2016-04-22 LAB — CBC
HCT: 31.1 % — ABNORMAL LOW (ref 36.0–46.0)
Hemoglobin: 10.2 g/dL — ABNORMAL LOW (ref 12.0–15.0)
MCH: 27.3 pg (ref 26.0–34.0)
MCHC: 32.8 g/dL (ref 30.0–36.0)
MCV: 83.2 fL (ref 78.0–100.0)
PLATELETS: 193 10*3/uL (ref 150–400)
RBC: 3.74 MIL/uL — AB (ref 3.87–5.11)
RDW: 14.9 % (ref 11.5–15.5)
WBC: 6.7 10*3/uL (ref 4.0–10.5)

## 2016-04-22 LAB — TYPE AND SCREEN
ABO/RH(D): O POS
ANTIBODY SCREEN: NEGATIVE

## 2016-04-22 LAB — PROTEIN / CREATININE RATIO, URINE
CREATININE, URINE: 153 mg/dL
Protein Creatinine Ratio: 0.58 mg/mg{Cre} — ABNORMAL HIGH (ref 0.00–0.15)
Total Protein, Urine: 88 mg/dL

## 2016-04-22 LAB — ABO/RH: ABO/RH(D): O POS

## 2016-04-22 LAB — GLUCOSE, CAPILLARY: GLUCOSE-CAPILLARY: 174 mg/dL — AB (ref 65–99)

## 2016-04-22 MED ORDER — LABETALOL HCL 5 MG/ML IV SOLN
20.0000 mg | Freq: Once | INTRAVENOUS | Status: AC
  Administered 2016-04-22: 20 mg via INTRAVENOUS
  Filled 2016-04-22: qty 4

## 2016-04-22 MED ORDER — GLYBURIDE 5 MG PO TABS
5.0000 mg | ORAL_TABLET | Freq: Every day | ORAL | Status: DC
  Administered 2016-04-23: 5 mg via ORAL
  Filled 2016-04-22 (×2): qty 1

## 2016-04-22 MED ORDER — PRENATAL MULTIVITAMIN CH
1.0000 | ORAL_TABLET | Freq: Every day | ORAL | Status: DC
  Administered 2016-04-23: 1 via ORAL
  Filled 2016-04-22: qty 1

## 2016-04-22 MED ORDER — BETAMETHASONE SOD PHOS & ACET 6 (3-3) MG/ML IJ SUSP
12.0000 mg | Freq: Once | INTRAMUSCULAR | Status: AC
  Administered 2016-04-22: 12 mg via INTRAMUSCULAR
  Filled 2016-04-22: qty 2

## 2016-04-22 MED ORDER — CALCIUM CARBONATE ANTACID 500 MG PO CHEW: 2.0000 | CHEWABLE_TABLET | ORAL | Status: DC | PRN

## 2016-04-22 MED ORDER — BETAMETHASONE SOD PHOS & ACET 6 (3-3) MG/ML IJ SUSP
12.0000 mg | Freq: Once | INTRAMUSCULAR | Status: AC
  Administered 2016-04-23: 12 mg via INTRAMUSCULAR
  Filled 2016-04-22: qty 2

## 2016-04-22 MED ORDER — DOCUSATE SODIUM 100 MG PO CAPS
100.0000 mg | ORAL_CAPSULE | Freq: Every day | ORAL | Status: DC
  Administered 2016-04-23: 100 mg via ORAL
  Filled 2016-04-22: qty 1

## 2016-04-22 MED ORDER — ACETAMINOPHEN 325 MG PO TABS: 650.0000 mg | ORAL_TABLET | ORAL | Status: DC | PRN

## 2016-04-22 MED ORDER — ZOLPIDEM TARTRATE 5 MG PO TABS
5.0000 mg | ORAL_TABLET | Freq: Every evening | ORAL | Status: DC | PRN
  Administered 2016-04-23: 5 mg via ORAL
  Filled 2016-04-22: qty 1

## 2016-04-22 NOTE — Patient Instructions (Signed)

## 2016-04-22 NOTE — MAU Provider Note (Signed)
ANTENATAL HISTORY AND PHYSICAL  Chief Complaint:  Hypertension   Ashley Pugh is a 35 y.o.  [id number]  at 727w0d presenting for Hypertension .   Sent up from clinic for PreE workup after BPs 145/90. History significant for PreE. Has been taking ASA religiously this pregnancy. Endorses an occipital headache that has been off and on over the last week, sharp, has not tried anything. She also endorses "floaters" in her eyes today. She endorses epigastric pain this morning, not present now. She does not take any blood pressure medicine this pregnancy.  Patient states she has been having  none contractions, none vaginal bleeding, intact membranes, with active fetal movement.    Past Medical History:  Diagnosis Date  . Gestational diabetes   . Hx of preeclampsia, prior pregnancy, currently pregnant     Past Surgical History:  Procedure Laterality Date  . BREAST REDUCTION SURGERY    . CESAREAN SECTION      History reviewed. No pertinent family history.  Social History  Substance Use Topics  . Smoking status: Never Smoker  . Smokeless tobacco: Never Used  . Alcohol use No    No Known Allergies  Prescriptions Prior to Admission  Medication Sig Dispense Refill Last Dose  . ACCU-CHEK FASTCLIX LANCETS MISC Inject 1 each into the skin 4 (four) times daily. New dx GDM O24.419 for testing 4 times daily 102 each 12 Taking  . Aromatic Inhalants (VICKS VAPOINHALER) INHA Inhale 1 puff into the lungs daily as needed (for congestion).   Taking  . aspirin 81 MG chewable tablet Chew 81 mg by mouth daily.   Taking  . glucose blood (ACCU-CHEK SMARTVIEW) test strip New DX GDM O24.419 for testing 4 times daily 100 each 12 Taking  . glyBURIDE (DIABETA) 5 MG tablet One tablet by mouth in AM and 2 tablet at dinner 90 tablet 3 Taking  . Prenatal MV-Min-FA-Omega-3 (PRENATAL GUMMIES/DHA & FA PO) Take 2 tablets by mouth daily.   Taking    Review of Systems - Negative except for what is mentioned in  HPI.  Physical Exam  Blood pressure 163/95, pulse 86, temperature 98.6 F (37 C), temperature source Oral, resp. rate 16, last menstrual period 07/18/2015. GENERAL: Well-developed, well-nourished female in no acute distress.  LUNGS: Clear to auscultation bilaterally.  HEART: Regular rate and rhythm. ABDOMEN: Soft, nontender, nondistended, gravid.  EXTREMITIES: Nontender, no edema, 2+ distal pulses. FHT:  Cat 1 Contractions: none   Labs: Results for orders placed or performed during the hospital encounter of 04/22/16 (from the past 24 hour(s))  CBC   Collection Time: 04/22/16  1:03 PM  Result Value Ref Range   WBC 6.7 4.0 - 10.5 K/uL   RBC 3.74 (L) 3.87 - 5.11 MIL/uL   Hemoglobin 10.2 (L) 12.0 - 15.0 g/dL   HCT 45.431.1 (L) 09.836.0 - 11.946.0 %   MCV 83.2 78.0 - 100.0 fL   MCH 27.3 26.0 - 34.0 pg   MCHC 32.8 30.0 - 36.0 g/dL   RDW 14.714.9 82.911.5 - 56.215.5 %   Platelets 193 150 - 400 K/uL  Comprehensive metabolic panel   Collection Time: 04/22/16  1:03 PM  Result Value Ref Range   Sodium 134 (L) 135 - 145 mmol/L   Potassium 3.5 3.5 - 5.1 mmol/L   Chloride 107 101 - 111 mmol/L   CO2 22 22 - 32 mmol/L   Glucose, Bld 132 (H) 65 - 99 mg/dL   BUN 7 6 - 20 mg/dL   Creatinine, Ser  0.49 0.44 - 1.00 mg/dL   Calcium 8.5 (L) 8.9 - 10.3 mg/dL   Total Protein 6.4 (L) 6.5 - 8.1 g/dL   Albumin 2.8 (L) 3.5 - 5.0 g/dL   AST 13 (L) 15 - 41 U/L   ALT 11 (L) 14 - 54 U/L   Alkaline Phosphatase 102 38 - 126 U/L   Total Bilirubin PENDING 0.3 - 1.2 mg/dL   GFR calc non Af Amer >60 >60 mL/min   GFR calc Af Amer >60 >60 mL/min   Anion gap 5 5 - 15  Results for orders placed or performed in visit on 04/22/16 (from the past 24 hour(s))  POCT urinalysis dip (device)   Collection Time: 04/22/16 10:30 AM  Result Value Ref Range   Glucose, UA NEGATIVE NEGATIVE mg/dL   Bilirubin Urine NEGATIVE NEGATIVE   Ketones, ur NEGATIVE NEGATIVE mg/dL   Specific Gravity, Urine >=1.030 1.005 - 1.030   Hgb urine dipstick  NEGATIVE NEGATIVE   pH 6.0 5.0 - 8.0   Protein, ur >=300 (A) NEGATIVE mg/dL   Urobilinogen, UA 1.0 0.0 - 1.0 mg/dL   Nitrite NEGATIVE NEGATIVE   Leukocytes, UA NEGATIVE NEGATIVE    Imaging Studies:  [place] Mfm Ob Follow Up  Result Date: 04/06/2016 OBSTETRICAL ULTRASOUND: This exam was performed within a [facility]. The OB US report was generated in the AS system, and faxed to the ordering physician.  This report is available in the [employer]. See the AS Obstetric US report via the Image Link.   Ashley Pugh is  35 y.o. [id number] at [redacted]w[redacted]d presents with Hypertension .  Plan: Hypertension in pregnancy: clinic urine >300 protein. CBC and CMP reassuring, Pr/Cr - 0.58. Required 1 dose IV labetalol . Observe for additional severe BPs and severe features. Consider C section if severe features.  [name] 8/10/20172:03 PM   OB FELLOW HISTORY AND PHYSICAL ATTESTATION  I have seen and examined this patient; I agree with above documentation in the resident's note.    Ernestina Penna 04/22/2016, 6:42 PM

## 2016-04-22 NOTE — Progress Notes (Signed)
Subjective:  Ashley Pugh is a 35 y.o. [id number] at 194w0d being seen today for ongoing prenatal care.  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  Patient reports headache this AM, no vision changes, or epigastric pain..  Contractions: Irregular. Vag. Bleeding: None.  Movement: Present. Denies leaking of fluid.   The following portions of the patient's history were reviewed and updated as appropriate: allergies, current medications, past family history, past medical history, past social history, past surgical history and problem list. Problem list updated.  Objective:   Vitals:   04/22/16 1038  BP: (!) 145/88  Pulse: 83  Weight: 273 lb 9.6 oz (124.1 kg)    Fetal Status: Fetal Heart Rate (bpm): NST-R Fundal Height: 36 cm Movement: Present     General:  Alert, oriented and cooperative. Patient is in no acute distress.  Skin: Skin is warm and dry. No rash noted.   Cardiovascular: Normal heart rate noted  Respiratory: Normal respiratory effort, no problems with respiration noted  Abdomen: Soft, gravid, appropriate for gestational age. Pain/Pressure: Present     Pelvic:  Cervical exam deferred        Extremities: Normal range of motion.  Edema: Trace  Mental Status: Normal mood and affect. Normal behavior. Normal judgment and thought content.   Urinalysis: Urine Protein: 3+ Urine Glucose: Negative  Assessment and Plan:  Pregnancy: [id number] at 734w0d  1. Gestational hypertension, third trimester -  Fetal nonstress test - Repeat csection scheduled for 05/06/16  2. Gestational diabetes mellitus, antepartum - Well controlled - Fetal nonstress test  3. Supervision of high risk pregnancy, antepartum, third trimester - Culture, beta strep (group b only) - GC/Chlamydia probe amp ([place] - HIV antibody (with reflex)  4.  Previous cesarean section complicating pregnancy, antepartum condition or complication - Repeat scheduled  5. H/O pre-eclampsia in prior pregnancy, currently pregnant, third trimester - MAU for labs  Preterm labor symptoms and general obstetric precautions including but not limited to vaginal bleeding, contractions, leaking of fluid and fetal movement were reviewed in detail with the patient. Please refer to After Visit Summary for other counseling recommendations.  Return in about 1 week (around 04/29/2016) for Ob fu and NST, NST 2/wk and appt weekly.   Eino FarberWalidah Kennith GainN Karim, CNM

## 2016-04-22 NOTE — MAU Note (Signed)
Sent up from clinic for preeclampsia evaluation.

## 2016-04-22 NOTE — Progress Notes (Signed)
Fasting PPB PPL PPD  58-121, 3/12 abnl 54-131, 1/11 abnl 61-100, 0/7 abnl 60-132, 3/9 abnl

## 2016-04-23 ENCOUNTER — Encounter: Payer: Self-pay | Admitting: *Deleted

## 2016-04-23 DIAGNOSIS — O1490 Unspecified pre-eclampsia, unspecified trimester: Secondary | ICD-10-CM | POA: Diagnosis present

## 2016-04-23 DIAGNOSIS — O1493 Unspecified pre-eclampsia, third trimester: Secondary | ICD-10-CM | POA: Diagnosis not present

## 2016-04-23 LAB — GC/CHLAMYDIA PROBE AMP (~~LOC~~) NOT AT ARMC
Chlamydia: NEGATIVE
Neisseria Gonorrhea: NEGATIVE

## 2016-04-23 LAB — GLUCOSE, CAPILLARY: Glucose-Capillary: 133 mg/dL — ABNORMAL HIGH (ref 65–99)

## 2016-04-23 NOTE — H&P (Signed)
ANTENATAL HISTORY AND PHYSICAL  Chief Complaint:  Hypertension   Ashley Pugh is a 35 y.o.  [id number]  at [redacted]w[redacted]d presenting for Hypertension .   Sent up from clinic for PreE workup after BPs 145/90. History significant for PreE. Has been taking ASA religiously this pregnancy. Endorses an occipital headache that has been off and on over the last week, sharp, has not tried anything. She also endorses "floaters" in her eyes today. She endorses epigastric pain this morning, not present now. She does not take any blood pressure medicine this pregnancy.  Patient states she has been having  none contractions, none vaginal bleeding, intact membranes, with active fetal movement.        Past Medical History:  Diagnosis Date  . Gestational diabetes   . Hx of preeclampsia, prior pregnancy, currently pregnant          Past Surgical History:  Procedure Laterality Date  . BREAST REDUCTION SURGERY    . CESAREAN SECTION      History reviewed. No pertinent family history.      Social History  Substance Use Topics  . Smoking status: Never Smoker  . Smokeless tobacco: Never Used  . Alcohol use No    No Known Allergies         Prescriptions Prior to Admission  Medication Sig Dispense Refill Last Dose  . ACCU-CHEK FASTCLIX LANCETS MISC Inject 1 each into the skin 4 (four) times daily. New dx GDM O24.419 for testing 4 times daily 102 each 12 Taking  . Aromatic Inhalants (VICKS VAPOINHALER) INHA Inhale 1 puff into the lungs daily as needed (for congestion).   Taking  . aspirin 81 MG chewable tablet Chew 81 mg by mouth daily.   Taking  . glucose blood (ACCU-CHEK SMARTVIEW) test strip New DX GDM O24.419 for testing 4 times daily 100 each 12 Taking  . glyBURIDE (DIABETA) 5 MG tablet One tablet by mouth in AM and 2 tablet at dinner 90 tablet 3 Taking  . Prenatal MV-Min-FA-Omega-3 (PRENATAL GUMMIES/DHA & FA PO) Take 2 tablets by mouth daily.   Taking    Review of Systems -  Negative except for what is mentioned in HPI.  Physical Exam  Blood pressure 163/95, pulse 86, temperature 98.6 F (37 C), temperature source Oral, resp. rate 16, last menstrual period 07/18/2015. GENERAL: Well-developed, well-nourished female in no acute distress.  LUNGS: Clear to auscultation bilaterally.  HEART: Regular rate and rhythm. ABDOMEN: Soft, nontender, nondistended, gravid.  EXTREMITIES: Nontender, no edema, 2+ distal pulses. FHT:  Cat 1 Contractions: none   Labs:      Results for orders placed or performed during the hospital encounter of 04/22/16 (from the past 24 hour(s))  CBC   Collection Time: 04/22/16  1:03 PM  Result Value Ref Range   WBC 6.7 4.0 - 10.5 K/uL   RBC 3.74 (L) 3.87 - 5.11 MIL/uL   Hemoglobin 10.2 (L) 12.0 - 15.0 g/dL   HCT 14.7 (L) 82.9 - 56.2 %   MCV 83.2 78.0 - 100.0 fL   MCH 27.3 26.0 - 34.0 pg   MCHC 32.8 30.0 - 36.0 g/dL   RDW 13.0 86.5 - 78.4 %   Platelets 193 150 - 400 K/uL  Comprehensive metabolic panel   Collection Time: 04/22/16  1:03 PM  Result Value Ref Range   Sodium 134 (L) 135 - 145 mmol/L   Potassium 3.5 3.5 - 5.1 mmol/L   Chloride 107 101 - 111 mmol/L   CO2 22 22 -  32 mmol/L   Glucose, Bld 132 (H) 65 - 99 mg/dL   BUN 7 6 - 20 mg/dL   Creatinine, Ser 1.610.49 0.44 - 1.00 mg/dL   Calcium 8.5 (L) 8.9 - 10.3 mg/dL   Total Protein 6.4 (L) 6.5 - 8.1 g/dL   Albumin 2.8 (L) 3.5 - 5.0 g/dL   AST 13 (L) 15 - 41 U/L   ALT 11 (L) 14 - 54 U/L   Alkaline Phosphatase 102 38 - 126 U/L   Total Bilirubin PENDING 0.3 - 1.2 mg/dL   GFR calc non Af Amer >60 >60 mL/min   GFR calc Af Amer >60 >60 mL/min   Anion gap 5 5 - 15  Results for orders placed or performed in visit on 04/22/16 (from the past 24 hour(s))  POCT urinalysis dip (device)   Collection Time: 04/22/16 10:30 AM  Result Value Ref Range   Glucose, UA NEGATIVE NEGATIVE mg/dL   Bilirubin Urine NEGATIVE NEGATIVE   Ketones, ur NEGATIVE NEGATIVE  mg/dL   Specific Gravity, Urine >=1.030 1.005 - 1.030   Hgb urine dipstick NEGATIVE NEGATIVE   pH 6.0 5.0 - 8.0   Protein, ur >=300 (A) NEGATIVE mg/dL   Urobilinogen, UA 1.0 0.0 - 1.0 mg/dL   Nitrite NEGATIVE NEGATIVE   Leukocytes, UA NEGATIVE NEGATIVE    Imaging Studies:   ImagingResults  [place] Mfm Ob Follow Up  Result Date: 04/06/2016 OBSTETRICAL ULTRASOUND: This exam was performed within a [facility] Department. The OB US report was generated in the AS system, and faxed to the ordering physician.  This report is available in the [employer]. See the AS Obstetric US report via the Image Link.    Assessment: Ashley Pugh is  35 y.o. [id number] at 6868w0d presents with Hypertension .  Plan: Hypertension in pregnancy: clinic urine >300 protein. CBC and CMP reassuring, Pr/Cr - 0.58. Required 1 dose IV labetalol 20mg . Observe for additional severe BPs and severe features. Consider C section if severe features.  Loni MuseKate Timberlake 8/10/20172:03 PM   OB FELLOW HISTORY AND PHYSICAL ATTESTATION  I have seen and examined this patient; I agree with above documentation in the resident's note.   Ernestina PennaNicholas Schenk 04/22/2016, 6:42 PM   Attestation of Attending Supervision of Obstetric Fellow: Evaluation and management procedures were performed by the Obstetric Fellow under my supervision and collaboration.  I have reviewed the Obstetric Fellow's note and chart, and I agree with the management and plan.  Jaynie CollinsUGONNA  Tammatha Cobb, MD, FACOG Attending Obstetrician & Gynecologist Faculty Practice, [facility]

## 2016-04-23 NOTE — Discharge Instructions (Signed)

## 2016-04-23 NOTE — Progress Notes (Addendum)
Initial Nutrition Assessment  DOCUMENTATION CODES:  Morbid obesity  INTERVENTION:  Carbohydrate modified gestational diabetic diet  NUTRITION DIAGNOSIS:  Increased nutrient needs related to  (pregnancy and fetal growth requirements) as evidenced by  (35 weeks IUP).  GOAL:  Patient will meet greater than or equal to 90% of their needs  MONITOR:  Labs, Weight trends  REASON FOR ASSESSMENT:  Antenatal, Gestational Diabetes   ASSESSMENT:  35 1/7 weeks, adm with preeclampsia.  GDM with good control per PNR. Weight at initial prenatal visit 256 lbs, BMI 44.1. 17 lb weight gain  Diet Order:  Diet gestational carb mod Room service appropriate? Yes; Fluid consistency: Thin Height:   Ht Readings from Last 1 Encounters:  04/22/16 5\' 4"  (1.626 m)  Weight:   Wt Readings from Last 1 Encounters:  04/22/16 273 lb (123.8 kg)   Ideal Body Weight:    120 lbs BMI:  Body mass index is 46.86 kg/m.  Estimated Nutritional Needs:  Kcal:  2400-2600 Protein:  105-115 g Fluid:  2.7 L  EDUCATION NEEDS:  No education needs identified at this time (Pt followed in [facility]. recieved GDM education on 11/24/15)  Elisabeth CaraKatherine Fern Asmar M.Odis LusterEd. R.D. LDN Neonatal Nutrition Support Specialist/RD III Pager [phone]      Phone [phone]

## 2016-04-23 NOTE — Progress Notes (Signed)
Inpatient Diabetes Program Recommendations  AACE/ADA: New Consensus Statement on Inpatient Glycemic Control (2015)  Target Ranges:  Prepandial:   less than 140 mg/dL      Peak postprandial:   less than 180 mg/dL (1-2 hours)      Critically ill patients:  140 - 180 mg/dL   Results for Ashley Pugh, Ashley Pugh (MRN [id number]) as of 04/23/2016 07:57  Ref. Range 04/22/2016 23:07  Glucose-Capillary Latest Ref Range: 65 - 99 mg/dL 409174 (H)   Review of Glycemic Control  Diabetes history: GDM Outpatient Diabetes medications: Glyburide 5 mg QAM, Glyburide 10 mg QPM Current orders for Inpatient glycemic control: Glyburide 5 mg QAM  Inpatient Diabetes Program Recommendations: Correction (SSI): Note patient received Betamethasone yesterday and will receive again today so anticipate glucose to be elevated. While inpatient, please consider using the Diabetic Pregnant Patient order set and order CBGs and Novolog 0-14 units QID (fasting and 2 hour post prandial).  Thanks, Orlando PennerMarie Asah Lamay, RN, MSN, CDE Diabetes Coordinator Inpatient Diabetes Program [phone] (Team Pager from 8am to 5pm) [phone] ([facility] office) [phone] [facility] office) [phone] [facility] office)

## 2016-04-23 NOTE — Progress Notes (Signed)
[place] COMPREHENSIVE PROGRESS NOTE  Ashley Pugh is a 35 y.o. [id number] at 31w1dwho is admitted for preeclampsia.  Estimated Date of Delivery: 05/27/16 Fetal presentation is unsure.  Length of Stay:  1 Days. Admitted 04/22/2016  Subjective: Denies any headaches, visual symptoms, RUQ/epigastric symptoms Patient reports good fetal movement.  She reports no uterine contractions, no bleeding and no loss of fluid per vagina.  Vitals:  Blood pressure (!) 122/59, pulse 92, temperature 98.2 F (36.8 C), temperature source Oral, resp. rate 18, height 5' 4"  (1.626 m), weight 273 lb (123.8 kg), last menstrual period 07/18/2015. Physical Examination: CONSTITUTIONAL: Well-developed, well-nourished female in no acute distress.  HENT:  Normocephalic, atraumatic, External right and left ear normal. Oropharynx is clear and moist EYES: Conjunctivae and EOM are normal. Pupils are equal, round, and reactive to light. No scleral icterus.  NECK: Normal range of motion, supple, no masses SKIN: Skin is warm and dry. No rash noted. Not diaphoretic. No erythema. No pallor. [place] Alert and oriented to person, place, and time. Normal reflexes, muscle tone coordination. No cranial nerve deficit noted. PSYCHIATRIC: Normal mood and affect. Normal behavior. Normal judgment and thought content. CARDIOVASCULAR: Normal heart rate noted, regular rhythm RESPIRATORY: Effort and breath sounds normal, no problems with respiration noted MUSCULOSKELETAL: Normal range of motion. No edema and no tenderness. 2+ DTRs ABDOMEN: Soft, nontender, nondistended, gravid. CERVIX:  Deferred  Fetal monitoring: FHR: 145 bpm, Variability: moderate, Accelerations: Present, Decelerations: Absent  Uterine activity: Noone  Results for orders placed or performed during the hospital encounter of 04/22/16 (from the past 48 hour(s))  CBC     Status: Abnormal   Collection Time: 04/22/16  1:03 PM  Result Value Ref Range    WBC 6.7 4.0 - 10.5 K/uL   RBC 3.74 (L) 3.87 - 5.11 MIL/uL   Hemoglobin 10.2 (L) 12.0 - 15.0 g/dL   HCT 31.1 (L) 36.0 - 46.0 %   MCV 83.2 78.0 - 100.0 fL   MCH 27.3 26.0 - 34.0 pg   MCHC 32.8 30.0 - 36.0 g/dL   RDW 14.9 11.5 - 15.5 %   Platelets 193 150 - 400 K/uL  Comprehensive metabolic panel     Status: Abnormal   Collection Time: 04/22/16  1:03 PM  Result Value Ref Range   Sodium 134 (L) 135 - 145 mmol/L   Potassium 3.5 3.5 - 5.1 mmol/L   Chloride 107 101 - 111 mmol/L   CO2 22 22 - 32 mmol/L   Glucose, Bld 132 (H) 65 - 99 mg/dL   BUN 7 6 - 20 mg/dL   Creatinine, Ser 0.49 0.44 - 1.00 mg/dL   Calcium 8.5 (L) 8.9 - 10.3 mg/dL   Total Protein 6.4 (L) 6.5 - 8.1 g/dL   Albumin 2.8 (L) 3.5 - 5.0 g/dL   AST 13 (L) 15 - 41 U/L   ALT 11 (L) 14 - 54 U/L   Alkaline Phosphatase 102 38 - 126 U/L   Total Bilirubin 0.5 0.3 - 1.2 mg/dL   GFR calc non Af Amer >60 >60 mL/min   GFR calc Af Amer >60 >60 mL/min    Comment: (NOTE) The eGFR has been calculated using the CKD EPI equation. This calculation has not been validated in all clinical situations. eGFR's persistently <60 mL/min signify possible Chronic Kidney Disease.    Anion gap 5 5 - 15  Protein / creatinine ratio, urine     Status: Abnormal   Collection Time: 04/22/16  1:34 PM  Result Value Ref Range   Creatinine, Urine 153.00 mg/dL   Total Protein, Urine 88 mg/dL    Comment: NO NORMAL RANGE ESTABLISHED FOR THIS TEST   Protein Creatinine Ratio 0.58 (H) 0.00 - 0.15 mg/mg[Cre]  Type and screen [place]     Status: None   Collection Time: 04/22/16  4:21 PM  Result Value Ref Range   ABO/RH(D) O POS    Antibody Screen NEG    Sample Expiration 04/25/2016   ABO/Rh     Status: None   Collection Time: 04/22/16  4:21 PM  Result Value Ref Range   ABO/RH(D) O POS   Glucose, capillary     Status: Abnormal   Collection Time: 04/22/16 11:07 PM  Result Value Ref Range   Glucose-Capillary 174 (H) 65 - 99 mg/dL     No results found.  Current scheduled medications . betamethasone acetate-betamethasone sodium phosphate  12 mg Intramuscular Once  . docusate sodium  100 mg Oral Daily  . glyBURIDE  5 mg Oral Q breakfast  . prenatal multivitamin  1 tablet Oral Q1200    I have reviewed the patient's current medications.  ASSESSMENT: Principal Problem:   Preeclampsia   PLAN: Continue monitoring BP for now No medications indicated If she has BP >160/110 or other severe features, will move towards cesarean delivery Continue clear liquids for now Continue routine antenatal care.   Verita Schneiders, MD, [place] Attending [place], [facility]

## 2016-04-23 NOTE — Progress Notes (Signed)
Pt given discharge instructions, kick counts and Pre-e s/s reviewed. Pt verbalizes understanding.

## 2016-04-23 NOTE — Discharge Summary (Signed)
Physician Discharge Summary  Patient ID: Ashley Pugh MRN: [id number] DOB/AGE: Mar 21, 1981 35 y.o.  Admit date: 04/22/2016 Discharge date: 04/23/2016   Discharge Diagnoses:  Principal Problem:   Preeclampsia   Consults: None  Significant Diagnostic Studies: labs:  CBC    Component Value Date/Time   WBC 6.7 04/22/2016 1303   RBC 3.74 (L) 04/22/2016 1303   HGB 10.2 (L) 04/22/2016 1303   HCT 31.1 (L) 04/22/2016 1303   PLT 193 04/22/2016 1303   MCV 83.2 04/22/2016 1303   MCH 27.3 04/22/2016 1303   MCHC 32.8 04/22/2016 1303   RDW 14.9 04/22/2016 1303   LYMPHSABS 1.8 11/17/2015 1018   MONOABS 0.3 11/17/2015 1018   EOSABS 0.0 11/17/2015 1018   BASOSABS 0.0 11/17/2015 1018   CMP Latest Ref Rng & Units 04/22/2016 04/15/2016 12/22/2015  Glucose 65 - 99 mg/dL 409(W) 119(J) 478(G)  BUN 6 - 20 mg/dL 7 5(L) 6(L)  Creatinine 0.44 - 1.00 mg/dL 9.56 2.13 0.86(V)  Sodium 135 - 145 mmol/L 134(L) 137 136  Potassium 3.5 - 5.1 mmol/L 3.5 4.1 3.8  Chloride 101 - 111 mmol/L 107 106 104  CO2 22 - 32 mmol/L Calcium 8.9 - 10.3 mg/dL 7.8(I) 8.8 9.0  Total Protein 6.5 - 8.1 g/dL 6.4(L) 5.9(L) -  Total Bilirubin 0.3 - 1.2 mg/dL 0.5 0.3 -  Alkaline Phos 38 - 126 U/L 102 125(H) -  AST 15 - 41 U/L 13(L) 10 -  ALT 14 - 54 U/L 11(L) 8 -   CMP     Component Value Date/Time   NA 134 (L) 04/22/2016 1303   K 3.5 04/22/2016 1303   CL 107 04/22/2016 1303   CO2 22 04/22/2016 1303   GLUCOSE 132 (H) 04/22/2016 1303   BUN 7 04/22/2016 1303   CREATININE 0.49 04/22/2016 1303   CREATININE 0.61 04/15/2016 1122   CALCIUM 8.5 (L) 04/22/2016 1303   PROT 6.4 (L) 04/22/2016 1303   ALBUMIN 2.8 (L) 04/22/2016 1303   AST 13 (L) 04/22/2016 1303   ALT 11 (L) 04/22/2016 1303   ALKPHOS 102 04/22/2016 1303   BILITOT 0.5 04/22/2016 1303   GFRNONAA >60 04/22/2016 1303   GFRAA >60 04/22/2016 1303   Ref Range & Units 1d ago   Sodium 135 - 145 mmol/L 134   Potassium 3.5 - 5.1 mmol/L 3.5  Chloride 101 -  111 mmol/L 107  CO2 22 - 32 mmol/L 22  Glucose, Bld 65 - 99 mg/dL 696   BUN 6 - 20 mg/dL 7  Creatinine, Ser 2.95 - 1.00 mg/dL 2.84  Calcium 8.9 - 13.2 mg/dL 8.5   Total Protein 6.5 - 8.1 g/dL 6.4   Albumin 3.5 - 5.0 g/dL 2.8   AST 15 - 41 U/L 13   ALT 14 - 54 U/L 11   Alkaline Phosphatase 38 - 126 U/L 102     Creatinine, Urine mg/dL 440.10  Total Protein, Urine mg/dL 88  Comments: NO NORMAL RANGE ESTABLISHED FOR THIS TEST  Protein Creatinine Ratio 0.00 - 0.15 mg/mg      Hospital Course: Patient admitted for elevated BP at 35 0/7 wks. She reported headache and > 300 prot in urine in the office. Patient received BMZ x 2 and was observed. She had some BP's in the severe range which resolved after 1 dose of IV labetalol. Her BS were mildly elevated following BMZ. She was not started on meds for BP and is sent home with pre-eclampsia precautions.  Disposition: 01-Home or  Self Care  Discharged Condition: fair  Discharge Instructions    Discharge activity:  Bathroom / Shower only    Complete by:  As directed   Discharge activity:  No Restrictions    Complete by:  As directed   Discharge diet:  No restrictions    Complete by:  As directed   Discharge instructions    Complete by:  As directed   Let us know if you have unrelenting headache   Fetal Kick Count:  Lie on our left side for one hour after a meal, and count the number of times your baby kicks.  If it is less than 5 times, get up, move around and drink some juice.  Repeat the test 30 minutes later.  If it is still less than 5 kicks in an hour, notify your doctor.    Complete by:  As directed   LABOR:  When conractions begin, you should start to time them from the beginning of one contraction to the beginning  of the next.  When contractions are 5 - 10 minutes apart or less and have been regular for at least an hour, you should call your health care provider.    Complete by:  As directed   Notify physician for bleeding from the  vagina    Complete by:  As directed   Notify physician for blurring of vision or spots before the eyes    Complete by:  As directed   Notify physician for chills or fever    Complete by:  As directed   Notify physician for fainting spells, "black outs" or loss of consciousness    Complete by:  As directed   Notify physician for increase in vaginal discharge    Complete by:  As directed   Notify physician for leaking of fluid    Complete by:  As directed   Notify physician for pain or burning when urinating    Complete by:  As directed   Notify physician for pelvic pressure (sudden increase)    Complete by:  As directed   Notify physician for severe or continued nausea or vomiting    Complete by:  As directed   Notify physician for sudden gushing of fluid from the vagina (with or without continued leaking)    Complete by:  As directed   Notify physician for sudden, constant, or occasional abdominal pain    Complete by:  As directed   Notify physician if baby moving less than usual    Complete by:  As directed       Medication List    TAKE these medications   ACCU-CHEK FASTCLIX LANCETS Misc Inject 1 each into the skin 4 (four) times daily. New dx GDM O24.419 for testing 4 times daily   aspirin 81 MG chewable tablet Chew 81 mg by mouth daily.   glucose blood test strip Commonly known as:  ACCU-CHEK SMARTVIEW New DX GDM O24.419 for testing 4 times daily   glyBURIDE 5 MG tablet Commonly known as:  DIABETA One tablet by mouth in AM and 2 tablet at dinner   PRENATAL GUMMIES/DHA & FA PO Take 2 tablets by mouth daily.   VICKS VAPOINHALER Inha Inhale 1 puff into the lungs daily as needed (for congestion).        Signed: Jerre Vandrunen S 04/23/2016, 2:43 PM

## 2016-04-24 LAB — CULTURE, BETA STREP (GROUP B ONLY)

## 2016-04-26 ENCOUNTER — Ambulatory Visit (HOSPITAL_COMMUNITY)
Admission: RE | Admit: 2016-04-26 | Discharge: 2016-04-26 | Disposition: A | Discharge status: Home / Self Care | Payer: BLUE CROSS/BLUE SHIELD | Source: Ambulatory Visit | Attending: Obstetrics & Gynecology | Admitting: Obstetrics & Gynecology

## 2016-04-26 ENCOUNTER — Ambulatory Visit (INDEPENDENT_AMBULATORY_CARE_PROVIDER_SITE_OTHER): Payer: Medicaid Other | Admitting: Obstetrics & Gynecology

## 2016-04-26 ENCOUNTER — Other Ambulatory Visit: Payer: Self-pay | Admitting: Obstetrics & Gynecology

## 2016-04-26 VITALS — BP 130/86 | HR 84

## 2016-04-26 DIAGNOSIS — O99213 Obesity complicating pregnancy, third trimester: Secondary | ICD-10-CM

## 2016-04-26 DIAGNOSIS — O09293 Supervision of pregnancy with other poor reproductive or obstetric history, third trimester: Secondary | ICD-10-CM | POA: Insufficient documentation

## 2016-04-26 DIAGNOSIS — Z3A35 35 weeks gestation of pregnancy: Secondary | ICD-10-CM

## 2016-04-26 DIAGNOSIS — O133 Gestational [pregnancy-induced] hypertension without significant proteinuria, third trimester: Secondary | ICD-10-CM

## 2016-04-26 DIAGNOSIS — O24419 Gestational diabetes mellitus in pregnancy, unspecified control: Secondary | ICD-10-CM

## 2016-04-26 DIAGNOSIS — O24415 Gestational diabetes mellitus in pregnancy, controlled by oral hypoglycemic drugs: Secondary | ICD-10-CM | POA: Insufficient documentation

## 2016-04-26 DIAGNOSIS — O09523 Supervision of elderly multigravida, third trimester: Secondary | ICD-10-CM

## 2016-04-26 DIAGNOSIS — R030 Elevated blood-pressure reading, without diagnosis of hypertension: Secondary | ICD-10-CM | POA: Diagnosis not present

## 2016-04-26 DIAGNOSIS — O1414 Severe pre-eclampsia complicating childbirth: Secondary | ICD-10-CM | POA: Diagnosis not present

## 2016-04-26 DIAGNOSIS — O34219 Maternal care for unspecified type scar from previous cesarean delivery: Secondary | ICD-10-CM

## 2016-04-26 IMAGING — US US MFM OB LIMITED
1 series · 15 of 16 positions shown · non-contrast
Comparison: none

[Series 1: us mfm ob limited · 16 acquisitions, 15 frames shown]
[im 1/16]
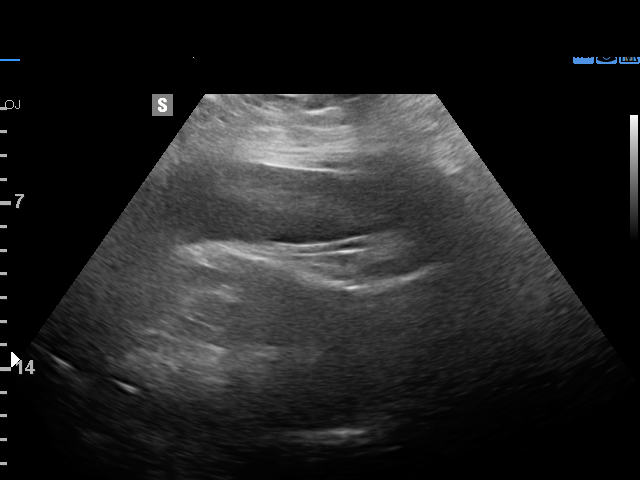
[im 2/16]
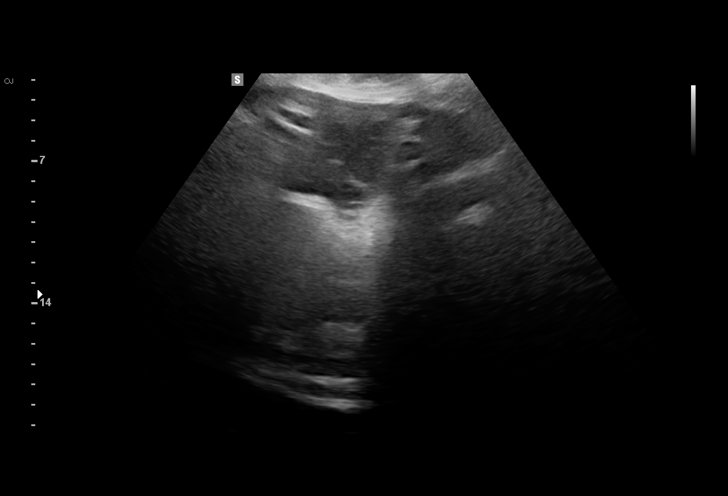
[im 3/16]
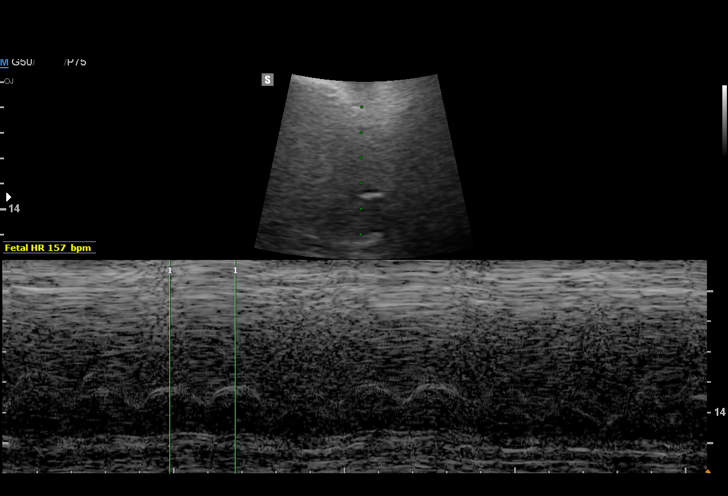
[im 4/16]
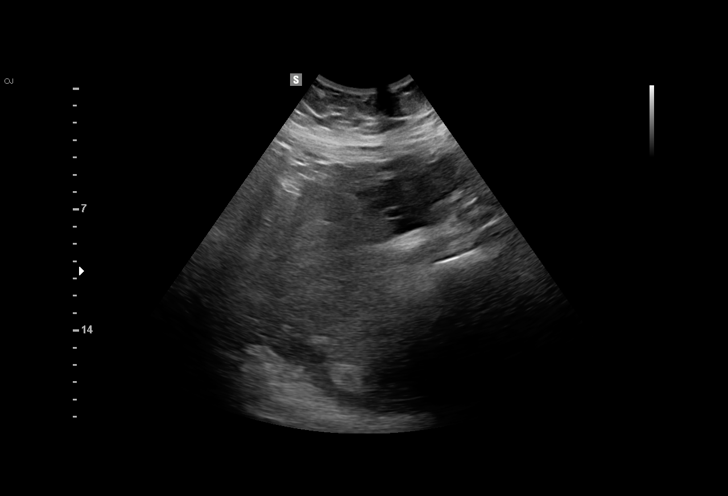
[im 5/16]
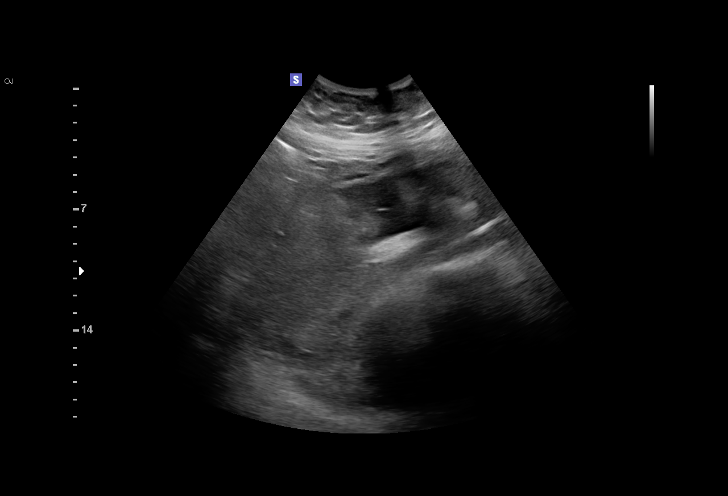
[im 6/16]
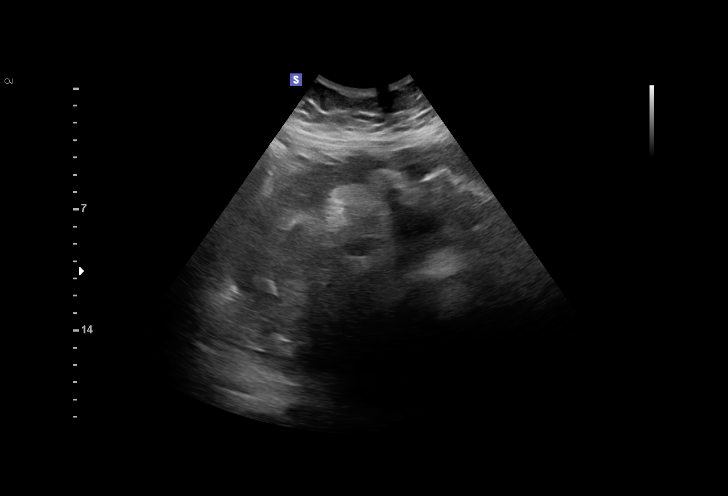
[im 7/16]
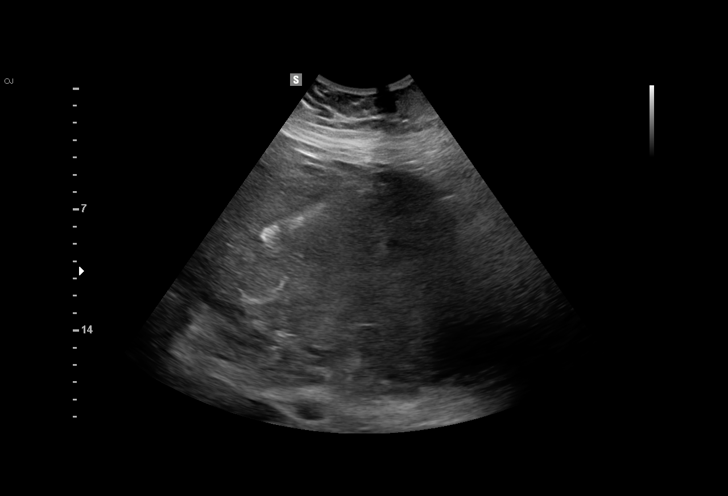
[im 9/16]
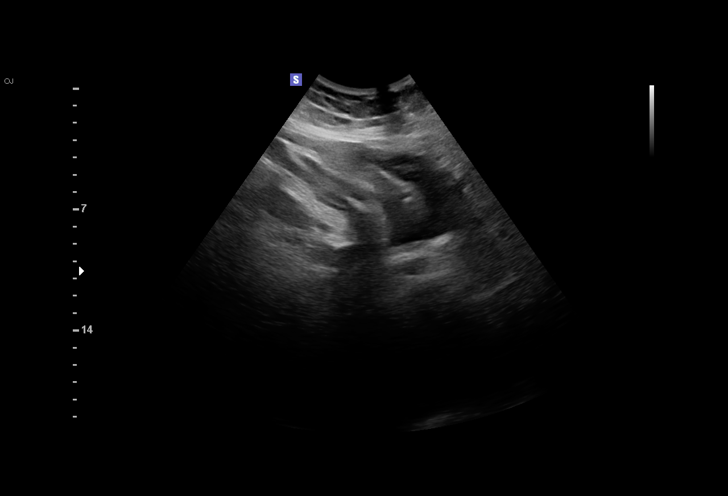
[im 10/16]
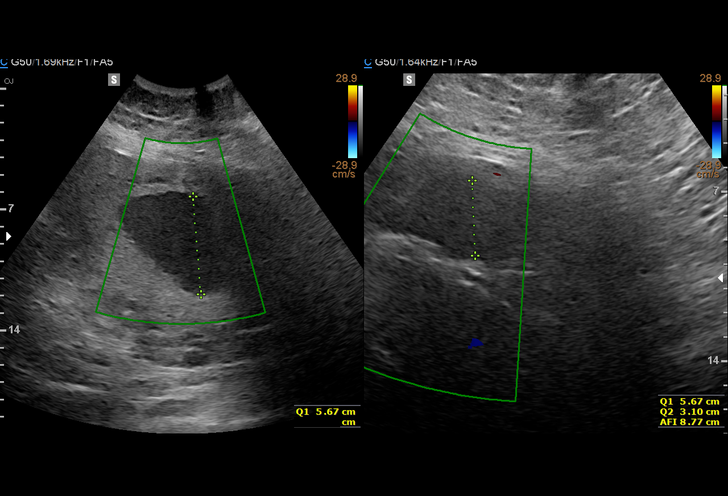
[im 11/16]
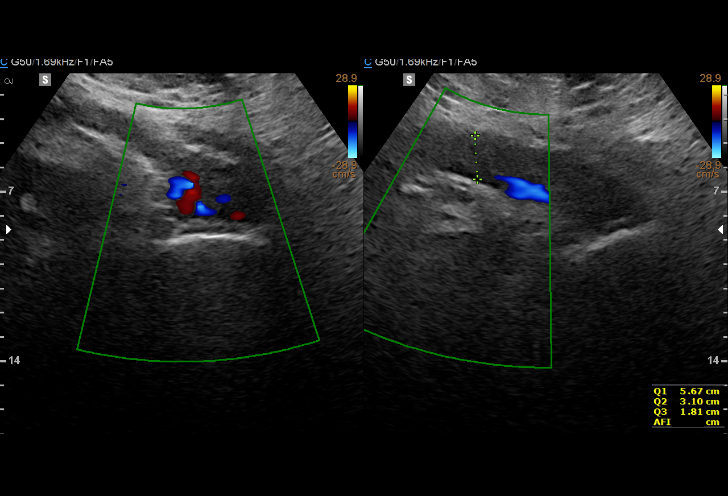
[im 12/16]
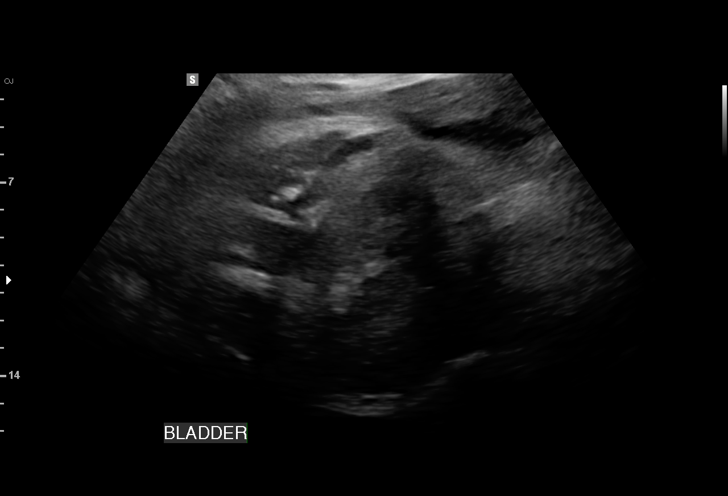
[im 13/16]
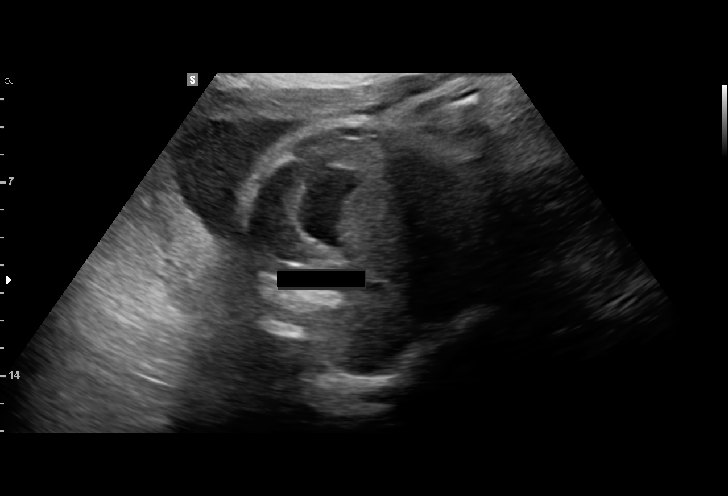
[im 14/16]
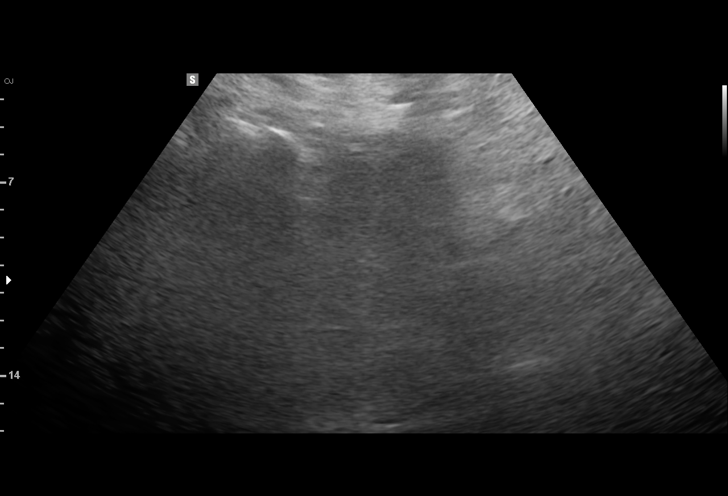
[im 15/16]
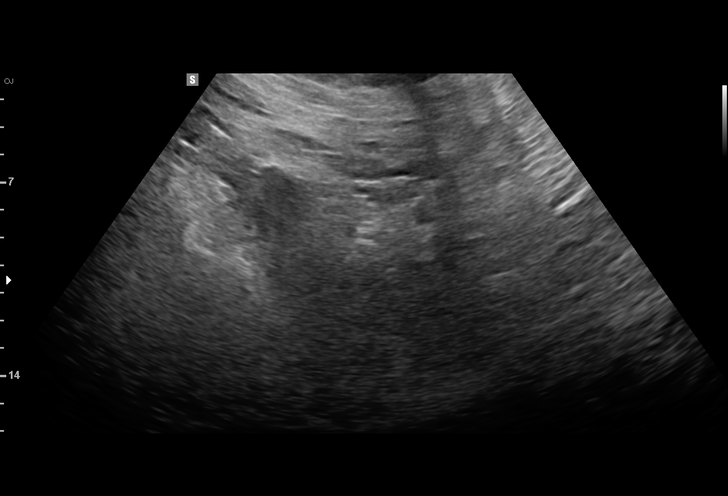
[im 16/16]
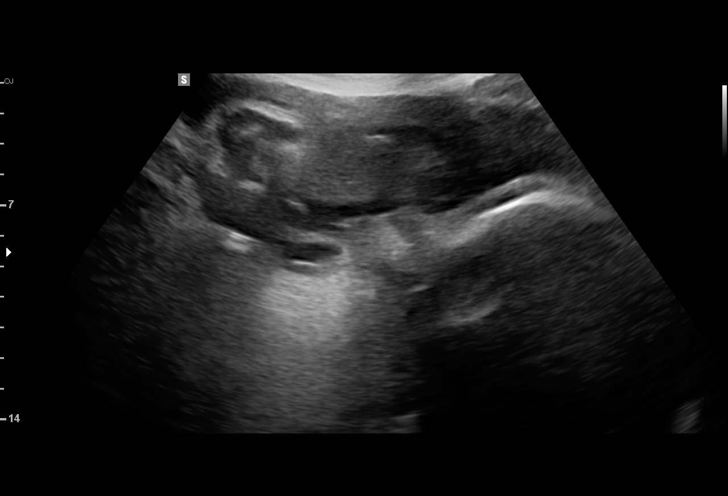

[15 of 16 positions shown; findings below may reference images not displayed]

[facility] Clinic
[REDACTED]

1  KATH RATCLIFFE                [id number]      [id number]     [id number]
Indications

35 weeks gestation of pregnancy
Gestational diabetes in pregnancy,
controlled by oral hypoglycemic drugs
(glyburide); A2/B
Poor obstetric history: Previous
preeclampsia / eclampsia/gestational HTN
Obesity complicating pregnancy, third
trimester
Previous cesarean delivery, antepartum X 2
Advanced maternal age multigravida 35+,
third trimester, declined testing
OB History

Gravidity:    5         Term:   2        Prem:   0        SAB:   1
TOP:          1       Ectopic:  0        Living: 2
Fetal Evaluation

Num Of Fetuses:     1
Fetal Heart         157
Rate(bpm):
Cardiac Activity:   Observed
Presentation:       Cephalic
Amniotic Fluid
AFI FV:      Subjectively within normal limits

AFI Sum(cm)     %Tile       Largest Pocket(cm)
10.58           26

RUQ(cm)                     LUQ(cm)        LLQ(cm)
5.67
Gestational Age

LMP:           40w 3d       Date:   07/18/15                 EDD:   04/23/16
Best:          35w 4d    Det. By:   Early Ultrasound         EDD:   05/27/16
(10/08/15)
Cervix Uterus Adnexa

Cervix
Not visualized (advanced GA >60wks)

Uterus
No abnormality visualized.
Impression

SIUP at 35+4 weeks
Cephalic presentation
Normal amniotic fluid volume

Recommendations

Continue twice weekly NSTs with weekly AFIs

## 2016-04-26 NOTE — Progress Notes (Signed)
States feels like baby moving less since got betamethasone, but still moving various times throughtout 24 hours. Encouraged patient to do kick counts.

## 2016-04-26 NOTE — Patient Instructions (Signed)
Fetal Movement Counts  Patient Name: __________________________________________________ Patient Due Date: ____________________  Performing a fetal movement count is highly recommended in high-risk pregnancies, but it is good for every pregnant woman to do. Your health care provider may ask you to start counting fetal movements at 28 weeks of the pregnancy. Fetal movements often increase:  · After eating a full meal.  · After physical activity.  · After eating or drinking something sweet or cold.  · At rest.  Pay attention to when you feel the baby is most active. This will help you notice a pattern of your baby's sleep and wake cycles and what factors contribute to an increase in fetal movement. It is important to perform a fetal movement count at the same time each day when your baby is normally most active.   HOW TO COUNT FETAL MOVEMENTS  1. Find a quiet and comfortable area to sit or lie down on your left side. Lying on your left side provides the best blood and oxygen circulation to your baby.  2. Write down the day and time on a sheet of paper or in a journal.  3. Start counting kicks, flutters, swishes, rolls, or jabs in a 2-hour period. You should feel at least 10 movements within 2 hours.  4. If you do not feel 10 movements in 2 hours, wait 2-3 hours and count again. Look for a change in the pattern or not enough counts in 2 hours.  SEEK MEDICAL CARE IF:  · You feel less than 10 counts in 2 hours, tried twice.  · There is no movement in over an hour.  · The pattern is changing or taking longer each day to reach 10 counts in 2 hours.  · You feel the baby is not moving as he or she usually does.  Date: ____________ Movements: ____________ Start time: ____________ Finish time: ____________   Date: ____________ Movements: ____________ Start time: ____________ Finish time: ____________  Date: ____________ Movements: ____________ Start time: ____________ Finish time: ____________  Date: ____________ Movements:  ____________ Start time: ____________ Finish time: ____________  Date: ____________ Movements: ____________ Start time: ____________ Finish time: ____________  Date: ____________ Movements: ____________ Start time: ____________ Finish time: ____________  Date: ____________ Movements: ____________ Start time: ____________ Finish time: ____________  Date: ____________ Movements: ____________ Start time: ____________ Finish time: ____________   Date: ____________ Movements: ____________ Start time: ____________ Finish time: ____________  Date: ____________ Movements: ____________ Start time: ____________ Finish time: ____________  Date: ____________ Movements: ____________ Start time: ____________ Finish time: ____________  Date: ____________ Movements: ____________ Start time: ____________ Finish time: ____________  Date: ____________ Movements: ____________ Start time: ____________ Finish time: ____________  Date: ____________ Movements: ____________ Start time: ____________ Finish time: ____________  Date: ____________ Movements: ____________ Start time: ____________ Finish time: ____________   Date: ____________ Movements: ____________ Start time: ____________ Finish time: ____________  Date: ____________ Movements: ____________ Start time: ____________ Finish time: ____________  Date: ____________ Movements: ____________ Start time: ____________ Finish time: ____________  Date: ____________ Movements: ____________ Start time: ____________ Finish time: ____________  Date: ____________ Movements: ____________ Start time: ____________ Finish time: ____________  Date: ____________ Movements: ____________ Start time: ____________ Finish time: ____________  Date: ____________ Movements: ____________ Start time: ____________ Finish time: ____________   Date: ____________ Movements: ____________ Start time: ____________ Finish time: ____________  Date: ____________ Movements: ____________ Start time: ____________ Finish  time: ____________  Date: ____________ Movements: ____________ Start time: ____________ Finish time: ____________  Date: ____________ Movements: ____________ Start time:   ____________ Finish time: ____________  Date: ____________ Movements: ____________ Start time: ____________ Finish time: ____________  Date: ____________ Movements: ____________ Start time: ____________ Finish time: ____________  Date: ____________ Movements: ____________ Start time: ____________ Finish time: ____________   Date: ____________ Movements: ____________ Start time: ____________ Finish time: ____________  Date: ____________ Movements: ____________ Start time: ____________ Finish time: ____________  Date: ____________ Movements: ____________ Start time: ____________ Finish time: ____________  Date: ____________ Movements: ____________ Start time: ____________ Finish time: ____________  Date: ____________ Movements: ____________ Start time: ____________ Finish time: ____________  Date: ____________ Movements: ____________ Start time: ____________ Finish time: ____________  Date: ____________ Movements: ____________ Start time: ____________ Finish time: ____________   Date: ____________ Movements: ____________ Start time: ____________ Finish time: ____________  Date: ____________ Movements: ____________ Start time: ____________ Finish time: ____________  Date: ____________ Movements: ____________ Start time: ____________ Finish time: ____________  Date: ____________ Movements: ____________ Start time: ____________ Finish time: ____________  Date: ____________ Movements: ____________ Start time: ____________ Finish time: ____________  Date: ____________ Movements: ____________ Start time: ____________ Finish time: ____________  Date: ____________ Movements: ____________ Start time: ____________ Finish time: ____________   Date: ____________ Movements: ____________ Start time: ____________ Finish time: ____________  Date: ____________  Movements: ____________ Start time: ____________ Finish time: ____________  Date: ____________ Movements: ____________ Start time: ____________ Finish time: ____________  Date: ____________ Movements: ____________ Start time: ____________ Finish time: ____________  Date: ____________ Movements: ____________ Start time: ____________ Finish time: ____________  Date: ____________ Movements: ____________ Start time: ____________ Finish time: ____________  Date: ____________ Movements: ____________ Start time: ____________ Finish time: ____________   Date: ____________ Movements: ____________ Start time: ____________ Finish time: ____________  Date: ____________ Movements: ____________ Start time: ____________ Finish time: ____________  Date: ____________ Movements: ____________ Start time: ____________ Finish time: ____________  Date: ____________ Movements: ____________ Start time: ____________ Finish time: ____________  Date: ____________ Movements: ____________ Start time: ____________ Finish time: ____________  Date: ____________ Movements: ____________ Start time: ____________ Finish time: ____________     This information is not intended to replace advice given to you by your health care provider. Make sure you discuss any questions you have with your health care provider.     Document Released: 09/29/2006 Document Revised: 09/20/2014 Document Reviewed: 06/26/2012  Elsevier Interactive Patient Education ©2016 [employer]

## 2016-04-27 ENCOUNTER — Inpatient Hospital Stay (HOSPITAL_COMMUNITY): Payer: BLUE CROSS/BLUE SHIELD | Admitting: Certified Registered Nurse Anesthetist

## 2016-04-27 ENCOUNTER — Telehealth: Payer: Self-pay

## 2016-04-27 ENCOUNTER — Inpatient Hospital Stay (HOSPITAL_COMMUNITY): Payer: BLUE CROSS/BLUE SHIELD | Admitting: Anesthesiology

## 2016-04-27 ENCOUNTER — Encounter (HOSPITAL_COMMUNITY)
Admission: AD | Disposition: A | Discharge status: Home / Self Care | Payer: Self-pay | Source: Ambulatory Visit | Attending: Family Medicine

## 2016-04-27 ENCOUNTER — Encounter (HOSPITAL_COMMUNITY): Payer: Self-pay

## 2016-04-27 ENCOUNTER — Inpatient Hospital Stay (HOSPITAL_COMMUNITY)
Admission: AD | Admit: 2016-04-27 | Discharge: 2016-04-29 | DRG: 765 | Disposition: A | Discharge status: Home / Self Care | Payer: BLUE CROSS/BLUE SHIELD | Source: Ambulatory Visit | Attending: Family Medicine | Admitting: Family Medicine

## 2016-04-27 DIAGNOSIS — Z6841 Body Mass Index (BMI) 40.0 and over, adult: Secondary | ICD-10-CM | POA: Diagnosis not present

## 2016-04-27 DIAGNOSIS — O1414 Severe pre-eclampsia complicating childbirth: Secondary | ICD-10-CM | POA: Diagnosis present

## 2016-04-27 DIAGNOSIS — Z98891 History of uterine scar from previous surgery: Secondary | ICD-10-CM

## 2016-04-27 DIAGNOSIS — Z3A35 35 weeks gestation of pregnancy: Secondary | ICD-10-CM | POA: Diagnosis not present

## 2016-04-27 DIAGNOSIS — O1490 Unspecified pre-eclampsia, unspecified trimester: Secondary | ICD-10-CM | POA: Diagnosis present

## 2016-04-27 DIAGNOSIS — R030 Elevated blood-pressure reading, without diagnosis of hypertension: Secondary | ICD-10-CM | POA: Diagnosis present

## 2016-04-27 DIAGNOSIS — Z302 Encounter for sterilization: Secondary | ICD-10-CM | POA: Diagnosis not present

## 2016-04-27 DIAGNOSIS — O99214 Obesity complicating childbirth: Secondary | ICD-10-CM | POA: Diagnosis present

## 2016-04-27 DIAGNOSIS — O34211 Maternal care for low transverse scar from previous cesarean delivery: Secondary | ICD-10-CM | POA: Diagnosis present

## 2016-04-27 DIAGNOSIS — O1413 Severe pre-eclampsia, third trimester: Secondary | ICD-10-CM

## 2016-04-27 DIAGNOSIS — O24425 Gestational diabetes mellitus in childbirth, controlled by oral hypoglycemic drugs: Secondary | ICD-10-CM | POA: Diagnosis present

## 2016-04-27 LAB — GLUCOSE, CAPILLARY
GLUCOSE-CAPILLARY: 64 mg/dL — AB (ref 65–99)
GLUCOSE-CAPILLARY: 133 mg/dL — AB (ref 65–99)
GLUCOSE-CAPILLARY: 151 mg/dL — AB (ref 65–99)
Glucose-Capillary: 95 mg/dL (ref 65–99)

## 2016-04-27 LAB — CBC
HCT: 31.8 % — ABNORMAL LOW (ref 36.0–46.0)
HEMATOCRIT: 31.6 % — AB (ref 36.0–46.0)
HEMOGLOBIN: 10.5 g/dL — AB (ref 12.0–15.0)
Hemoglobin: 10.7 g/dL — ABNORMAL LOW (ref 12.0–15.0)
MCH: 27.4 pg (ref 26.0–34.0)
MCH: 27.1 pg (ref 26.0–34.0)
MCHC: 33.6 g/dL (ref 30.0–36.0)
MCHC: 33.2 g/dL (ref 30.0–36.0)
MCV: 81.3 fL (ref 78.0–100.0)
MCV: 81.7 fL (ref 78.0–100.0)
PLATELETS: 209 10*3/uL (ref 150–400)
Platelets: 202 10*3/uL (ref 150–400)
RBC: 3.91 MIL/uL (ref 3.87–5.11)
RBC: 3.87 MIL/uL (ref 3.87–5.11)
RDW: 15.4 % (ref 11.5–15.5)
RDW: 15.2 % (ref 11.5–15.5)
WBC: 8.2 10*3/uL (ref 4.0–10.5)
WBC: 14.9 10*3/uL — ABNORMAL HIGH (ref 4.0–10.5)

## 2016-04-27 LAB — COMPREHENSIVE METABOLIC PANEL
ALT: 26 U/L (ref 14–54)
AST: 18 U/L (ref 15–41)
Albumin: 2.8 g/dL — ABNORMAL LOW (ref 3.5–5.0)
Alkaline Phosphatase: 110 U/L (ref 38–126)
Anion gap: 6 (ref 5–15)
BUN: 9 mg/dL (ref 6–20)
CHLORIDE: 108 mmol/L (ref 101–111)
CO2: 22 mmol/L (ref 22–32)
Calcium: 8.5 mg/dL — ABNORMAL LOW (ref 8.9–10.3)
Creatinine, Ser: 0.59 mg/dL (ref 0.44–1.00)
Glucose, Bld: 68 mg/dL (ref 65–99)
POTASSIUM: 3.3 mmol/L — AB (ref 3.5–5.1)
SODIUM: 136 mmol/L (ref 135–145)
Total Bilirubin: 0.7 mg/dL (ref 0.3–1.2)
Total Protein: 6.5 g/dL (ref 6.5–8.1)

## 2016-04-27 LAB — PROTEIN / CREATININE RATIO, URINE
CREATININE, URINE: 67 mg/dL
Protein Creatinine Ratio: 3.31 mg/mg{Cre} — ABNORMAL HIGH (ref 0.00–0.15)
TOTAL PROTEIN, URINE: 222 mg/dL

## 2016-04-27 LAB — TYPE AND SCREEN
ABO/RH(D): O POS
ANTIBODY SCREEN: NEGATIVE

## 2016-04-27 SURGERY — Surgical Case
Anesthesia: Epidural

## 2016-04-27 MED ORDER — BUPIVACAINE IN DEXTROSE 0.75-8.25 % IT SOLN
INTRATHECAL | Status: DC | PRN
  Administered 2016-04-27: 1.6 mL via INTRATHECAL

## 2016-04-27 MED ORDER — DEXAMETHASONE SODIUM PHOSPHATE 4 MG/ML IJ SOLN
INTRAMUSCULAR | Status: DC | PRN
  Administered 2016-04-27: 4 mg via INTRAVENOUS

## 2016-04-27 MED ORDER — MEPERIDINE HCL 25 MG/ML IJ SOLN
INTRAMUSCULAR | Status: DC | PRN
  Administered 2016-04-27: 12.5 mg via INTRAVENOUS

## 2016-04-27 MED ORDER — SCOPOLAMINE 1 MG/3DAYS TD PT72: 1.0000 | MEDICATED_PATCH | Freq: Once | TRANSDERMAL | Status: DC

## 2016-04-27 MED ORDER — NALOXONE HCL 0.4 MG/ML IJ SOLN
0.4000 mg | INTRAMUSCULAR | Status: DC | PRN
Start: 2016-04-27 — End: 2016-04-29

## 2016-04-27 MED ORDER — ONDANSETRON HCL 4 MG/2ML IJ SOLN
INTRAMUSCULAR | Status: DC | PRN
  Administered 2016-04-27: 4 mg via INTRAVENOUS

## 2016-04-27 MED ORDER — LABETALOL HCL 5 MG/ML IV SOLN
INTRAVENOUS | Status: AC
  Filled 2016-04-27: qty 4

## 2016-04-27 MED ORDER — DEXTROSE 5 % IV SOLN
3.0000 g | INTRAVENOUS | Status: AC
  Administered 2016-04-27: 3 g via INTRAVENOUS

## 2016-04-27 MED ORDER — MORPHINE SULFATE-NACL 0.5-0.9 MG/ML-% IV SOSY
PREFILLED_SYRINGE | INTRAVENOUS | Status: AC
  Filled 2016-04-27: qty 1

## 2016-04-27 MED ORDER — FENTANYL CITRATE (PF) 100 MCG/2ML IJ SOLN
INTRAMUSCULAR | Status: AC
  Filled 2016-04-27: qty 2

## 2016-04-27 MED ORDER — LABETALOL HCL 5 MG/ML IV SOLN
20.0000 mg | INTRAVENOUS | Status: DC | PRN
  Administered 2016-04-27: 20 mg via INTRAVENOUS
  Filled 2016-04-27: qty 8
  Filled 2016-04-27: qty 4

## 2016-04-27 MED ORDER — ONDANSETRON HCL 4 MG/2ML IJ SOLN: 4.0000 mg | Freq: Once | INTRAMUSCULAR | Status: DC | PRN

## 2016-04-27 MED ORDER — SCOPOLAMINE 1 MG/3DAYS TD PT72
MEDICATED_PATCH | TRANSDERMAL | Status: DC | PRN
  Administered 2016-04-27: 1 via TRANSDERMAL

## 2016-04-27 MED ORDER — DEXAMETHASONE SODIUM PHOSPHATE 4 MG/ML IJ SOLN
INTRAMUSCULAR | Status: AC
  Filled 2016-04-27: qty 1

## 2016-04-27 MED ORDER — MAGNESIUM SULFATE BOLUS VIA INFUSION
4.0000 g | Freq: Once | INTRAVENOUS | Status: AC
  Administered 2016-04-27: 2 g/h via INTRAVENOUS
  Administered 2016-04-27: 4 g via INTRAVENOUS
  Filled 2016-04-27: qty 500

## 2016-04-27 MED ORDER — HYDRALAZINE HCL 20 MG/ML IJ SOLN: 10.0000 mg | Freq: Once | INTRAMUSCULAR | Status: DC | PRN

## 2016-04-27 MED ORDER — LACTATED RINGERS IV SOLN
INTRAVENOUS | Status: DC | PRN
  Administered 2016-04-27 (×2): via INTRAVENOUS

## 2016-04-27 MED ORDER — DIPHENHYDRAMINE HCL 25 MG PO CAPS
25.0000 mg | ORAL_CAPSULE | ORAL | Status: DC | PRN
  Filled 2016-04-27: qty 1

## 2016-04-27 MED ORDER — MAGNESIUM SULFATE 50 % IJ SOLN
2.0000 g/h | INTRAVENOUS | Status: AC
  Administered 2016-04-27 – 2016-04-28 (×2): 2 g/h via INTRAVENOUS
  Filled 2016-04-27 (×2): qty 80

## 2016-04-27 MED ORDER — SCOPOLAMINE 1 MG/3DAYS TD PT72
MEDICATED_PATCH | TRANSDERMAL | Status: AC
  Filled 2016-04-27: qty 1

## 2016-04-27 MED ORDER — KETOROLAC TROMETHAMINE 30 MG/ML IJ SOLN: 30.0000 mg | Freq: Four times a day (QID) | INTRAMUSCULAR | Status: AC | PRN

## 2016-04-27 MED ORDER — LACTATED RINGERS IV SOLN
INTRAVENOUS | Status: DC | PRN
  Administered 2016-04-27 (×2): via INTRAVENOUS

## 2016-04-27 MED ORDER — PHENYLEPHRINE 8 MG IN D5W 100 ML (0.08MG/ML) PREMIX OPTIME
INJECTION | INTRAVENOUS | Status: AC
  Filled 2016-04-27: qty 100

## 2016-04-27 MED ORDER — OXYTOCIN 10 UNIT/ML IJ SOLN
INTRAMUSCULAR | Status: AC
  Filled 2016-04-27: qty 4

## 2016-04-27 MED ORDER — MEPERIDINE HCL 25 MG/ML IJ SOLN: 6.2500 mg | INTRAMUSCULAR | Status: DC | PRN

## 2016-04-27 MED ORDER — SODIUM CHLORIDE 0.9% FLUSH: 3.0000 mL | INTRAVENOUS | Status: DC | PRN

## 2016-04-27 MED ORDER — FENTANYL CITRATE (PF) 100 MCG/2ML IJ SOLN
INTRAMUSCULAR | Status: DC | PRN
  Administered 2016-04-27: 10 ug via INTRAVENOUS

## 2016-04-27 MED ORDER — MORPHINE SULFATE (PF) 0.5 MG/ML IJ SOLN
INTRAMUSCULAR | Status: DC | PRN
  Administered 2016-04-27: .2 mg via EPIDURAL

## 2016-04-27 MED ORDER — NALBUPHINE HCL 10 MG/ML IJ SOLN: 5.0000 mg | INTRAMUSCULAR | Status: DC | PRN

## 2016-04-27 MED ORDER — FENTANYL CITRATE (PF) 100 MCG/2ML IJ SOLN: 25.0000 ug | INTRAMUSCULAR | Status: DC | PRN

## 2016-04-27 MED ORDER — SOD CITRATE-CITRIC ACID 500-334 MG/5ML PO SOLN
30.0000 mL | Freq: Once | ORAL | Status: AC
  Administered 2016-04-27: 30 mL via ORAL
  Filled 2016-04-27: qty 15

## 2016-04-27 MED ORDER — MEPERIDINE HCL 25 MG/ML IJ SOLN
INTRAMUSCULAR | Status: AC
  Filled 2016-04-27: qty 1

## 2016-04-27 MED ORDER — LABETALOL HCL 5 MG/ML IV SOLN
5.0000 mg | Freq: Once | INTRAVENOUS | Status: AC
  Administered 2016-04-27: 5 mg via INTRAVENOUS

## 2016-04-27 MED ORDER — ONDANSETRON HCL 4 MG/2ML IJ SOLN
INTRAMUSCULAR | Status: AC
  Filled 2016-04-27: qty 2

## 2016-04-27 MED ORDER — FAMOTIDINE IN NACL 20-0.9 MG/50ML-% IV SOLN
20.0000 mg | Freq: Once | INTRAVENOUS | Status: AC
  Administered 2016-04-27: 20 mg via INTRAVENOUS
  Filled 2016-04-27: qty 50

## 2016-04-27 MED ORDER — LACTATED RINGERS IV SOLN
INTRAVENOUS | Status: DC | PRN
  Administered 2016-04-27 (×3): via INTRAVENOUS

## 2016-04-27 MED ORDER — NALBUPHINE HCL 10 MG/ML IJ SOLN: 5.0000 mg | Freq: Once | INTRAMUSCULAR | Status: DC | PRN

## 2016-04-27 MED ORDER — PHENYLEPHRINE 8 MG IN D5W 100 ML (0.08MG/ML) PREMIX OPTIME
INJECTION | INTRAVENOUS | Status: DC | PRN
  Administered 2016-04-27 (×2): 10 ug/min via INTRAVENOUS

## 2016-04-27 MED ORDER — OXYTOCIN 10 UNIT/ML IJ SOLN
INTRAMUSCULAR | Status: DC | PRN
  Administered 2016-04-27: 40 [IU] via INTRAVENOUS

## 2016-04-27 MED ORDER — ONDANSETRON HCL 4 MG/2ML IJ SOLN: 4.0000 mg | Freq: Three times a day (TID) | INTRAMUSCULAR | Status: DC | PRN

## 2016-04-27 MED ORDER — NALOXONE HCL 2 MG/2ML IJ SOSY
1.0000 ug/kg/h | PREFILLED_SYRINGE | INTRAVENOUS | Status: DC | PRN
  Filled 2016-04-27: qty 2

## 2016-04-27 MED ORDER — DIPHENHYDRAMINE HCL 50 MG/ML IJ SOLN
12.5000 mg | INTRAMUSCULAR | Status: DC | PRN
Start: 2016-04-27 — End: 2016-04-29

## 2016-04-27 MED ORDER — DEXTROSE 5 % IV SOLN
INTRAVENOUS | Status: AC
  Filled 2016-04-27: qty 3000

## 2016-04-27 MED ORDER — SODIUM BICARBONATE 8.4 % IV SOLN
INTRAVENOUS | Status: DC | PRN
  Administered 2016-04-27: 5 mL via EPIDURAL
  Administered 2016-04-27: 2 mL via EPIDURAL
  Administered 2016-04-27: 3 mL via EPIDURAL

## 2016-04-27 SURGICAL SUPPLY — 39 items
BENZOIN TINCTURE PRP APPL 2/3 (GAUZE/BANDAGES/DRESSINGS) ×3 IMPLANT
CATH ROBINSON RED A/P 16FR (CATHETERS) IMPLANT
CHLORAPREP W/TINT 26ML (MISCELLANEOUS) ×3 IMPLANT
CLAMP CORD UMBIL (MISCELLANEOUS) IMPLANT
CLOSURE WOUND 1/2 X4 (GAUZE/BANDAGES/DRESSINGS) ×1
CLOTH BEACON ORANGE TIMEOUT ST (SAFETY) ×3 IMPLANT
DRESSING DISP NPWT PICO 4X12 (MISCELLANEOUS) ×3 IMPLANT
DRSG OPSITE POSTOP 4X10 (GAUZE/BANDAGES/DRESSINGS) ×3 IMPLANT
ELECT REM PT RETURN 9FT ADLT (ELECTROSURGICAL) ×3
ELECTRODE REM PT RTRN 9FT ADLT (ELECTROSURGICAL) ×1 IMPLANT
EXTRACTOR VACUUM M CUP 4 TUBE (SUCTIONS) IMPLANT
EXTRACTOR VACUUM M CUP 4' TUBE (SUCTIONS)
GLOVE BIOGEL PI IND STRL 7.0 (GLOVE) ×1 IMPLANT
GLOVE BIOGEL PI IND STRL 7.5 (GLOVE) ×2 IMPLANT
GLOVE BIOGEL PI INDICATOR 7.0 (GLOVE) ×2
GLOVE BIOGEL PI INDICATOR 7.5 (GLOVE) ×4
GLOVE ECLIPSE 7.5 STRL STRAW (GLOVE) ×3 IMPLANT
GOWN STRL REUS W/TWL LRG LVL3 (GOWN DISPOSABLE) ×9 IMPLANT
KIT ABG SYR 3ML LUER SLIP (SYRINGE) IMPLANT
NEEDLE HYPO 25X5/8 SAFETYGLIDE (NEEDLE) IMPLANT
NS IRRIG 1000ML POUR BTL (IV SOLUTION) ×3 IMPLANT
PACK C SECTION WH (CUSTOM PROCEDURE TRAY) ×3 IMPLANT
PAD ABD DERMACEA PRESS 5X9 (GAUZE/BANDAGES/DRESSINGS) ×3 IMPLANT
PAD OB MATERNITY 4.3X12.25 (PERSONAL CARE ITEMS) ×3 IMPLANT
PENCIL SMOKE EVAC W/HOLSTER (ELECTROSURGICAL) ×3 IMPLANT
RTRCTR C-SECT PINK 25CM LRG (MISCELLANEOUS) ×3 IMPLANT
SPONGE LAP 18X18 X RAY DECT (DISPOSABLE) ×3 IMPLANT
STRIP CLOSURE SKIN 1/2X4 (GAUZE/BANDAGES/DRESSINGS) ×2 IMPLANT
SUT MNCRL 0 VIOLET CTX 36 (SUTURE) IMPLANT
SUT MONOCRYL 0 CTX 36 (SUTURE)
SUT PLAIN 0 NONE (SUTURE) ×3 IMPLANT
SUT VIC AB 0 CTX 36 (SUTURE) ×8
SUT VIC AB 0 CTX36XBRD ANBCTRL (SUTURE) ×4 IMPLANT
SUT VIC AB 2-0 CT1 27 (SUTURE) ×2
SUT VIC AB 2-0 CT1 TAPERPNT 27 (SUTURE) ×1 IMPLANT
SUT VIC AB 4-0 KS 27 (SUTURE) ×3 IMPLANT
SUT VICRYL 0 TIES 12 18 (SUTURE) ×3 IMPLANT
TOWEL OR 17X24 6PK STRL BLUE (TOWEL DISPOSABLE) ×3 IMPLANT
TRAY FOLEY CATH SILVER 14FR (SET/KITS/TRAYS/PACK) ×3 IMPLANT

## 2016-04-27 NOTE — Transfer of Care (Signed)
Immediate Anesthesia Transfer of Care Note  Patient: Ashley Pugh  Procedure(s) Performed: Procedure(s): CESAREAN SECTION (N/A)  Patient Location: PACU  Anesthesia Type:Spinal  Level of Consciousness: awake, alert , oriented and patient cooperative  Airway & Oxygen Therapy: Patient Spontanous Breathing  Post-op Assessment: Report given to RN and Post -op Vital signs reviewed and stable  Post vital signs: Reviewed and stable  Last Vitals:  TEMP 97.9 BP 164/108 HR 84 RR 26 POX 97   Pain level: 7 Pain goal: 4        Complications: No apparent anesthesia complications

## 2016-04-27 NOTE — MAU Note (Signed)
Patient presents with c/o of a head ache, back pain, and vaginal pressure.

## 2016-04-27 NOTE — Telephone Encounter (Signed)
Pt's niece called and informed me that the pt's BP is 171/107 and 150/107.  She stated that pt is also c/o headaches and she has done kick counts but has only felt baby move x 3 today.  I advised pt to go to MAU for evaluation.  Pt's niece stated that she would take pt to MAU.  She had no further questions.

## 2016-04-27 NOTE — Anesthesia Procedure Notes (Signed)
Epidural Patient location during procedure: OB  Staffing Anesthesiologist: Karie SchwalbeJUDD, Ashley Pugh Performed: anesthesiologist   Preanesthetic Checklist Completed: patient identified, site marked, surgical consent, pre-op evaluation, timeout performed, IV checked, risks and benefits discussed and monitors and equipment checked  Epidural Patient position: sitting Prep: ChloraPrep and site prepped and draped Patient monitoring: continuous pulse ox and blood pressure Approach: midline Location: L3-L4 Injection technique: LOR saline  Needle:  Needle type: Tuohy  Needle gauge: 17 G Needle length: 9 cm and 9 Needle insertion depth: 9 cm Catheter type: closed end flexible Catheter size: 19 Gauge Catheter at skin depth: 15 cm Test dose: negative  Assessment Events: blood not aspirated, injection not painful, no injection resistance, negative IV test and no paresthesia  Additional Notes Patient identified. Risks/Benefits/Options discussed with patient including but not limited to bleeding, infection, nerve damage, paralysis, failed block, incomplete pain control, headache, blood pressure changes, nausea, vomiting, reactions to medication both or allergic, itching and postpartum back pain. Confirmed with bedside nurse the patient's most recent platelet count. Confirmed with patient that they are not currently taking any anticoagulation, have any bleeding history or any family history of bleeding disorders. Patient expressed understanding and wished to proceed. All questions were answered. Sterile technique was used throughout the entire procedure. Please see nursing notes for vital signs. Test dose was given through epidural catheter and negative prior to continuing to dose epidural or start infusion. Warning signs of high block given to the patient including shortness of breath, tingling/numbness in hands, complete motor block, or any concerning symptoms with instructions to call for help. Patient was  given instructions on fall risk and not to get out of bed. All questions and concerns addressed with instructions to call with any issues or inadequate analgesia.    This was a CSE. Single pass easily placed LOR with clear CSE and no heme or paresthesia prior to injection.

## 2016-04-27 NOTE — Anesthesia Preprocedure Evaluation (Signed)
Anesthesia Evaluation  Patient identified by MRN, date of birth, ID band Patient awake    Reviewed: Allergy & Precautions, NPO status , Patient's Chart, lab work & pertinent test results  History of Anesthesia Complications Negative for: history of anesthetic complications  Airway Mallampati: III  TM Distance: >3 FB Neck ROM: Full    Dental no notable dental hx. (+) Dental Advisory Given   Pulmonary neg pulmonary ROS,    Pulmonary exam normal breath sounds clear to auscultation       Cardiovascular hypertension (PreE with severe features), negative cardio ROS Normal cardiovascular exam Rhythm:Regular Rate:Normal     Neuro/Psych PSYCHIATRIC DISORDERS Depression negative neurological ROS     GI/Hepatic negative GI ROS, Neg liver ROS,   Endo/Other  diabetesMorbid obesity  Renal/GU negative Renal ROS  negative genitourinary   Musculoskeletal negative musculoskeletal ROS (+)   Abdominal   Peds negative pediatric ROS (+)  Hematology negative hematology ROS (+)   Anesthesia Other Findings   Reproductive/Obstetrics (+) Pregnancy                             Anesthesia Physical Anesthesia Plan  ASA: III and emergent  Anesthesia Plan: Spinal and Epidural   Post-op Pain Management:    Induction:   Airway Management Planned:   Additional Equipment:   Intra-op Plan:   Post-operative Plan:   Informed Consent: I have reviewed the patients History and Physical, chart, labs and discussed the procedure including the risks, benefits and alternatives for the proposed anesthesia with the patient or authorized representative who has indicated his/her understanding and acceptance.   Dental advisory given  Plan Discussed with: CRNA  Anesthesia Plan Comments:         Anesthesia Quick Evaluation

## 2016-04-27 NOTE — H&P (Signed)
Ashley Pugh is a 35 y.o. female presenting for Hypertension, elevated at home in low 100s diastolically  . OB History    Gravida Para Term Preterm AB Living   5 2 1 1 2 2    SAB TAB Ectopic Multiple Live Births   1 1 0 0 2     Past Medical History:  Diagnosis Date  . Gestational diabetes   . Hx of preeclampsia, prior pregnancy, currently pregnant    Past Surgical History:  Procedure Laterality Date  . BREAST REDUCTION SURGERY    . CESAREAN SECTION     Family History: family history is not on file. Social History:  reports that she has never smoked. She has never used smokeless tobacco. She reports that she does not drink alcohol or use drugs.     Maternal Diabetes: Gestational Diabetes on Glyburide Genetic Screening: Normal Maternal Ultrasounds/Referrals: Normal Fetal Ultrasounds or other Referrals:  None Maternal Substance Abuse:  No Significant Maternal Medications:  None Significant Maternal Lab Results:  None Other Comments:  Preeclampsia with severe features  Review of Systems  Constitutional: Negative for chills, fever and malaise/fatigue.  Eyes: Negative for blurred vision, double vision and photophobia.  Respiratory: Negative for shortness of breath.   Cardiovascular: Negative for chest pain.  Gastrointestinal: Positive for abdominal pain (epigastric). Negative for constipation, diarrhea and nausea.  Genitourinary: Negative for dysuria.  Musculoskeletal: Negative for myalgias and neck pain.  Neurological: Positive for headaches. Negative for dizziness, focal weakness and weakness.   Maternal Medical History:  Reason for admission: Nausea. Hypertension worsening, headache  Contractions: Onset was less than 1 hour ago.   Frequency: irregular.   Perceived severity is mild.    Fetal activity: Perceived fetal activity is normal.   Last perceived fetal movement was within the past hour.    Prenatal complications: PIH and pre-eclampsia.   No bleeding.    Prenatal Complications - Diabetes: gestational.      Blood pressure 166/97, pulse 77, temperature 98.5 F (36.9 C), temperature source Oral, resp. rate 18, last menstrual period 07/18/2015, SpO2 98 %. Maternal Exam:  Uterine Assessment: Contraction strength is mild.  Contraction frequency is irregular.   Abdomen: Patient reports no abdominal tenderness. Introitus: not evaluated.   Ferning test: not done.  Nitrazine test: not done. Amniotic fluid character: not assessed.     Fetal Exam Fetal Monitor Review: Mode: ultrasound.   Baseline rate: 135.  Variability: moderate (6-25 bpm).   Pattern: accelerations present and no decelerations.    Fetal State Assessment: Category I - tracings are normal.     Physical Exam  Constitutional: She is oriented to person, place, and time. She appears well-developed and well-nourished. No distress.  HENT:  Head: Normocephalic.  Cardiovascular: Normal rate and regular rhythm.   Respiratory: Effort normal and breath sounds normal.  GI: Soft. She exhibits no distension. There is no tenderness. There is no rebound and no guarding.  Musculoskeletal: Normal range of motion. She exhibits edema (trace).  Neurological: She is alert and oriented to person, place, and time. She displays normal reflexes. She exhibits normal muscle tone (no clonus).  Skin: Skin is warm and dry.  Psychiatric: She has a normal mood and affect.    Prenatal labs: ABO, Rh: --/--/O POS, O POS (08/10 1621) Antibody: NEG (08/10 1621) Rubella: 10.90 (03/06 1018) RPR: NON REAC (06/19 1113)  HBsAg: NEGATIVE (03/06 1018)  HIV: NONREACTIVE (06/19 1113)  GBS:     Assessment/Plan: SIUP at 776w5d Preeclampsia with severe features  Diabetes S/P Betamethasone 5 days ago  Admit to OR per Dr Adrian BlackwaterSTinson Prepare for repeat C/S (was sched for next week)    Last ate 10 am    Southern California Stone CenterWILLIAMS,Ashley Pugh 04/27/2016, 4:24 PM

## 2016-04-27 NOTE — MAU Note (Signed)
House coverage,  nursery,BS coordinator, Data processing managerCirculator and  anes notified

## 2016-04-27 NOTE — Lactation Note (Signed)
This note was copied from a baby's chart. Lactation Consultation Note  Called to PACU to assist w/ breastfeeding.  P3, GDM, attempted breastfeeding first two children but states she was unable to latch. Mother has flat lg nipples, large breasts. History 10 years ago of breast reduction.  Incision below areola at 5 o'clock. Attempted hand expression w/ no drops at this time expressed.  Encouraged mother. Assisted w/ latching baby in laid back position on R breast.  Baby latched off and on for 15 min. Prepumped L side w/ manual pump and #30 flange to help evert nipple. Taught FOB how to assist. Baby latched for an additional 5 min and fell asleep.  Placed baby STS on mother's chest.   Patient Name: Ashley Pugh ZOXWR'UToday's Date: 04/27/2016 Reason for consult: Initial assessment   Maternal Data Formula Feeding for Exclusion: Yes Reason for exclusion: Mother's choice to formula and breast feed on admission Has patient been taught Hand Expression?: Yes Does the patient have breastfeeding experience prior to this delivery?: Yes  Feeding Feeding Type: Breast Fed Length of feed: 20 min  LATCH Score/Interventions Latch: Repeated attempts needed to sustain latch, nipple held in mouth throughout feeding, stimulation needed to elicit sucking reflex.  Audible Swallowing: A few with stimulation Intervention(s): Skin to skin  Type of Nipple: Flat Intervention(s): Hand pump  Comfort (Breast/Nipple): Soft / non-tender     Hold (Positioning): Assistance needed to correctly position infant at breast and maintain latch.  LATCH Score: 6  Lactation Tools Discussed/Used Tools: Pump   Consult Status Consult Status: Follow-up Date: 04/28/16 Follow-up type: In-patient    Dahlia ByesBerkelhammer, [facility] 04/27/2016, 9:47 PM

## 2016-04-27 NOTE — Op Note (Signed)
Ashley Pugh PROCEDURE DATE: 04/27/2016  PREOPERATIVE DIAGNOSIS: Intrauterine pregnancy at  218w5d weeks gestation; severe preeclampsia and prior cesarean section x2, undesired fertility  POSTOPERATIVE DIAGNOSIS: The same  PROCEDURE: Repeat Low Transverse Cesarean Section with Bilateral Tubal Ligation  SURGEON:  Dr. Candelaria CelesteJacob Lorrie Strauch  ASSISTANT: Webb SilversmithHeather Krietemeyer, RNFA, Dr Cleophus MoltKaty Timberlake, PGY1  INDICATIONS: Ashley Pugh is a 35 y.o. [id number] at 818w5d scheduled for cesarean section secondary to severe preeclampsia and previous section x2, undesired fertility..  The risks of cesarean section discussed with the patient included but were not limited to: bleeding which may require transfusion or reoperation; infection which may require antibiotics; injury to bowel, bladder, ureters or other surrounding organs; injury to the fetus; need for additional procedures including hysterectomy in the event of a life-threatening hemorrhage; placental abnormalities wth subsequent pregnancies, incisional problems, thromboembolic phenomenon and other postoperative/anesthesia complications. The patient concurred with the proposed plan, giving informed written consent for the procedure.    FINDINGS:  Viable female infant in vertex presentation.  Apgars 8 and 9, weight pending.  Clear amniotic fluid.  Intact placenta, three vessel cord.  Normal uterus, fallopian tubes and ovaries bilaterally.  ANESTHESIA:    Spinal INTRAVENOUS FLUIDS:600 ml ESTIMATED BLOOD LOSS: 700 ml URINE OUTPUT:  150 ml SPECIMENS: Placenta sent to pathology COMPLICATIONS: None immediate  PROCEDURE IN DETAIL:  The patient received intravenous antibiotics and had sequential compression devices applied to her lower extremities while in the preoperative area.  She was then taken to the operating room where spinal anesthesia was administered and epidural anesthesia was placed and was found to be adequate. She was then placed in a dorsal supine  position with a leftward tilt, and prepped and draped in a sterile manner.  A foley catheter was placed into her bladder and attached to constant gravity, which drained clear fluid throughout.  After an adequate timeout was performed, a Pfannenstiel skin incision was made with scalpel and carried through to the underlying layer of fascia. The fascia was incised in the midline and this incision was extended bilaterally using the Mayo scissors. Kocher clamps were applied to the inferior aspect of the fascial incision and the underlying rectus muscles were dissected off bluntly. The Kocher clamps were applied to the superior aspect of the fascial incision.  The fascia was densely adhered to the rectus muscles.  The underlying rectus muscles were dissected off with bovie.  Unfortunately, due to the dense adhesions, only a small amount of the fasica was dissected off.  A small defect in the rectus muscles was present and the peritoneum was entered bluntly.  The rectus muscles were transected in the midline about 3 cm on either side with bovie.  The bladder was found to be densely adhered to the lower uterine segment.  A bladder blade was placed to hold the bladder down.  Attention was turned to the superior part of the lower uterine segment where a transverse hysterotomy was made with a scalpel and extended bilaterally bluntly. The infant was successfully delivered, and cord was clamped and cut and infant was handed over to awaiting neonatology team. Uterine massage was then administered and the placenta delivered intact with three-vessel cord. The uterus was then cleared of clot and debris.  The hysterotomy was closed with 0 Vicryl in a running locked fashion, and a second layer was also placed with a 0 Vicryl. Figure of eight stitches were placed were placed on the left corner for hemostasis.  Overall, excellent hemostasis was noted. The abdomen and  the pelvis were cleared of all clot and debris. The patient's left  fallopian tube was then identified, brought to the incision, and grasped with a Babcock clamp. The tube was then followed out to the fimbria. The Babcock clamp was then used to grasp the tube approximately 4 cm from the cornual region. A 3 cm segment of the tube was then ligated with free tie of plain gut suture, transected and excised. Good hemostasis was noted and the tube was returned to the abdomen.  Hemostasis was confirmed on all surfaces.  The fascia was then closed using 0 Vicryl in a running fashion.  The subcutaneous layer was reapproximated with plain gut and the skin was closed with 4-0 vicryl. The patient tolerated the procedure well. Sponge, lap, instrument and needle counts were correct x 2. She was taken to the recovery room in stable condition.   Levie HeritageJacob J Tigerlily Christine, DO 04/27/2016 8:46 PM

## 2016-04-27 NOTE — Anesthesia Postprocedure Evaluation (Signed)
Anesthesia Post Note  Patient: Ashley NeighborsFarah Mantei  Procedure(s) Performed: Procedure(s) (LRB): CESAREAN SECTION (N/A)  Patient location during evaluation: PACU Anesthesia Type: Spinal Level of consciousness: awake and alert Pain management: pain level controlled Vital Signs Assessment: post-procedure vital signs reviewed and stable Respiratory status: spontaneous breathing, nonlabored ventilation, respiratory function stable and patient connected to nasal cannula oxygen Cardiovascular status: blood pressure returned to baseline and stable Postop Assessment: no signs of nausea or vomiting, spinal receding and patient able to bend at knees Anesthetic complications: no     Last Vitals:  Vitals:   04/27/16 2130 04/27/16 2215  BP: (!) 188/118   Pulse: 75   Resp: (!) 24   Temp: 36.6 C 36.4 C    Last Pain:  Vitals:   04/27/16 2215  TempSrc: Oral  PainSc:    Pain Goal:                 [name]

## 2016-04-28 ENCOUNTER — Encounter (HOSPITAL_COMMUNITY): Payer: Self-pay | Admitting: Family Medicine

## 2016-04-28 DIAGNOSIS — Z98891 History of uterine scar from previous surgery: Secondary | ICD-10-CM

## 2016-04-28 LAB — CBC
HEMATOCRIT: 25.3 % — AB (ref 36.0–46.0)
HEMOGLOBIN: 8.5 g/dL — AB (ref 12.0–15.0)
MCH: 27.7 pg (ref 26.0–34.0)
MCHC: 33.6 g/dL (ref 30.0–36.0)
MCV: 82.4 fL (ref 78.0–100.0)
Platelets: 220 10*3/uL (ref 150–400)
RBC: 3.07 MIL/uL — ABNORMAL LOW (ref 3.87–5.11)
RDW: 15.5 % (ref 11.5–15.5)
WBC: 15.9 10*3/uL — AB (ref 4.0–10.5)

## 2016-04-28 LAB — RPR: RPR: NONREACTIVE

## 2016-04-28 LAB — GLUCOSE, CAPILLARY: GLUCOSE-CAPILLARY: 234 mg/dL — AB (ref 65–99)

## 2016-04-28 MED ORDER — HYDROCHLOROTHIAZIDE 25 MG PO TABS: 25.0000 mg | ORAL_TABLET | Freq: Every day | ORAL | Status: DC

## 2016-04-28 MED ORDER — WITCH HAZEL-GLYCERIN EX PADS: 1.0000 "application " | MEDICATED_PAD | CUTANEOUS | Status: DC | PRN

## 2016-04-28 MED ORDER — SIMETHICONE 80 MG PO CHEW
80.0000 mg | CHEWABLE_TABLET | Freq: Three times a day (TID) | ORAL | Status: DC
  Administered 2016-04-28: 80 mg via ORAL
  Filled 2016-04-28 (×2): qty 1

## 2016-04-28 MED ORDER — SENNOSIDES-DOCUSATE SODIUM 8.6-50 MG PO TABS
2.0000 | ORAL_TABLET | ORAL | Status: DC
  Administered 2016-04-28 – 2016-04-29 (×2): 2 via ORAL
  Filled 2016-04-28 (×2): qty 2

## 2016-04-28 MED ORDER — DIPHENHYDRAMINE HCL 25 MG PO CAPS: 25.0000 mg | ORAL_CAPSULE | Freq: Four times a day (QID) | ORAL | Status: DC | PRN

## 2016-04-28 MED ORDER — SIMETHICONE 80 MG PO CHEW: 80.0000 mg | CHEWABLE_TABLET | ORAL | Status: DC | PRN

## 2016-04-28 MED ORDER — PRENATAL MULTIVITAMIN CH
1.0000 | ORAL_TABLET | Freq: Every day | ORAL | Status: DC
  Administered 2016-04-28 – 2016-04-29 (×2): 1 via ORAL
  Filled 2016-04-28 (×2): qty 1

## 2016-04-28 MED ORDER — LABETALOL HCL 5 MG/ML IV SOLN: 5.0000 mg | INTRAVENOUS | Status: DC | PRN

## 2016-04-28 MED ORDER — LACTATED RINGERS IV SOLN
INTRAVENOUS | Status: DC
  Administered 2016-04-28 (×2): via INTRAVENOUS

## 2016-04-28 MED ORDER — TETANUS-DIPHTH-ACELL PERTUSSIS 5-2.5-18.5 LF-MCG/0.5 IM SUSP: 0.5000 mL | Freq: Once | INTRAMUSCULAR | Status: DC

## 2016-04-28 MED ORDER — DIBUCAINE 1 % RE OINT: 1.0000 "application " | TOPICAL_OINTMENT | RECTAL | Status: DC | PRN

## 2016-04-28 MED ORDER — ACETAMINOPHEN 325 MG PO TABS: 650.0000 mg | ORAL_TABLET | ORAL | Status: DC | PRN

## 2016-04-28 MED ORDER — IBUPROFEN 600 MG PO TABS
600.0000 mg | ORAL_TABLET | Freq: Four times a day (QID) | ORAL | Status: DC
  Administered 2016-04-28 – 2016-04-29 (×6): 600 mg via ORAL
  Filled 2016-04-28 (×6): qty 1

## 2016-04-28 MED ORDER — OXYCODONE-ACETAMINOPHEN 5-325 MG PO TABS
1.0000 | ORAL_TABLET | ORAL | Status: DC | PRN
  Administered 2016-04-29: 1 via ORAL
  Filled 2016-04-28: qty 1

## 2016-04-28 MED ORDER — OXYTOCIN 40 UNITS IN LACTATED RINGERS INFUSION - SIMPLE MED
2.5000 [IU]/h | INTRAVENOUS | Status: AC
  Administered 2016-04-28: 2.5 [IU]/h via INTRAVENOUS

## 2016-04-28 MED ORDER — ENALAPRIL MALEATE 5 MG PO TABS
5.0000 mg | ORAL_TABLET | Freq: Every day | ORAL | Status: DC
  Administered 2016-04-28 – 2016-04-29 (×2): 5 mg via ORAL
  Filled 2016-04-28 (×3): qty 1

## 2016-04-28 MED ORDER — MENTHOL 3 MG MT LOZG: 1.0000 | LOZENGE | OROMUCOSAL | Status: DC | PRN

## 2016-04-28 MED ORDER — COCONUT OIL OIL
1.0000 "application " | TOPICAL_OIL | Status: DC | PRN
  Administered 2016-04-29: 1 via TOPICAL
  Filled 2016-04-28: qty 120

## 2016-04-28 MED ORDER — SIMETHICONE 80 MG PO CHEW
80.0000 mg | CHEWABLE_TABLET | ORAL | Status: DC
  Administered 2016-04-28 – 2016-04-29 (×2): 80 mg via ORAL
  Filled 2016-04-28 (×2): qty 1

## 2016-04-28 MED ORDER — OXYCODONE-ACETAMINOPHEN 5-325 MG PO TABS: 2.0000 | ORAL_TABLET | ORAL | Status: DC | PRN

## 2016-04-28 MED ORDER — ZOLPIDEM TARTRATE 5 MG PO TABS: 5.0000 mg | ORAL_TABLET | Freq: Every evening | ORAL | Status: DC | PRN

## 2016-04-28 NOTE — Lactation Note (Signed)
This note was copied from a baby's chart. Lactation Consultation Note  Patient Name: Ashley Pugh NBVAP'O Date: 04/28/2016 Reason for consult: Follow-up assessment;Late preterm infant;Infant < 6lbs  LPI 15 hours old. Mom reports that the baby has been sleepy at the breast. Mom states that she has been pumping, but isn't seeing any colostrum yet. Mom reports being engorged with last baby--born after mom's breast reduction. Assisted mom to latch baby at right breast in football position. Undressed baby, and assisted mom to use hand pump to evert her nipple, but baby still not wanting to latch. Assisted mom with hand expression, but no colostrum at breast. Baby still sleepy at breast. Mom reported that she had fed baby 25 ml of formula earlier. Enc mom to keep baby STS now. Mom given LPI guidelines with review.  Enc mom to put baby to breast with cues and at least every 3 hours. Enc mom to supplement with EBM/formula based on supplementation guidelines, which were given with review. Enc mom to post-pump after each feeding, followed by hand expression. Mom given #30 flanges for her pumping kit--mom already had #30 flange on her manual pump. Mom gave permission to send BF referral to [facility] and it was faxed over. Enc mom to call for assistance with latching/pumping as needed.   Maternal Data Has patient been taught Hand Expression?: Yes Does the patient have breastfeeding experience prior to this delivery?: Yes  Feeding Feeding Type: Breast Fed Length of feed: 0 min  LATCH Score/Interventions Latch: Too sleepy or reluctant, no latch achieved, no sucking elicited. Intervention(s): Skin to skin;Waking techniques Intervention(s): Adjust position;Assist with latch;Breast compression  Audible Swallowing: None Intervention(s): Skin to skin Intervention(s): Hand expression  Type of Nipple: Flat Intervention(s): Hand pump;Double electric pump  Comfort (Breast/Nipple): Soft / non-tender      Hold (Positioning): Assistance needed to correctly position infant at breast and maintain latch. Intervention(s): Breastfeeding basics reviewed;Support Pillows;Position options;Skin to skin  LATCH Score: 4  Lactation Tools Discussed/Used WIC Program: Yes Pump Review: Setup, frequency, and cleaning;Milk Storage Initiated by:: bedside nurse Date initiated:: 04/27/16   Consult Status Consult Status: Follow-up Date: 04/29/16 Follow-up type: In-patient    Andres Labrum 04/28/2016, 11:32 AM

## 2016-04-28 NOTE — Progress Notes (Signed)
Pt was warned about infant sleepng in bed with Mom

## 2016-04-28 NOTE — Progress Notes (Signed)
Subjective: Postpartum Day 1: Cesarean Delivery Patient reports incisional pain, tolerating PO, + flatus and no problems voiding.    Objective: Vital signs in last 24 hours: Temp:  [97.5 F (36.4 C)-98.5 F (36.9 C)] 98 F (36.7 C) (08/16 0600) Pulse Rate:  [66-97] 80 (08/16 0600) Resp:  [14-28] 18 (08/16 0600) BP: (96-188)/(60-118) 129/85 (08/16 0600) SpO2:  [93 %-100 %] 100 % (08/16 0600) Weight:  [280 lb 0.1 oz (127 kg)-291 lb (132 kg)] 280 lb 0.1 oz (127 kg) (08/16 0540)  Physical Exam:  General: alert, cooperative and no distress  Heart: regular rate, no murmur Lungs: clear to auscultation bilaterally, no wheezing.  Lochia: appropriate Uterine Fundus: firm Incision: healing well, no significant drainage DVT Evaluation: No evidence of DVT seen on physical exam.  SCDs on   Recent Labs  04/27/16 2203 04/28/16 0603  HGB 10.5* 8.5*  HCT 31.6* 25.3*    Assessment/Plan: Status post Cesarean section. Doing well postoperatively.  Continue current care. Continue magnesium x24 hrs.  Tram Wrenn JEHIEL 04/28/2016, 7:07 AM

## 2016-04-28 NOTE — Addendum Note (Signed)
Addendum  created 04/28/16 0758 by Shanon PayorSuzanne M Zykiria Bruening, CRNA   Sign clinical note

## 2016-04-28 NOTE — Anesthesia Postprocedure Evaluation (Signed)
Anesthesia Post Note  Patient: Ashley Pugh  Procedure(s) Performed: Procedure(s) (LRB): CESAREAN SECTION (N/A)  Patient location during evaluation: Mother Baby Anesthesia Type: Spinal Level of consciousness: awake and alert and oriented Pain management: satisfactory to patient Vital Signs Assessment: post-procedure vital signs reviewed and stable Respiratory status: spontaneous breathing and nonlabored ventilation Cardiovascular status: stable Postop Assessment: no headache, no backache, patient able to bend at knees, no signs of nausea or vomiting and adequate PO intake Anesthetic complications: no     Last Vitals:  Vitals:   04/28/16 0600 04/28/16 0700  BP: 129/85 136/80  Pulse: 80 90  Resp: 18 20  Temp: 36.7 C     Last Pain:  Vitals:   04/28/16 0600  TempSrc: Oral  PainSc:    Pain Goal: Patients Stated Pain Goal: 3 (04/28/16 0400)               Madison HickmanGREGORY,[name]

## 2016-04-29 ENCOUNTER — Other Ambulatory Visit: Payer: BLUE CROSS/BLUE SHIELD | Admitting: Family Medicine

## 2016-04-29 LAB — GLUCOSE, CAPILLARY: Glucose-Capillary: 165 mg/dL — ABNORMAL HIGH (ref 65–99)

## 2016-04-29 MED ORDER — IBUPROFEN 600 MG PO TABS: 600.0000 mg | ORAL_TABLET | Freq: Four times a day (QID) | ORAL | 0 refills | Status: DC

## 2016-04-29 MED ORDER — METFORMIN HCL 500 MG PO TABS
500.0000 mg | ORAL_TABLET | Freq: Two times a day (BID) | ORAL | Status: DC
  Administered 2016-04-29: 500 mg via ORAL
  Filled 2016-04-29 (×3): qty 1

## 2016-04-29 MED ORDER — OXYCODONE-ACETAMINOPHEN 5-325 MG PO TABS: 1.0000 | ORAL_TABLET | ORAL | 0 refills | Status: DC | PRN

## 2016-04-29 MED ORDER — ENALAPRIL MALEATE 5 MG PO TABS: 5.0000 mg | ORAL_TABLET | Freq: Every day | ORAL | 1 refills | Status: DC

## 2016-04-29 MED ORDER — METFORMIN HCL 500 MG PO TABS: 500.0000 mg | ORAL_TABLET | Freq: Two times a day (BID) | ORAL | 1 refills | Status: DC

## 2016-04-29 NOTE — Progress Notes (Addendum)
This RN talked with patient on the phone regarding going home on metformin (this is a new medication for patient).  This RN discussed home medication instructions, that metformin does not usually cause low blood glucose levels (as this medication makes body more sensitive to insulin), and common side effects of metformin including nausea and diarrhea (which often improves after 2 weeks).  Also, discussed asking PCP the benefits of switching to extended release if diarrhea continues to be a problem.  Discussed target CBG's of 80 to 130, randomly checking blood glucose at different times during the day to get a bigger picture, and making sure that the machine is calibrated to correct time and date.  As [place] has just moved to [place], this RN also discussed the importance of establishing care with a Primary Care Physician to manage HTN and blood glucose levels long term.  Discussed breast feeding support group to help with breast feeding problems (currently having difficulty with latching baby to breast) and outpatient lactation services available through our hospital.  Discussed Baby and Me support group with patient to help with postpartum weight loss, exercise, and how to manage baby and a healthy lifestyle.  Patient voiced understanding of information discussed.

## 2016-04-29 NOTE — Discharge Summary (Signed)
OB Discharge Summary     Patient Name: Ashley Pugh DOB: 02/18/1981 MRN: [id number]  Date of admission: 04/27/2016 Delivering MD: Levie HeritageSTINSON, JACOB J   Date of discharge: 04/29/2016  Admitting diagnosis: 35w hbp, headache, pressure Intrauterine pregnancy: 530w5d     Secondary diagnosis:  Active Problems:   Preeclampsia   Status post cesarean section   Cesarean delivery delivered BLT  Additional problems: None     Discharge diagnosis: Preterm Pregnancy Delivered, Preeclampsia (severe) and GDM A2  W/ continued significant hyperglycemia.                                                                                               Post partum procedures:None  Augmentation: None  Complications: None  Hospital course:  Sceduled C/S   35 y.o. yo [id number] at 330w5d was admitted to the hospital 04/27/2016 for repeat cesarean section with the following indication:Elective Repeat and Preecalmpsia w/ severe features.  Membrane Rupture Time/Date: 7:33 PM ,04/27/2016   Patient delivered a Viable infant.04/27/2016  Details of operation can be found in separate operative note.  Pateint had an uncomplicated postpartum course.  She is ambulating, tolerating a regular diet, passing flatus, and urinating well. Patient is discharged home in stable condition on  04/29/16          Physical exam Vitals:   04/29/16 0120 04/29/16 0515 04/29/16 0530 04/29/16 0938  BP: 136/79  132/79 129/72  Pulse: (!) 118  94 92  Resp: 18  20 18   Temp: 98.2 F (36.8 C)  98 F (36.7 C) 98.8 F (37.1 C)  TempSrc: Oral  Oral Oral  SpO2: 100%  100% 100%  Weight:  281 lb 6 oz (127.6 kg)    Height:       General: alert, cooperative and no distress Lochia: appropriate Uterine Fundus: firm Incision: Dressing is intact, scant old blood visible, PICO wound vac in place DVT Evaluation: Negative Homan's sign. Labs: Lab Results  Component Value Date   WBC 15.9 (H) 04/28/2016   HGB 8.5 (L) 04/28/2016   HCT 25.3 (L)  04/28/2016   MCV 82.4 04/28/2016   PLT 220 04/28/2016   CMP Latest Ref Rng & Units 04/27/2016  Glucose 65 - 99 mg/dL 68  BUN 6 - 20 mg/dL 9  Creatinine 1.470.44 - 8.291.00 mg/dL 5.620.59  Sodium 130135 - 865145 mmol/L 136  Potassium 3.5 - 5.1 mmol/L 3.3(L)  Chloride 101 - 111 mmol/L 108  CO2 22 - 32 mmol/L 22  Calcium 8.9 - 10.3 mg/dL 7.8(I8.5(L)  Total Protein 6.5 - 8.1 g/dL 6.5  Total Bilirubin 0.3 - 1.2 mg/dL 0.7  Alkaline Phos 38 - 126 U/L 110  AST 15 - 41 U/L 18  ALT 14 - 54 U/L 26    Discharge instruction: per After Visit Summary and "Baby and Me Booklet".  After visit meds:    Medication List    STOP taking these medications   aspirin 81 MG chewable tablet   glyBURIDE 5 MG tablet Commonly known as:  DIABETA     TAKE these medications   ACCU-CHEK FASTCLIX LANCETS Misc Inject 1  each into the skin 4 (four) times daily. New dx GDM O24.419 for testing 4 times daily   enalapril 5 MG tablet Commonly known as:  VASOTEC Take 1 tablet (5 mg total) by mouth daily.   glucose blood test strip Commonly known as:  ACCU-CHEK SMARTVIEW New DX GDM O24.419 for testing 4 times daily   ibuprofen 600 MG tablet Commonly known as:  ADVIL,MOTRIN Take 1 tablet (600 mg total) by mouth every 6 (six) hours.   metFORMIN 500 MG tablet Commonly known as:  GLUCOPHAGE Take 1 tablet (500 mg total) by mouth 2 (two) times daily with a meal.   PRENATAL GUMMIES/DHA & FA PO Take 2 tablets by mouth daily.   VICKS VAPOINHALER Inha Inhale 1 puff into the lungs daily as needed (for congestion).       Diet: carb modified diet  Activity: Advance as tolerated. Pelvic rest for 6 weeks.   Outpatient follow up:1 week incision check and PICO removal. 6 week PP appt Follow up Appt:Future Appointments Date Time Provider Department Center  05/04/2016 11:00 AM [facility] US 3 [facility]   Follow-up Information    [facility]   Specialty:  Obstetrics and  Gynecology Why:  Wound check and blood pressure check Contact information: [address] [place] [id number] [phone]       [facility] .   Why:  as needed in emergencies Contact information: [address] [id number] [place] [id number] [phone]          Postpartum contraception: Tubal Ligation  Newborn Data: Live born female  Birth Weight: 5 lb 8.9 oz (2520 g) APGAR: 8, 9  Baby Feeding: Breast Disposition:home with mother   04/29/2016 Dorathy KinsmanVirginia Mylz Yuan, CNM

## 2016-04-29 NOTE — Lactation Note (Signed)
This note was copied from a baby's chart. Lactation Consultation Note  Patient Name: Girl Wende NeighborsFarah Caltagirone NFAOZ'HToday's Date: 04/29/2016 Reason for consult: Follow-up assessment   With this mom of a LPI, now 8041 hours old. The baby is now taking up to 22 l's at a feeding, and mom advised to increase amount to 30 ml's later today, and let baby take as much as she wants from this, and then tomorrow, gradualy increase amounts as tolerated. I gave  Mom a can of Neosure 22 cal formula, and reviewed how to prepare  formula, and gave mom a bottle marked to 60 ml's for this. [facility]. I decreased her to 27 flanges, and gave her coconut oil, and advised mom to use her hand pump [facility]. Mom knows to call for questions/concerns.   Maternal Data    Feeding    LATCH Score/Interventions       Type of Nipple: Inverted              Lactation Tools Discussed/Used WIC Program: Yes (fax sent for DEP)   Consult Status Consult Status: Complete Follow-up type: Call as needed    Alfred LevinsLee, Halsey Persaud Anne 04/29/2016, 1:06 PM

## 2016-04-29 NOTE — Discharge Instructions (Signed)
Cesarean Delivery, Care After °Refer to this sheet in the next few weeks. These instructions provide you with information on caring for yourself after your procedure. Your health care provider may also give you specific instructions. Your treatment has been planned according to current medical practices, but problems sometimes occur. Call your health care provider if you have any problems or questions after you go home. °HOME CARE INSTRUCTIONS  °· Only take over-the-counter or prescription medications as directed by your health care provider. °· Do not drink alcohol, especially if you are breastfeeding or taking medication to relieve pain. °· Do not chew or smoke tobacco. °· Continue to use good perineal care. Good perineal care includes: °· Wiping your perineum from front to back. °· Keeping your perineum clean. °· Check your surgical cut (incision) daily for increased redness, drainage, swelling, or separation of skin. °· Clean your incision gently with soap and water every day, and then pat it dry. If your health care provider says it is okay, leave the incision uncovered. Use a bandage (dressing) if the incision is draining fluid or appears irritated. If the adhesive strips across the incision do not fall off within 7 days, carefully peel them off. °· Hug a pillow when coughing or sneezing until your incision is healed. This helps to relieve pain. °· Do not use tampons or douche until your health care provider says it is okay. °· Shower, wash your hair, and take tub baths as directed by your health care provider. °· Wear a well-fitting bra that provides breast support. °· Limit wearing support panties or control-top hose. °· Drink enough fluids to keep your urine clear or pale yellow. °· Eat high-fiber foods such as whole grain cereals and breads, brown rice, beans, and fresh fruits and vegetables every day. These foods may help prevent or relieve constipation. °· Resume activities such as climbing stairs,  driving, lifting, exercising, or traveling as directed by your health care provider. °· Talk to your health care provider about resuming sexual activities. This is dependent upon your risk of infection, your rate of healing, and your comfort and desire to resume sexual activity. °· Try to have someone help you with your household activities and your newborn for at least a few days after you leave the hospital. °· Rest as much as possible. Try to rest or take a nap when your newborn is sleeping. °· Increase your activities gradually. °· Keep all of your scheduled postpartum appointments. It is very important to keep your scheduled follow-up appointments. At these appointments, your health care provider will be checking to make sure that you are healing physically and emotionally. °SEEK MEDICAL CARE IF:  °· You are passing large clots from your vagina. Save any clots to show your health care provider. °· You have a foul smelling discharge from your vagina. °· You have trouble urinating. °· You are urinating frequently. °· You have pain when you urinate. °· You have a change in your bowel movements. °· You have increasing redness, pain, or swelling near your incision. °· You have pus draining from your incision. °· Your incision is separating. °· You have painful, hard, or reddened breasts. °· You have a severe headache. °· You have blurred vision or see spots. °· You feel sad or depressed. °· You have thoughts of hurting yourself or your newborn. °· You have questions about your care, the care of your newborn, or medications. °· You are dizzy or light-headed. °· You have a rash. °· You   have pain, redness, or swelling at the site of the removed intravenous access (IV) tube. °· You have nausea or vomiting. °· You stopped breastfeeding and have not had a menstrual period within 12 weeks of stopping. °· You are not breastfeeding and have not had a menstrual period within 12 weeks of delivery. °· You have a fever. °SEEK  IMMEDIATE MEDICAL CARE IF: °· You have persistent pain. °· You have chest pain. °· You have shortness of breath. °· You faint. °· You have leg pain. °· You have stomach pain. °· Your vaginal bleeding saturates 2 or more sanitary pads in 1 hour. °MAKE SURE YOU:  °· Understand these instructions. °· Will watch your condition. °· Will get help right away if you are not doing well or get worse. °  °This information is not intended to replace advice given to you by your health care provider. Make sure you discuss any questions you have with your health care provider. °  °Document Released: 05/22/2002 Document Revised: 09/20/2014 Document Reviewed: 04/26/2012 °Elsevier Interactive Patient Education ©2016 Elsevier Inc[employer] ° ° °Hypertension During Pregnancy °Hypertension, or high blood pressure, is when there is extra pressure inside your blood vessels that carry blood from the heart to the rest of your body (arteries). It can happen at any time in life, including pregnancy. Hypertension during pregnancy can cause problems for you and your baby. Your baby might not weigh as much as he or she should at birth or might be born early (premature). Very bad cases of hypertension during pregnancy can be life-threatening.  °Different types of hypertension can occur during pregnancy. These include: °· Chronic hypertension. This happens when a woman has hypertension before pregnancy and it continues during pregnancy. °· Gestational hypertension. This is when hypertension develops during pregnancy. °· Preeclampsia or toxemia of pregnancy. This is a very serious type of hypertension that develops only during pregnancy. It affects the whole body and can be very dangerous for both mother and baby.   °Gestational hypertension and preeclampsia usually go away after your baby is born. Your blood pressure will likely stabilize within 6 weeks. Women who have hypertension during pregnancy have a greater chance of developing hypertension later in  life or with future pregnancies. °RISK FACTORS °There are certain factors that make it more likely for you to develop hypertension during pregnancy. These include: °· Having hypertension before pregnancy. °· Having hypertension during a previous pregnancy. °· Being overweight. °· Being older than 40 years. °· Being pregnant with more than one baby. °· Having diabetes or kidney problems. °SIGNS AND SYMPTOMS °Chronic and gestational hypertension rarely cause symptoms. Preeclampsia has symptoms, which may include: °· Increased protein in your urine. Your health care provider will check for this at every prenatal visit. °· Swelling of your hands and face. °· Rapid weight gain. °· Headaches. °· Visual changes. °· Being bothered by light. °· Abdominal pain, especially in the upper right area. °· Chest pain. °· Shortness of breath. °· Increased reflexes. °· Seizures. These occur with a more severe form of preeclampsia, called eclampsia. °DIAGNOSIS  °You may be diagnosed with hypertension during a regular prenatal exam. At each prenatal visit, you may have: °· Your blood pressure checked. °· A urine test to check for protein in your urine. °The type of hypertension you are diagnosed with depends on when you developed it. It also depends on your specific blood pressure reading. °· Developing hypertension before 20 weeks of pregnancy is consistent with chronic hypertension. °· Developing hypertension   after 20 weeks of pregnancy is consistent with gestational hypertension. °· Hypertension with increased urinary protein is diagnosed as preeclampsia. °· Blood pressure measurements that stay above 160 systolic or 110 diastolic are a sign of severe preeclampsia. °TREATMENT °Treatment for hypertension during pregnancy varies. Treatment depends on the type of hypertension and how serious it is. °· If you take medicine for chronic hypertension, you may need to switch medicines. °¨ Medicines called ACE inhibitors should not be taken  during pregnancy. °¨ Low-dose aspirin may be suggested for women who have risk factors for preeclampsia. °· If you have gestational hypertension, you may need to take a blood pressure medicine that is safe during pregnancy. Your health care provider will recommend the correct medicine. °· If you have severe preeclampsia, you may need to be in the hospital. Health care providers will watch you and your baby very closely. You also may need to take medicine called magnesium sulfate to prevent seizures and lower blood pressure. °· Sometimes, an early delivery is needed. This may be the case if the condition worsens. It would be done to protect you and your baby. The only cure for preeclampsia is delivery. °· Your health care provider may recommend that you take one low-dose aspirin (81 mg) each day to help prevent high blood pressure during your pregnancy if you are at risk for preeclampsia. You may be at risk for preeclampsia if: °¨ You had preeclampsia or eclampsia during a previous pregnancy. °¨ Your baby did not grow as expected during a previous pregnancy. °¨ You experienced preterm birth with a previous pregnancy. °¨ You experienced a separation of the placenta from the uterus (placental abruption) during a previous pregnancy. °¨ You experienced the loss of your baby during a previous pregnancy. °¨ You are pregnant with more than one baby. °¨ You have other medical conditions, such as diabetes or an autoimmune disease. °HOME CARE INSTRUCTIONS °· Schedule and keep all of your regular prenatal care appointments. This is important. °· Take medicines only as directed by your health care provider. Tell your health care provider about all medicines you take. °· Eat as little salt as possible. °· Get regular exercise. °· Do not drink alcohol. °· Do not use tobacco products. °· Do not drink products with caffeine. °· Lie on your left side when resting. °SEEK IMMEDIATE MEDICAL CARE IF: °· You have severe abdominal  pain. °· You have sudden swelling in your hands, ankles, or face. °· You gain 4 pounds (1.8 kg) or more in 1 week. °· You vomit repeatedly. °· You have vaginal bleeding. °· You do not feel your baby moving as much. °· You have a headache. °· You have blurred or double vision. °· You have muscle twitching or spasms. °· You have shortness of breath. °· You have blue fingernails or lips. °· You have blood in your urine. °MAKE SURE YOU: °· Understand these instructions. °· Will watch your condition. °· Will get help right away if you are not doing well or get worse. °  °This information is not intended to replace advice given to you by your health care provider. Make sure you discuss any questions you have with your health care provider. °  °Document Released: 05/18/2011 Document Revised: 09/20/2014 Document Reviewed: 03/29/2013 °Elsevier Interactive Patient Education ©2016 Elsevier Inc. ° ° °

## 2016-05-01 ENCOUNTER — Encounter: Payer: Self-pay | Admitting: Family Medicine

## 2016-05-02 ENCOUNTER — Encounter (HOSPITAL_COMMUNITY): Payer: Self-pay

## 2016-05-02 ENCOUNTER — Inpatient Hospital Stay (HOSPITAL_COMMUNITY)
Admission: AD | Admit: 2016-05-02 | Discharge: 2016-05-02 | Disposition: A | Discharge status: Home / Self Care | Payer: BLUE CROSS/BLUE SHIELD | Source: Ambulatory Visit | Attending: Obstetrics and Gynecology | Admitting: Obstetrics and Gynecology

## 2016-05-02 DIAGNOSIS — O909 Complication of the puerperium, unspecified: Secondary | ICD-10-CM | POA: Diagnosis not present

## 2016-05-02 DIAGNOSIS — Z7984 Long term (current) use of oral hypoglycemic drugs: Secondary | ICD-10-CM | POA: Diagnosis not present

## 2016-05-02 DIAGNOSIS — O1203 Gestational edema, third trimester: Secondary | ICD-10-CM

## 2016-05-02 DIAGNOSIS — Z8632 Personal history of gestational diabetes: Secondary | ICD-10-CM | POA: Insufficient documentation

## 2016-05-02 DIAGNOSIS — R51 Headache: Secondary | ICD-10-CM | POA: Insufficient documentation

## 2016-05-02 DIAGNOSIS — Z87898 Personal history of other specified conditions: Secondary | ICD-10-CM | POA: Diagnosis present

## 2016-05-02 DIAGNOSIS — D649 Anemia, unspecified: Secondary | ICD-10-CM

## 2016-05-02 DIAGNOSIS — O99013 Anemia complicating pregnancy, third trimester: Secondary | ICD-10-CM

## 2016-05-02 DIAGNOSIS — R609 Edema, unspecified: Secondary | ICD-10-CM | POA: Insufficient documentation

## 2016-05-02 LAB — CBC
HEMATOCRIT: 18.7 % — AB (ref 36.0–46.0)
Hemoglobin: 6.1 g/dL — CL (ref 12.0–15.0)
MCH: 27.5 pg (ref 26.0–34.0)
MCHC: 32.6 g/dL (ref 30.0–36.0)
MCV: 84.2 fL (ref 78.0–100.0)
PLATELETS: 187 10*3/uL (ref 150–400)
RBC: 2.22 MIL/uL — AB (ref 3.87–5.11)
RDW: 15.9 % — AB (ref 11.5–15.5)
WBC: 7.4 10*3/uL (ref 4.0–10.5)

## 2016-05-02 LAB — URINALYSIS, ROUTINE W REFLEX MICROSCOPIC
BILIRUBIN URINE: NEGATIVE
Glucose, UA: NEGATIVE mg/dL
Ketones, ur: NEGATIVE mg/dL
Nitrite: NEGATIVE
PH: 6 (ref 5.0–8.0)
Protein, ur: NEGATIVE mg/dL
SPECIFIC GRAVITY, URINE: 1.015 (ref 1.005–1.030)

## 2016-05-02 LAB — COMPREHENSIVE METABOLIC PANEL
ALBUMIN: 2.9 g/dL — AB (ref 3.5–5.0)
ALT: 24 U/L (ref 14–54)
AST: 17 U/L (ref 15–41)
Alkaline Phosphatase: 75 U/L (ref 38–126)
Anion gap: 7 (ref 5–15)
BUN: 7 mg/dL (ref 6–20)
CHLORIDE: 104 mmol/L (ref 101–111)
CO2: 26 mmol/L (ref 22–32)
CREATININE: 0.57 mg/dL (ref 0.44–1.00)
Calcium: 8.5 mg/dL — ABNORMAL LOW (ref 8.9–10.3)
GFR calc Af Amer: >60 mL/min (ref >60)
GLUCOSE: 97 mg/dL (ref 65–99)
POTASSIUM: 3.5 mmol/L (ref 3.5–5.1)
Sodium: 137 mmol/L (ref 135–145)
Total Bilirubin: 0.5 mg/dL (ref 0.3–1.2)
Total Protein: 5.7 g/dL — ABNORMAL LOW (ref 6.5–8.1)

## 2016-05-02 LAB — URINE MICROSCOPIC-ADD ON

## 2016-05-02 MED ORDER — HYDROCHLOROTHIAZIDE 25 MG PO TABS: 25.0000 mg | ORAL_TABLET | Freq: Every day | ORAL | 0 refills | Status: DC

## 2016-05-02 MED ORDER — FERROUS SULFATE 325 (65 FE) MG PO TABS: 325.0000 mg | ORAL_TABLET | Freq: Three times a day (TID) | ORAL | 0 refills | Status: DC

## 2016-05-02 MED ORDER — ENALAPRIL MALEATE 10 MG PO TABS: 10.0000 mg | ORAL_TABLET | Freq: Every day | ORAL | 0 refills | Status: DC

## 2016-05-02 MED ORDER — IBUPROFEN 800 MG PO TABS
800.0000 mg | ORAL_TABLET | Freq: Once | ORAL | Status: AC
  Administered 2016-05-02: 800 mg via ORAL
  Filled 2016-05-02: qty 1

## 2016-05-02 NOTE — MAU Provider Note (Addendum)
History     CSN: [id number]  Arrival date and time: 05/02/16 [id number]   First Provider Initiated Contact with Patient 05/02/16 2027      Chief Complaint  Patient presents with  . Postpartum Complications   Ashley Pugh is a 35 y.o. [id number] at who is PP x 5 days S/P c-section for pre-eclampsia. She was sent home on vasotec 5mg  q day. She denies hx of chronic hypertension. She is here today with a headache, edema in her feet, and edema under her arms.    Headache   This is a new problem. The current episode started today. The problem occurs intermittently. The problem has been unchanged. The pain is located in the left unilateral region. The pain does not radiate. The pain quality is similar to prior headaches. The pain is at a severity of 7/10. Pertinent negatives include no abdominal pain (no RUQ pain. ), blurred vision or visual change. Nothing aggravates the symptoms. She has tried NSAIDs for the symptoms. The treatment provided moderate relief.    Past Medical History:  Diagnosis Date  . Gestational diabetes   . Hx of preeclampsia, prior pregnancy, currently pregnant     Past Surgical History:  Procedure Laterality Date  . BREAST REDUCTION SURGERY    . CESAREAN SECTION    . CESAREAN SECTION N/A 04/27/2016   Procedure: CESAREAN SECTION;  Surgeon: Levie HeritageJacob J Stinson, DO;  [facility];  Service: Obstetrics;  Laterality: N/A;    History reviewed. No pertinent family history.  Social History  Substance Use Topics  . Smoking status: Never Smoker  . Smokeless tobacco: Never Used  . Alcohol use No    Allergies: No Known Allergies  Prescriptions Prior to Admission  Medication Sig Dispense Refill Last Dose  . enalapril (VASOTEC) 5 MG tablet Take 1 tablet (5 mg total) by mouth daily. 30 tablet 1 05/02/2016 at Unknown time  . ibuprofen (ADVIL,MOTRIN) 600 MG tablet Take 1 tablet (600 mg total) by mouth every 6 (six) hours. 30 tablet 0 05/02/2016 at 1000  . metFORMIN  (GLUCOPHAGE) 500 MG tablet Take 1 tablet (500 mg total) by mouth 2 (two) times daily with a meal. 30 tablet 1 05/02/2016 at Unknown time  . oxyCODONE-acetaminophen (PERCOCET/ROXICET) 5-325 MG tablet Take 1-2 tablets by mouth every 4 (four) hours as needed (pain scale > 7). 30 tablet 0 05/01/2016 at Unknown time  . ACCU-CHEK FASTCLIX LANCETS MISC Inject 1 each into the skin 4 (four) times daily. New dx GDM O24.419 for testing 4 times daily 102 each 12 04/27/2016 at Unknown time  . Aromatic Inhalants (VICKS VAPOINHALER) INHA Inhale 1 puff into the lungs daily as needed (for congestion).   04/26/2016 at Unknown time  . glucose blood (ACCU-CHEK SMARTVIEW) test strip New DX GDM O24.419 for testing 4 times daily 100 each 12 04/27/2016 at Unknown time  . Prenatal MV-Min-FA-Omega-3 (PRENATAL GUMMIES/DHA & FA PO) Take 2 tablets by mouth daily.   04/27/2016 at Unknown time    Review of Systems  Eyes: Negative for blurred vision.  Respiratory: Negative for shortness of breath.   Cardiovascular: Negative for chest pain.  Gastrointestinal: Negative for abdominal pain (no RUQ pain. ).  Neurological: Positive for headaches.   Physical Exam   Blood pressure 142/84, pulse 98, temperature 98.8 F (37.1 C), temperature source Oral, resp. rate 20, height 5\' 3"  (1.6 m), weight 270 lb (122.5 kg), last menstrual period 07/18/2015, SpO2 99 %, unknown if currently breastfeeding.  Physical Exam CBC Latest  Ref Rng & Units 05/02/2016 04/28/2016 04/27/2016  WBC 4.0 - 10.5 K/uL 7.4 15.9(H) 14.9(H)  Hemoglobin 12.0 - 15.0 g/dL 6.1(LL) 8.5(L) 10.5(L)  Hematocrit 36.0 - 46.0 % 18.7(L) 25.3(L) 31.6(L)  Platelets 150 - 400 K/uL 187 220 202    MAU Course  Procedures  MDM 2137: Patient reports that her headache is now 0/10 after ibuprofen.  2146: D/W Dr. Emelda FearFerguson, will get CBC/CMET. If normal can be DC home. Increase vasotec to 10mg  QD and add HCTZ 25mg  x 7 days.  Assessment and Plan   1. Anemia, unspecified anemia type    2. Gestational edema in third trimester    DC home Comfort measures reviewed  RX: vasotec 10mg  Q Day, HCTZ 25mg  QD, Iron sulfate Return to MAU as needed FU with OB as planned  Follow-up Information    [facility]   Specialty:  Obstetrics and Gynecology Contact information: [address] [place] [employer] [place] [id number] [phone]           Tawnya CrookHogan, Heather Donovan 05/02/2016, 8:30 PM

## 2016-05-02 NOTE — MAU Note (Signed)
CRITICAL VALUE ALERT  Critical value received:  Hgb 6.1  Date of notification:  05/02/16  Time of notification:  2214  Critical value read back: yes  Nurse who received alert: Camelia Enganielle Laderius Valbuena RN  MD notified (1st page): Thressa ShellerHeather Hogan CNM  Time of first page:  2214

## 2016-05-02 NOTE — Discharge Instructions (Signed)

## 2016-05-02 NOTE — MAU Note (Signed)
Pt delivered on 8/15 via c/s for severe preeclampsia. C/o bilateral lower extremity swelling. States that she also has a HA, but has not taken anything. States that she takes ibuprofen and it helps. Has not checked BPs at home. Has BP rx that she takes once a day-did take this morning.

## 2016-05-04 ENCOUNTER — Ambulatory Visit (HOSPITAL_COMMUNITY): Admission: RE | Admit: 2016-05-04 | Payer: Medicaid Other | Source: Ambulatory Visit

## 2016-05-06 ENCOUNTER — Inpatient Hospital Stay (HOSPITAL_COMMUNITY): Admission: RE | Admit: 2016-05-06 | Payer: Medicaid Other | Source: Ambulatory Visit | Admitting: Family Medicine

## 2016-05-06 ENCOUNTER — Encounter (HOSPITAL_COMMUNITY): Admission: RE | Payer: Self-pay | Source: Ambulatory Visit

## 2016-05-06 ENCOUNTER — Ambulatory Visit: Payer: BLUE CROSS/BLUE SHIELD | Admitting: *Deleted

## 2016-05-06 VITALS — BP 143/96 | HR 101

## 2016-05-06 DIAGNOSIS — Z4889 Encounter for other specified surgical aftercare: Secondary | ICD-10-CM

## 2016-05-06 SURGERY — Surgical Case
Anesthesia: Regional

## 2016-05-06 NOTE — Progress Notes (Signed)
PICO wound vac and dressing removed without difficulty. Incision healing well, no drainage noted. Wound examined by Dr. Adrian BlackwaterStinson also. Pt advised to keep area clean and dry. Continue BP meds as prescribed. She voiced understanding.

## 2016-06-08 ENCOUNTER — Ambulatory Visit: Payer: BLUE CROSS/BLUE SHIELD | Admitting: Advanced Practice Midwife

## 2016-06-18 ENCOUNTER — Encounter: Payer: Self-pay | Admitting: Family Medicine

## 2016-06-18 ENCOUNTER — Ambulatory Visit (INDEPENDENT_AMBULATORY_CARE_PROVIDER_SITE_OTHER): Payer: BLUE CROSS/BLUE SHIELD | Admitting: Family Medicine

## 2016-06-18 DIAGNOSIS — O133 Gestational [pregnancy-induced] hypertension without significant proteinuria, third trimester: Secondary | ICD-10-CM

## 2016-06-18 DIAGNOSIS — O24419 Gestational diabetes mellitus in pregnancy, unspecified control: Secondary | ICD-10-CM

## 2016-06-18 MED ORDER — METFORMIN HCL 500 MG PO TABS: 500.0000 mg | ORAL_TABLET | Freq: Every day | ORAL | 2 refills | Status: DC

## 2016-06-18 MED ORDER — HYDROCHLOROTHIAZIDE 25 MG PO TABS: 25.0000 mg | ORAL_TABLET | Freq: Every day | ORAL | 2 refills | Status: DC

## 2016-06-18 MED ORDER — ENALAPRIL MALEATE 10 MG PO TABS: 10.0000 mg | ORAL_TABLET | Freq: Every day | ORAL | 2 refills | Status: DC

## 2016-06-18 NOTE — Progress Notes (Signed)
Subjective:     Ashley Pugh is a 35 y.o. female who presents for a postpartum visit. She is 6 weeks postpartum following a 04/27/2016. I have fully reviewed the prenatal and intrapartum course. The delivery was at 35 gestational weeks. Outcome: Cesarean. Anesthesia: Epidual. Postpartum course has been  . Baby's course has been  uncomplicated. Baby is feeding by bottle. Bleeding none at this time. Bowel function is normal. Bladder function is normal. Patient is sexually active. Contraception method is Tubal Ligation. Postpartum depression screening: negative  The following portions of the patient's history were reviewed and updated as appropriate: allergies, current medications, past family history, past medical history, past social history, past surgical history and problem list.  Review of Systems Pertinent items noted in HPI and remainder of comprehensive ROS otherwise negative.   Objective:    BP (!) 141/93   Pulse 75   Wt 239 lb 8 oz (108.6 kg)   BMI 42.43 kg/m    General:  alert, cooperative and no distress  Lungs: clear to auscultation bilaterally  Heart:  regular rate and rhythm, S1, S2 normal, no murmur, click, rub or gallop  Abdomen: soft, non-tender; bowel sounds normal; no masses,  no organomegaly. Incision clean, dry, intact.        Assessment:    1. Normal postpartum exam.  2. CHTN 3. DM2  Plan:    1. Contraception: tubal ligation 2. 2hr GTT not necessary as pt taking metformin - will continue. Will need A1c in about 6-8 weeks. 3. Start HCTZ, continue Vasotec.  4. Refer to family medicine 5. Follow up in: yearyear or as needed.

## 2016-06-18 NOTE — Addendum Note (Signed)
Addended by: Levie HeritageSTINSON, Winferd Wease J on: 06/18/2016 09:56 AM   Modules accepted: Orders

## 2016-06-21 ENCOUNTER — Other Ambulatory Visit: Payer: Self-pay | Admitting: Advanced Practice Midwife

## 2016-08-04 ENCOUNTER — Encounter (HOSPITAL_COMMUNITY): Payer: Self-pay

## 2017-11-07 ENCOUNTER — Other Ambulatory Visit (HOSPITAL_COMMUNITY)
Admission: RE | Admit: 2017-11-07 | Discharge: 2017-11-07 | Disposition: A | Discharge status: Home / Self Care | Payer: Medicaid Other | Source: Ambulatory Visit | Attending: Family Medicine | Admitting: Family Medicine

## 2017-11-07 ENCOUNTER — Encounter: Payer: Self-pay | Admitting: Family Medicine

## 2017-11-07 ENCOUNTER — Ambulatory Visit (INDEPENDENT_AMBULATORY_CARE_PROVIDER_SITE_OTHER): Payer: Medicaid Other | Admitting: Family Medicine

## 2017-11-07 VITALS — BP 127/72 | HR 79 | Wt 221.3 lb

## 2017-11-07 DIAGNOSIS — Z309 Encounter for contraceptive management, unspecified: Secondary | ICD-10-CM | POA: Diagnosis not present

## 2017-11-07 DIAGNOSIS — Z1151 Encounter for screening for human papillomavirus (HPV): Secondary | ICD-10-CM | POA: Insufficient documentation

## 2017-11-07 DIAGNOSIS — Z0000 Encounter for general adult medical examination without abnormal findings: Secondary | ICD-10-CM

## 2017-11-07 DIAGNOSIS — Z0001 Encounter for general adult medical examination with abnormal findings: Secondary | ICD-10-CM | POA: Insufficient documentation

## 2017-11-07 DIAGNOSIS — N898 Other specified noninflammatory disorders of vagina: Secondary | ICD-10-CM

## 2017-11-07 DIAGNOSIS — Z79899 Other long term (current) drug therapy: Secondary | ICD-10-CM | POA: Insufficient documentation

## 2017-11-07 DIAGNOSIS — I10 Essential (primary) hypertension: Secondary | ICD-10-CM | POA: Insufficient documentation

## 2017-11-07 DIAGNOSIS — Z01419 Encounter for gynecological examination (general) (routine) without abnormal findings: Secondary | ICD-10-CM | POA: Insufficient documentation

## 2017-11-07 NOTE — Progress Notes (Signed)
Pt has some vag d/c which is concerning. Present for 3 months and is clear. No odor.No birth control and does not desire any. Would like STI testing.

## 2017-11-07 NOTE — Progress Notes (Signed)
GYNECOLOGY ANNUAL PREVENTATIVE CARE ENCOUNTER NOTE  Subjective:   Ashley Pugh is a 37 y.o. [phone] female here for a routine annual gynecologic exam.  Current complaints: thin, white vaginal discharge x a week. No foul odor.   Denies abnormal vaginal bleeding, discharge, pelvic pain, problems with intercourse or other gynecologic concerns.    Gynecologic History Patient's last menstrual period was 10/30/2017. Patient is sexually active  Contraception: tubal ligation Last Pap: 2016. Results were: normal Last mammogram: n/a.  Obstetric History OB History  Gravida Para Term Preterm AB Living  5 3 1 2 2 3   SAB TAB Ectopic Multiple Live Births  1 1 0 0 3    # Outcome Date GA Lbr Len/2nd Weight Sex Delivery Anes PTL Lv  5 Preterm 04/27/16 [redacted]w[redacted]d  5 lb 8.9 oz (2.52 kg) F CS-LTranv EPI, Spinal  LIV  4 Preterm 11/15/09 [redacted]w[redacted]d  4 lb 2 oz (1.871 kg) M CS-LTranv None  LIV     Complications: Severe preeclampsia, third trimester  3 TAB 2005          2 Term 07/31/03 [redacted]w[redacted]d  5 lb 1 oz (2.296 kg) M CS-LTranv EPI  LIV     Complications: Failure to progress in labor  1 SAB 2003              Past Medical History:  Diagnosis Date  . Gestational diabetes   . Hx of preeclampsia, prior pregnancy, currently pregnant   . Hypertension     Past Surgical History:  Procedure Laterality Date  . BREAST REDUCTION SURGERY    . CESAREAN SECTION    . CESAREAN SECTION N/A 04/27/2016   Procedure: CESAREAN SECTION;  Surgeon: Levie Heritage, DO;  Location: [facility];  Service: Obstetrics;  Laterality: N/A;    Current Outpatient Medications on File Prior to Visit  Medication Sig Dispense Refill  . enalapril (VASOTEC) 10 MG tablet Take 1 tablet (10 mg total) by mouth daily. (Patient not taking: Reported on 11/07/2017) 30 tablet 2  . ferrous sulfate 325 (65 FE) MG tablet TAKE ONE TABLET BY MOUTH THREE TIMES DAILY WITH MEALS (Patient not taking: Reported on 11/07/2017) 90 tablet 0  .  hydrochlorothiazide (HYDRODIURIL) 25 MG tablet Take 1 tablet (25 mg total) by mouth daily. (Patient not taking: Reported on 11/07/2017) 30 tablet 2  . metFORMIN (GLUCOPHAGE) 500 MG tablet Take 1 tablet (500 mg total) by mouth daily with breakfast. (Patient not taking: Reported on 11/07/2017) 30 tablet 2  . Prenatal MV-Min-FA-Omega-3 (PRENATAL GUMMIES/DHA & FA PO) Take 2 tablets by mouth daily.     No current facility-administered medications on file prior to visit.     No Known Allergies  Social History   Socioeconomic History  . Marital status: Married    Spouse name: Not on file  . Number of children: Not on file  . Years of education: Not on file  . Highest education level: Not on file  Social Needs  . Financial resource strain: Not on file  . Food insecurity - worry: Not on file  . Food insecurity - inability: Not on file  . Transportation needs - medical: Not on file  . Transportation needs - non-medical: Not on file  Occupational History  . Not on file  Tobacco Use  . Smoking status: Never Smoker  . Smokeless tobacco: Never Used  Substance and Sexual Activity  . Alcohol use: No  . Drug use: No  . Sexual activity: Yes  Birth control/protection: None  Other Topics Concern  . Not on file  Social History Narrative  . Not on file    History reviewed. No pertinent family history.  The following portions of the patient's history were reviewed and updated as appropriate: allergies, current medications, past family history, past medical history, past social history, past surgical history and problem list.  Review of Systems Pertinent items noted in HPI and remainder of comprehensive ROS otherwise negative.   Objective:  BP 127/72   Pulse 79   Wt 221 lb 4.8 oz (100.4 kg)   LMP 10/30/2017   BMI 39.20 kg/m  CONSTITUTIONAL: Well-developed, well-nourished female in no acute distress.  HENT:  Normocephalic, atraumatic, External right and left ear normal. Oropharynx is  clear and moist EYES: Conjunctivae and EOM are normal. Pupils are equal, round, and reactive to light. No scleral icterus.  NECK: Normal range of motion, supple, no masses.  Normal thyroid.   CARDIOVASCULAR: Normal heart rate noted, regular rhythm RESPIRATORY: Clear to auscultation bilaterally. Effort and breath sounds normal, no problems with respiration noted. BREASTS: Symmetric in size. No masses, skin changes, nipple drainage, or lymphadenopathy. ABDOMEN: Soft, normal bowel sounds, no distention noted.  No tenderness, rebound or guarding.  PELVIC: Normal appearing external genitalia; normal appearing vaginal mucosa and cervix.  No abnormal discharge noted.  Pap smear obtained.  Normal uterine size, no other palpable masses, no uterine or adnexal tenderness. MUSCULOSKELETAL: Normal range of motion. No tenderness.  No cyanosis, clubbing, or edema.  2+ distal pulses. SKIN: Skin is warm and dry. No rash noted. Not diaphoretic. No erythema. No pallor. NEUROLOGIC: Alert and oriented to person, place, and time. Normal reflexes, muscle tone coordination. No cranial nerve deficit noted. PSYCHIATRIC: Normal mood and affect. Normal behavior. Normal judgment and thought content.  Assessment:  Annual gynecologic examination with pap smear   Plan:   1. Annual physical exam Desires vaginal cultures.  - Cytology - PAP  2. Vaginal discharge Vaginal cultures off PAP. - Cytology - PAP   Routine preventative health maintenance measures emphasized. Please refer to After Visit Summary for other counseling recommendations.    Candelaria CelesteJacob Stinson, DO [facility]

## 2017-11-07 NOTE — Patient Instructions (Signed)
Health Maintenance, Female Adopting a healthy lifestyle and getting preventive care can go a long way to promote health and wellness. Talk with your health care provider about what schedule of regular examinations is right for you. This is a good chance for you to check in with your provider about disease prevention and staying healthy. In between checkups, there are plenty of things you can do on your own. Experts have done a lot of research about which lifestyle changes and preventive measures are most likely to keep you healthy. Ask your health care provider for more information. Weight and diet Eat a healthy diet  Be sure to include plenty of vegetables, fruits, low-fat dairy products, and lean protein.  Do not eat a lot of foods high in solid fats, added sugars, or salt.  Get regular exercise. This is one of the most important things you can do for your health. ? Most adults should exercise for at least 150 minutes each week. The exercise should increase your heart rate and make you sweat (moderate-intensity exercise). ? Most adults should also do strengthening exercises at least twice a week. This is in addition to the moderate-intensity exercise.  Maintain a healthy weight  Body mass index (BMI) is a measurement that can be used to identify possible weight problems. It estimates body fat based on height and weight. Your health care provider can help determine your BMI and help you achieve or maintain a healthy weight.  For females 20 years of age and older: ? A BMI below 18.5 is considered underweight. ? A BMI of 18.5 to 24.9 is normal. ? A BMI of 25 to 29.9 is considered overweight. ? A BMI of 30 and above is considered obese.  Watch levels of cholesterol and blood lipids  You should start having your blood tested for lipids and cholesterol at 39 years of age, then have this test every 5 years.  You may need to have your cholesterol levels checked more often if: ? Your lipid or  cholesterol levels are high. ? You are older than 39 years of age. ? You are at high risk for heart disease.  Cancer screening Lung Cancer  Lung cancer screening is recommended for adults 55-80 years old who are at high risk for lung cancer because of a history of smoking.  A yearly low-dose CT scan of the lungs is recommended for people who: ? Currently smoke. ? Have quit within the past 15 years. ? Have at least a 30-pack-year history of smoking. A pack year is smoking an average of one pack of cigarettes a day for 1 year.  Yearly screening should continue until it has been 15 years since you quit.  Yearly screening should stop if you develop a health problem that would prevent you from having lung cancer treatment.  Breast Cancer  Practice breast self-awareness. This means understanding how your breasts normally appear and feel.  It also means doing regular breast self-exams. Let your health care provider know about any changes, no matter how small.  If you are in your 20s or 30s, you should have a clinical breast exam (CBE) by a health care provider every 1-3 years as part of a regular health exam.  If you are 40 or older, have a CBE every year. Also consider having a breast X-ray (mammogram) every year.  If you have a family history of breast cancer, talk to your health care provider about genetic screening.  If you are at high risk   for breast cancer, talk to your health care provider about having an MRI and a mammogram every year.  Breast cancer gene (BRCA) assessment is recommended for women who have family members with BRCA-related cancers. BRCA-related cancers include: ? Breast. ? Ovarian. ? Tubal. ? Peritoneal cancers.  Results of the assessment will determine the need for genetic counseling and BRCA1 and BRCA2 testing.  Cervical Cancer Your health care provider may recommend that you be screened regularly for cancer of the pelvic organs (ovaries, uterus, and  vagina). This screening involves a pelvic examination, including checking for microscopic changes to the surface of your cervix (Pap test). You may be encouraged to have this screening done every 3 years, beginning at age 22.  For women ages 56-65, health care providers may recommend pelvic exams and Pap testing every 3 years, or they may recommend the Pap and pelvic exam, combined with testing for human papilloma virus (HPV), every 5 years. Some types of HPV increase your risk of cervical cancer. Testing for HPV may also be done on women of any age with unclear Pap test results.  Other health care providers may not recommend any screening for nonpregnant women who are considered low risk for pelvic cancer and who do not have symptoms. Ask your health care provider if a screening pelvic exam is right for you.  If you have had past treatment for cervical cancer or a condition that could lead to cancer, you need Pap tests and screening for cancer for at least 20 years after your treatment. If Pap tests have been discontinued, your risk factors (such as having a new sexual partner) need to be reassessed to determine if screening should resume. Some women have medical problems that increase the chance of getting cervical cancer. In these cases, your health care provider may recommend more frequent screening and Pap tests.  Colorectal Cancer  This type of cancer can be detected and often prevented.  Routine colorectal cancer screening usually begins at 39 years of age and continues through 39 years of age.  Your health care provider may recommend screening at an earlier age if you have risk factors for colon cancer.  Your health care provider may also recommend using home test kits to check for hidden blood in the stool.  A small camera at the end of a tube can be used to examine your colon directly (sigmoidoscopy or colonoscopy). This is done to check for the earliest forms of colorectal  cancer.  Routine screening usually begins at age 33.  Direct examination of the colon should be repeated every 5-10 years through 39 years of age. However, you may need to be screened more often if early forms of precancerous polyps or small growths are found.  Skin Cancer  Check your skin from head to toe regularly.  Tell your health care provider about any new moles or changes in moles, especially if there is a change in a mole's shape or color.  Also tell your health care provider if you have a mole that is larger than the size of a pencil eraser.  Always use sunscreen. Apply sunscreen liberally and repeatedly throughout the day.  Protect yourself by wearing long sleeves, pants, a wide-brimmed hat, and sunglasses whenever you are outside.  Heart disease, diabetes, and high blood pressure  High blood pressure causes heart disease and increases the risk of stroke. High blood pressure is more likely to develop in: ? People who have blood pressure in the high end of  the normal range (130-139/85-89 mm Hg). ? People who are overweight or obese. ? People who are African American.  If you are 21-29 years of age, have your blood pressure checked every 3-5 years. If you are 3 years of age or older, have your blood pressure checked every year. You should have your blood pressure measured twice-once when you are at a hospital or clinic, and once when you are not at a hospital or clinic. Record the average of the two measurements. To check your blood pressure when you are not at a hospital or clinic, you can use: ? An automated blood pressure machine at a pharmacy. ? A home blood pressure monitor.  If you are between 17 years and 37 years old, ask your health care provider if you should take aspirin to prevent strokes.  Have regular diabetes screenings. This involves taking a blood sample to check your fasting blood sugar level. ? If you are at a normal weight and have a low risk for diabetes,  have this test once every three years after 39 years of age. ? If you are overweight and have a high risk for diabetes, consider being tested at a younger age or more often. Preventing infection Hepatitis B  If you have a higher risk for hepatitis B, you should be screened for this virus. You are considered at high risk for hepatitis B if: ? You were born in a country where hepatitis B is common. Ask your health care provider which countries are considered high risk. ? Your parents were born in a high-risk country, and you have not been immunized against hepatitis B (hepatitis B vaccine). ? You have HIV or AIDS. ? You use needles to inject street drugs. ? You live with someone who has hepatitis B. ? You have had sex with someone who has hepatitis B. ? You get hemodialysis treatment. ? You take certain medicines for conditions, including cancer, organ transplantation, and autoimmune conditions.  Hepatitis C  Blood testing is recommended for: ? Everyone born from [birth year] through 1967. ? Anyone with known risk factors for hepatitis C.  Sexually transmitted infections (STIs)  You should be screened for sexually transmitted infections (STIs) including gonorrhea and chlamydia if: ? You are sexually active and are younger than 39 years of age. ? You are older than 39 years of age and your health care provider tells you that you are at risk for this type of infection. ? Your sexual activity has changed since you were last screened and you are at an increased risk for chlamydia or gonorrhea. Ask your health care provider if you are at risk.  If you do not have HIV, but are at risk, it may be recommended that you take a prescription medicine daily to prevent HIV infection. This is called pre-exposure prophylaxis (PrEP). You are considered at risk if: ? You are sexually active and do not regularly use condoms or know the HIV status of your partner(s). ? You take drugs by injection. ? You are  sexually active with a partner who has HIV.  Talk with your health care provider about whether you are at high risk of being infected with HIV. If you choose to begin PrEP, you should first be tested for HIV. You should then be tested every 3 months for as long as you are taking PrEP. Pregnancy  If you are premenopausal and you may become pregnant, ask your health care provider about preconception counseling.  If you may become  pregnant, take 400 to 800 micrograms (mcg) of folic acid every day.  If you want to prevent pregnancy, talk to your health care provider about birth control (contraception). Osteoporosis and menopause  Osteoporosis is a disease in which the bones lose minerals and strength with aging. This can result in serious bone fractures. Your risk for osteoporosis can be identified using a bone density scan.  If you are 39 years of age or older, or if you are at risk for osteoporosis and fractures, ask your health care provider if you should be screened.  Ask your health care provider whether you should take a calcium or vitamin D supplement to lower your risk for osteoporosis.  Menopause may have certain physical symptoms and risks.  Hormone replacement therapy may reduce some of these symptoms and risks. Talk to your health care provider about whether hormone replacement therapy is right for you. Follow these instructions at home:  Schedule regular health, dental, and eye exams.  Stay current with your immunizations.  Do not use any tobacco products including cigarettes, chewing tobacco, or electronic cigarettes.  If you are pregnant, do not drink alcohol.  If you are breastfeeding, limit how much and how often you drink alcohol.  Limit alcohol intake to no more than 1 drink per day for nonpregnant women. One drink equals 12 ounces of beer, 5 ounces of wine, or 1 ounces of hard liquor.  Do not use street drugs.  Do not share needles.  Ask your health care  provider for help if you need support or information about quitting drugs.  Tell your health care provider if you often feel depressed.  Tell your health care provider if you have ever been abused or do not feel safe at home. This information is not intended to replace advice given to you by your health care provider. Make sure you discuss any questions you have with your health care provider. Document Released: 03/15/2011 Document Revised: 02/05/2016 Document Reviewed: 06/03/2015 Elsevier Interactive Patient Education  2018 [place]  Dear Ashley Pugh  We are excited to introduce MyChart, a new best-in-class service that provides you online access to important information in your electronic medical record. We want to make it easier for you to view your health information - all in one secure location - when and where you need it. We expect MyChart will enhance the quality of care and service we provide. Use the activation code below to enroll in MyChart online at [email or web address]  When you register for MyChart, you can:  Marland Kitchen View your test results. . Communicate securely with your physician's office.  . View your medical history, allergies, medications, and immunizations. . Conveniently print information such as your medication lists.  If you are age 31 or older and want a member of your family to have access to your record, you must provide written consent by completing a proxy form available at our facility. Please speak to our clinical staff about guidelines regarding accounts for patients younger than age 40.  As you activate your MyChart account and need any technical assistance, please call the MyChart technical support line at [phone]).  Thank you for using MyChart as your new health and wellness resource!  MyChart Activation Code:  [email or web address] Expires: 12/22/2017  2:50 PM           [facility]  [address] [place], [place] [address]

## 2017-11-09 LAB — CYTOLOGY - PAP
ADEQUACY: ABSENT
Bacterial vaginitis: POSITIVE — AB
CANDIDA VAGINITIS: NEGATIVE
CHLAMYDIA, DNA PROBE: NEGATIVE
Diagnosis: NEGATIVE
HPV (WINDOPATH): NOT DETECTED
NEISSERIA GONORRHEA: NEGATIVE
TRICH (WINDOWPATH): NEGATIVE

## 2017-11-11 ENCOUNTER — Encounter: Payer: Self-pay | Admitting: Family Medicine

## 2017-11-11 ENCOUNTER — Other Ambulatory Visit: Payer: Self-pay | Admitting: Family Medicine

## 2017-11-11 MED ORDER — METRONIDAZOLE 500 MG PO TABS: 500.0000 mg | ORAL_TABLET | Freq: Two times a day (BID) | ORAL | 0 refills | Status: DC

## 2017-12-13 ENCOUNTER — Encounter: Payer: Self-pay | Admitting: *Deleted

## 2018-09-20 ENCOUNTER — Encounter (HOSPITAL_COMMUNITY): Payer: Self-pay | Admitting: Emergency Medicine

## 2018-09-20 ENCOUNTER — Ambulatory Visit (HOSPITAL_COMMUNITY)
Admission: EM | Admit: 2018-09-20 | Discharge: 2018-09-20 | Disposition: A | Discharge status: Home / Self Care | Payer: Commercial Managed Care - PPO | Attending: Family Medicine | Admitting: Family Medicine

## 2018-09-20 ENCOUNTER — Other Ambulatory Visit: Payer: Self-pay

## 2018-09-20 DIAGNOSIS — M79641 Pain in right hand: Secondary | ICD-10-CM

## 2018-09-20 NOTE — Discharge Instructions (Addendum)
Elevate hand for relief of swelling.  Avoid wearing constricting tight glove or garment on the affected hand

## 2018-09-20 NOTE — ED Provider Notes (Signed)
[facility]    CSN: [id number] Arrival date & time: 09/20/18  1220     History   Chief Complaint Chief Complaint  Patient presents with  . Hand Pain    HPI Ashley Pugh is a 38 y.o. female.   Patient has had swollen area on her right proximal long finger for about 3 years.  There was no history of trauma or injury.  It is not really painful but she notices when the hand is in a dependent position there is more swelling to the area.  She is frankly concerned that this might be some sort of tumor or cancer.  HPI  Past Medical History:  Diagnosis Date  . Gestational diabetes   . Hypertension     Patient Active Problem List   Diagnosis Date Noted  . Gestational hypertension 04/15/2016  . Depression affecting pregnancy in second trimester, antepartum 03/01/2016  . Gestational diabetes mellitus (GDM) affecting pregnancy 11/20/2015  . Previous cesarean section complicating pregnancy, antepartum condition or complication 11/17/2015    Past Surgical History:  Procedure Laterality Date  . BREAST REDUCTION SURGERY    . CESAREAN SECTION    . CESAREAN SECTION N/A 04/27/2016   Procedure: CESAREAN SECTION;  Surgeon: Levie Heritage, DO;  Location: [facility];  Service: Obstetrics;  Laterality: N/A;    OB History    Gravida  5   Para  3   Term  1   Preterm  2   AB  2   Living  3     SAB  1   TAB  1   Ectopic  0   Multiple  0   Live Births  3            Home Medications    Prior to Admission medications   Medication Sig Start Date End Date Taking? Authorizing Provider  metroNIDAZOLE (FLAGYL) 500 MG tablet Take 1 tablet (500 mg total) by mouth 2 (two) times daily. 11/11/17   Levie Heritage, DO  Prenatal MV-Min-FA-Omega-3 (PRENATAL GUMMIES/DHA & FA PO) Take 2 tablets by mouth daily.    [provider]    Family History History reviewed. No pertinent family history.  Social History Social History   Tobacco Use  . Smoking  status: Never Smoker  . Smokeless tobacco: Never Used  Substance Use Topics  . Alcohol use: No  . Drug use: No     Allergies   Patient has no known allergies.   Review of Systems Review of Systems  Constitutional: Negative.   Musculoskeletal:       Swollen right middle finger  All other systems reviewed and are negative.    Physical Exam Triage Vital Signs ED Triage Vitals  Enc Vitals Group     BP 09/20/18 1340 116/67     Pulse Rate 09/20/18 1340 75     Resp 09/20/18 1340 16     Temp 09/20/18 1340 97.9 F (36.6 C)     Temp Source 09/20/18 1340 Temporal     SpO2 09/20/18 1340 100 %     Weight --      Height --      Head Circumference --      Peak Flow --      Pain Score 09/20/18 1338 0     Pain Loc --      Pain Edu? --      Excl. in GC? --    No data found.  Updated Vital Signs  BP 116/67 (BP Location: Right Arm)   Pulse 75   Temp 97.9 F (36.6 C) (Temporal)   Resp 16   LMP 09/09/2018   SpO2 100%   Visual Acuity Right Eye Distance:   Left Eye Distance:   Bilateral Distance:    Right Eye Near:   Left Eye Near:    Bilateral Near:     Physical Exam Vitals signs and nursing note reviewed.  Constitutional:      Appearance: Normal appearance.  Skin:    Comments: Right middle finger proximal phalanx there is some bluish discoloration with swelling.  It is compressible.  It does get larger when finger is in dependent position and resolves when she elevates her hand.  I suspect this represents a venous anomaly possible varicosity.  Neurological:     Mental Status: She is alert.      UC Treatments / Results  Labs (all labs ordered are listed, but only abnormal results are displayed) Labs Reviewed - No data to display  EKG None  Radiology No results found.  Procedures Procedures (including critical care time)  Medications Ordered in UC Medications - No data to display  Initial Impression / Assessment and Plan / UC Course  I have reviewed  the triage vital signs and the nursing notes.  Pertinent labs & imaging results that were available during my care of the patient were reviewed by me and considered in my medical decision making (see chart for details).     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  I recommended trying not to leave hand in dependent position for long periods of time.  We will give her the name of hand surgeon if she desires another opinion Final Clinical Impressions(s) / UC Diagnoses   Final diagnoses:  None   Discharge Instructions   None    ED Prescriptions    None     Controlled Substance Prescriptions [place] Controlled Substance Registry consulted? No   Frederica Kuster, MD 09/20/18 [phone]

## 2018-09-20 NOTE — ED Triage Notes (Signed)
Right middle finger swelling.  Soft bump to right middle finger.  Does not hurt, area on finger has increased in size and has color changes-bruise.  No injury.  Patient reports this area has been on finger for 3 years

## 2020-01-10 ENCOUNTER — Other Ambulatory Visit: Payer: Self-pay

## 2020-01-11 ENCOUNTER — Encounter: Payer: Self-pay | Admitting: Nurse Practitioner

## 2020-01-11 ENCOUNTER — Encounter: Payer: Self-pay | Admitting: Internal Medicine

## 2020-01-11 ENCOUNTER — Ambulatory Visit (INDEPENDENT_AMBULATORY_CARE_PROVIDER_SITE_OTHER): Payer: Commercial Managed Care - PPO | Admitting: Internal Medicine

## 2020-01-11 VITALS — BP 110/70 | HR 82 | Temp 97.8°F | Ht 63.0 in | Wt 246.3 lb

## 2020-01-11 DIAGNOSIS — R42 Dizziness and giddiness: Secondary | ICD-10-CM | POA: Diagnosis not present

## 2020-01-11 DIAGNOSIS — M79644 Pain in right finger(s): Secondary | ICD-10-CM

## 2020-01-11 DIAGNOSIS — Z124 Encounter for screening for malignant neoplasm of cervix: Secondary | ICD-10-CM | POA: Diagnosis not present

## 2020-01-11 DIAGNOSIS — R739 Hyperglycemia, unspecified: Secondary | ICD-10-CM | POA: Diagnosis not present

## 2020-01-11 DIAGNOSIS — R7302 Impaired glucose tolerance (oral): Secondary | ICD-10-CM

## 2020-01-11 DIAGNOSIS — Z800 Family history of malignant neoplasm of digestive organs: Secondary | ICD-10-CM

## 2020-01-11 LAB — POCT GLYCOSYLATED HEMOGLOBIN (HGB A1C): Hemoglobin A1C: 6.4 % — AB (ref 4.0–5.6)

## 2020-01-11 NOTE — Progress Notes (Signed)
New Patient Office Visit     This visit occurred during the SARS-CoV-2 public health emergency.  Safety protocols were in place, including screening questions prior to the visit, additional usage of staff PPE, and extensive cleaning of exam room while observing appropriate contact time as indicated for disinfecting solutions.    CC/Reason for Visit: Establish care, discuss acute and chronic conditions Previous PCP: None Last Visit: Unknown  HPI: Ashley Pugh is a 39 y.o. female who is coming in today for the above mentioned reasons. Past Medical History is significant for: Gestational hypertension and diabetes with preeclampsia in her last 2 pregnancies.  Ever since then she was advised to take daily aspirin 81 mg, this is the only medication she takes.  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  She has 3 children ages 40, 60, 3 she is married.  She has no known drug allergies, her past surgical history significant for 3 C-sections and breast augmentation.  She does not smoke, she does not drink, her family history is significant for a father who was diagnosed with colon cancer in his early 14s and died at age 76 from this, Hypertension.  She is concerned that she may not again have diabetes that has been lightheaded and her mouth has been very dry.  She also has pain in the first phalange of her right middle digit with a "bubble" that has been present for years.  This is now limiting her function.   Past Medical/Surgical History: Past Medical History:  Diagnosis Date  . Gestational diabetes   . Hypertension   . IGT (impaired glucose tolerance)     Past Surgical History:  Procedure Laterality Date  . BREAST REDUCTION SURGERY    . CESAREAN SECTION    . CESAREAN SECTION N/A 04/27/2016   Procedure: CESAREAN SECTION;  Surgeon: Truett Mainland, DO;  Location: [place];  Service: Obstetrics;  Laterality: N/A;    Social History:  reports that she has never  smoked. She has never used smokeless tobacco. She reports that she does not drink alcohol or use drugs.  Allergies: No Known Allergies  Family History:  Family History  Problem Relation Age of Onset  . Colon cancer Father   . Hypertension Father     No current outpatient medications on file.  Review of Systems:  Constitutional: Denies fever, chills, diaphoresis, appetite change and fatigue.  HEENT: Denies photophobia, eye pain, redness, hearing loss, ear pain, congestion, sore throat, rhinorrhea, sneezing, mouth sores, trouble swallowing, neck pain, neck stiffness and tinnitus.   Respiratory: Denies SOB, DOE, cough, chest tightness,  and wheezing.   Cardiovascular: Denies chest pain, palpitations and leg swelling.  Gastrointestinal: Denies nausea, vomiting, abdominal pain, diarrhea, constipation, blood in stool and abdominal distention.  Genitourinary: Denies dysuria, urgency, frequency, hematuria, flank pain and difficulty urinating.  Endocrine: Denies: hot or cold intolerance, sweats, changes in hair or nails, polyuria, polydipsia. Musculoskeletal: Denies myalgias, back pain, joint swelling, arthralgias and gait problem.  Skin: Denies pallor, rash and wound.  Neurological: Denies  seizures, syncope, weakness, numbness and headaches.  Hematological: Denies adenopathy. Easy bruising, personal or family bleeding history  Psychiatric/Behavioral: Denies suicidal ideation, mood changes, confusion, nervousness, sleep disturbance and agitation    Physical Exam: Vitals:   01/11/20 0840  BP: 110/70  Pulse: 82  Temp: 97.8 F (36.6 C)  TempSrc: Temporal  SpO2: 99%  Weight: 246 lb 4.8 oz (111.7 kg)  Height: 5\' 3"  (1.6 m)   Body  mass index is 43.63 kg/m.  Constitutional: NAD, calm, comfortable Eyes: PERRL, lids and conjunctivae normal ENMT: Mucous membranes are moist.  Respiratory: clear to auscultation bilaterally, no wheezing, no crackles. Normal respiratory effort. No accessory  muscle use.  Cardiovascular: Regular rate and rhythm, no murmurs / rubs / gallops. No extremity edema.   Neurologic: Grossly intact and nonfocal Psychiatric: Normal judgment and insight. Alert and oriented x 3. Normal mood.    Impression and Plan:  Family history of colon cancer  - Plan: Ambulatory referral to Gastroenterology  Screening for cervical cancer  - Plan: Ambulatory referral to Gynecology  Finger pain, right  -Appears to have some kind of cyst with a bluish tint on the palmar surface of the proximal phalanges of her right middle finger. -Refer to hand surgery.  IGT (impaired glucose tolerance) -A1c in office today is 6.4, she is very close to being a diabetic. -Have advised aggressive lifestyle modifications and we will recheck her A1c when she returns in 3 months.  Morbid obesity (HCC) -Discussed healthy lifestyle, including increased physical activity and better food choices to promote weight loss.    Patient Instructions  -Nice seeing you today!!  -Referral to hand specialist for your finger cyst.  -Schedule follow up in 3 months or sooner for your physical. Please come in fasting that day.     Chaya Jan, MD [facility]

## 2020-01-11 NOTE — Patient Instructions (Signed)
-  Nice seeing you today!!  -Referral to hand specialist for your finger cyst.  -Schedule follow up in 3 months or sooner for your physical. Please come in fasting that day.

## 2020-01-25 ENCOUNTER — Encounter: Payer: Self-pay | Admitting: Nurse Practitioner

## 2020-01-25 ENCOUNTER — Ambulatory Visit: Payer: No Typology Code available for payment source | Admitting: Nurse Practitioner

## 2020-01-25 ENCOUNTER — Other Ambulatory Visit: Payer: Self-pay

## 2020-01-25 VITALS — BP 120/80 | HR 76 | Temp 98.9°F | Ht 63.0 in | Wt 247.0 lb

## 2020-01-25 DIAGNOSIS — Z800 Family history of malignant neoplasm of digestive organs: Secondary | ICD-10-CM | POA: Diagnosis not present

## 2020-01-25 DIAGNOSIS — Z1211 Encounter for screening for malignant neoplasm of colon: Secondary | ICD-10-CM | POA: Diagnosis not present

## 2020-01-25 NOTE — Progress Notes (Signed)
Reviewed and agree with management plans. Please schedule colonoscopy given the family history of colon cancer.   Maddix Kliewer L. Orvan Falconer, MD, MPH

## 2020-01-25 NOTE — Patient Instructions (Signed)
If you are age 41 or older, your body mass index should be between 23-30. Your Body mass index is 43.75 kg/m. If this is out of the aforementioned range listed, please consider follow up with your Primary Care Provider.  If you are age 65 or younger, your body mass index should be between 19-25. Your Body mass index is 43.75 kg/m. If this is out of the aformentioned range listed, please consider follow up with your Primary Care Provider.   It has been recommended to you by your physician that you have a(n) colonoscopy completed. Per your request, we did not schedule the procedure(s) today. Please contact our office at [phone] should you decide to have the procedure completed.  CPT Code- 76720  Please check with your insurance company regarding the colonoscopy screening.  Due to recent changes in healthcare laws, you may see the results of your imaging and laboratory studies on MyChart before your provider has had a chance to review them.  We understand that in some cases there may be results that are confusing or concerning to you. Not all laboratory results come back in the same time frame and the provider may be waiting for multiple results in order to interpret others.  Please give [place] 48 hours in order for your provider to thoroughly review all the results before contacting the office for clarification of your results.   Thank you for choosing [facility] Arnaldo Natal, CRNP

## 2020-01-25 NOTE — Progress Notes (Signed)
01/25/2020 Annabella Elford [id number] 09-20-1980   CHIEF COMPLAINT: Schedule a screening colonoscopy   HISTORY OF PRESENT ILLNESS:  Maddyson Keil is a 39 year old female with a past medical history of obesity and gestations diabetes. She was referred to our office by her PCP  Dr. Chaya Jan to schedule a screening colonoscopy. Her father was diagnosed with advanced colon cancer at the age of 45, he died at the age of 1 from Covid 36.  She denies having any abdominal pain.  Passing normal formed brown bowel movement daily.  No rectal bleeding.  She is taking a prenatal vitamin for general wellbeing and to increase her energy level.  No history of anemia.  No plans for future pregnancy at this time.  No complaints today. She is scheduled to have routine laboratory studies as ordered by her PCP in 03/19/2020.    Past Medical History:  Diagnosis Date  . Gestational diabetes    during pregnancy  . Hypertension    during pregnancy  . IGT (impaired glucose tolerance)    Past Surgical History:  Procedure Laterality Date  . BREAST REDUCTION SURGERY    . CESAREAN SECTION     x 3  . CESAREAN SECTION N/A 04/27/2016   Procedure: CESAREAN SECTION;  Surgeon: Levie Heritage, DO;  Location: [facility] BIRTHING SUITES;  Service: Obstetrics;  Laterality: N/A;    Social History: Married. 2 sons and 1 daughter. Nonsmoker. No alcohol or drug use.  Family History: Father diagnosed with colon cancer age 30.  He died from Covid 46.  Paternal aunt with pancreatic cancer. 3 Sisters (one sister has asthma)  and 2 brothers.   No Known Allergies   Outpatient Encounter Medications as of 01/25/2020  Medication Sig  . Prenatal Vit-Fe Fumarate-FA (PRENATAL VITAMINS PO) Take 1 tablet by mouth daily.   No facility-administered encounter medications on file as of 01/25/2020.     REVIEW OF SYSTEMS: All other systems reviewed and negative except where noted in the History of Present  Illness.   PHYSICAL EXAM: BP 120/80 (BP Location: Left Arm, Patient Position: Sitting, Cuff Size: Normal)   Pulse 76   Temp 98.9 F (37.2 C)   Ht 5\' 3"  (1.6 m) Comment: height measured without shoes  Wt 247 lb (112 kg)   LMP 01/03/2020 (Approximate)   BMI 43.75 kg/m  General: Obese 39 year old female in no acute distress. Head: Normocephalic and atraumatic. Eyes:  Sclerae non-icteric, conjunctive pink. Ears: Normal auditory acuity. Mouth: Dentition intact. No ulcers or lesions.  Neck: Supple, no lymphadenopathy or thyromegaly.  Lungs: Clear bilaterally to auscultation without wheezes, crackles or rhonchi. Heart: Regular rate and rhythm. No murmur, rub or gallop appreciated.  Abdomen: Soft, nontender, non distended. No masses. No hepatosplenomegaly. Normoactive bowel sounds x 4 quadrants.  Rectal: Deferred.  Musculoskeletal: Symmetrical with no gross deformities. Skin: Warm and dry. No rash or lesions on visible extremities. Extremities: No edema. Neurological: Alert oriented x 4, no focal deficits.  Psychological:  Alert and cooperative. Normal mood and affect.  ASSESSMENT AND PLAN:  69. 39 year old female with a positive family history of colon cancer (father diagnosed at the age of 27).  -Colonoscopy benefits and risks discussed including risk with sedation, risk of bleeding, perforation and infection  -To review  her  laboratory results when completed -She will contact her insurance carrier to verify her coverage for a screening colonoscopy at the age of 83 with a  positive family history of colon cancer (father).  -Patient to hold prenatal vitamin for 1 week prior to colonoscopy preparation -Further follow-up to be determined after colonoscopy completed       CC:  Isaac Bliss, [place]*

## 2020-01-29 ENCOUNTER — Encounter: Payer: Self-pay | Admitting: Nurse Practitioner

## 2020-01-29 ENCOUNTER — Telehealth: Payer: Self-pay | Admitting: *Deleted

## 2020-01-29 ENCOUNTER — Ambulatory Visit (INDEPENDENT_AMBULATORY_CARE_PROVIDER_SITE_OTHER): Payer: No Typology Code available for payment source | Admitting: Nurse Practitioner

## 2020-01-29 ENCOUNTER — Ambulatory Visit: Payer: Self-pay | Admitting: Nurse Practitioner

## 2020-01-29 ENCOUNTER — Other Ambulatory Visit: Payer: Self-pay

## 2020-01-29 VITALS — BP 124/78 | Ht 64.5 in | Wt 247.0 lb

## 2020-01-29 DIAGNOSIS — Z13220 Encounter for screening for lipoid disorders: Secondary | ICD-10-CM

## 2020-01-29 DIAGNOSIS — N898 Other specified noninflammatory disorders of vagina: Secondary | ICD-10-CM

## 2020-01-29 DIAGNOSIS — Z113 Encounter for screening for infections with a predominantly sexual mode of transmission: Secondary | ICD-10-CM | POA: Diagnosis not present

## 2020-01-29 DIAGNOSIS — Z01419 Encounter for gynecological examination (general) (routine) without abnormal findings: Secondary | ICD-10-CM | POA: Diagnosis not present

## 2020-01-29 LAB — WET PREP FOR TRICH, YEAST, CLUE

## 2020-01-29 NOTE — Telephone Encounter (Signed)
Office notes faxed to [facility] they will call to schedule. 

## 2020-01-29 NOTE — Progress Notes (Signed)
   Ashley Pugh Jun 08, 1981 [id number]   History:  39 y.o. G5 P3 presents for annual exam without Gyn complaints. Worried about husband's infidelity, requesting an STD screen today. Complains of dizziness that began a couple of months ago. Saw PCP recently but was not fasting for labs. 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 Says she feels like she is unable to completely empty bladder after urination and has leakage at times. Sexually active with husband.   Gynecologic History Patient's last menstrual period was 12/27/2019. Period Cycle (Days): 28 Period Duration (Days): 6 Period Pattern: Regular Menstrual Flow: Heavy Dysmenorrhea: (!) Mild Dysmenorrhea Symptoms: Cramping Contraception: tubal ligation Last Pap: 11/07/2017. Results were: normal Last mammogram: N/A  Past medical history, past surgical history, family history and social history were all reviewed and documented in the EPIC chart. Mother of 3, ages 67, 92 and 21.  ROS:  A ROS was performed and pertinent positives and negatives are included.  Exam:  Vitals:   01/29/20 1123  BP: 124/78  Weight: 247 lb (112 kg)  Height: 5' 4.5" (1.638 m)   Body mass index is 41.74 kg/m.  General appearance:  Normal Thyroid:  Symmetrical, normal in size, without palpable masses or nodularity. Respiratory  Auscultation:  Clear without wheezing or rhonchi Cardiovascular  Auscultation:  Regular rate, without rubs, murmurs or gallops  Edema/varicosities:  Not grossly evident Abdominal  Soft,nontender, without masses, guarding or rebound.  Liver/spleen:  No organomegaly noted  Hernia:  None appreciated  Skin  Inspection:  Grossly normal   Breasts: Examined lying and sitting.   Right: Without masses, retractions, discharge or axillary adenopathy.   Left: Without masses, retractions, discharge or axillary adenopathy. Gentitourinary   Inguinal/mons:  Normal  without inguinal adenopathy  External genitalia:  Normal  BUS/Urethra/Skene's glands:  Normal  Vagina:  Normal  Cervix:  Normal  Uterus:  Retroverted, normal in size, shape and contour.  Midline and mobile  Adnexa/parametria:     Rt: Without masses or tenderness.   Lt: Without masses or tenderness.  Anus and perineum: Normal  Digital rectal exam: Normal sphincter tone without palpated masses or tenderness   Wet prep negative   Assessment/Plan:  39 y.o. MBF G5P3 for annual exam.    Well female exam with routine gynecological exam - Plan: CBC with Differential/Platelet, Comprehensive metabolic panel. Education provided on SBEs, importance of preventative screenings, current guidelines, high calcium diet, regular exercise, and multivitamin daily. Information provided on [facility] to schedule mammogram. Urology referral.   Gestational diabetes - CMP with random glucose  Lipid screening - Plan: Lipid panel  Screen for STD (sexually transmitted disease) - Plan: C. trachomatis/N. gonorrhoeae RNA, HIV Antibody (routine testing w rflx), RPR  Vaginal discharge - Plan: WET PREP FOR TRICH, YEAST, CLUE  Follow up in 1 year for annual    [name] [facility], 11:39 AM 01/29/2020

## 2020-01-29 NOTE — Patient Instructions (Addendum)
Schedule mammogram! [facility] [phone] [address]  [place], [place] [address]  Health Maintenance, Female Adopting a healthy lifestyle and getting preventive care are important in promoting health and wellness. Ask your health care provider about:  The right schedule for you to have regular tests and exams.  Things you can do on your own to prevent diseases and keep yourself healthy. What should I know about diet, weight, and exercise? Eat a healthy diet   Eat a diet that includes plenty of vegetables, fruits, low-fat dairy products, and lean protein.  Do not eat a lot of foods that are high in solid fats, added sugars, or sodium. Maintain a healthy weight Body mass index (BMI) is used to identify weight problems. It estimates body fat based on height and weight. Your health care provider can help determine your BMI and help you achieve or maintain a healthy weight. Get regular exercise Get regular exercise. This is one of the most important things you can do for your health. Most adults should:  Exercise for at least 150 minutes each week. The exercise should increase your heart rate and make you sweat (moderate-intensity exercise).  Do strengthening exercises at least twice a week. This is in addition to the moderate-intensity exercise.  Spend less time sitting. Even light physical activity can be beneficial. Watch cholesterol and blood lipids Have your blood tested for lipids and cholesterol at 41 years of age, then have this test every 5 years. Have your cholesterol levels checked more often if:  Your lipid or cholesterol levels are high.  You are older than 40 years of age.  You are at high risk for heart disease. What should I know about cancer screening? Depending on your health history and family history, you may need to have cancer screening at various ages. This may include screening for:  Breast cancer.  Cervical  cancer.  Colorectal cancer.  Skin cancer.  Lung cancer. What should I know about heart disease, diabetes, and high blood pressure? Blood pressure and heart disease  High blood pressure causes heart disease and increases the risk of stroke. This is more likely to develop in people who have high blood pressure readings, are of African descent, or are overweight.  Have your blood pressure checked: ? Every 3-5 years if you are 18-39 years of age. ? Every year if you are 40 years old or older. Diabetes Have regular diabetes screenings. This checks your fasting blood sugar level. Have the screening done:  Once every three years after age 40 if you are at a normal weight and have a low risk for diabetes.  More often and at a younger age if you are overweight or have a high risk for diabetes. What should I know about preventing infection? Hepatitis B If you have a higher risk for hepatitis B, you should be screened for this virus. Talk with your health care provider to find out if you are at risk for hepatitis B infection. Hepatitis C Testing is recommended for:  Everyone born from 1945 through 1965.  Anyone with known risk factors for hepatitis C. Sexually transmitted infections (STIs)  Get screened for STIs, including gonorrhea and chlamydia, if: ? You are sexually active and are younger than 41 years of age. ? You are older than 41 years of age and your health care provider tells you that you are at risk for this type of infection. ? Your sexual activity has changed since you   were last screened, and you are at increased risk for chlamydia or gonorrhea. Ask your health care provider if you are at risk.  Ask your health care provider about whether you are at high risk for HIV. Your health care provider may recommend a prescription medicine to help prevent HIV infection. If you choose to take medicine to prevent HIV, you should first get tested for HIV. You should then be tested every 3  months for as long as you are taking the medicine. Pregnancy  If you are about to stop having your period (premenopausal) and you may become pregnant, seek counseling before you get pregnant.  Take 400 to 800 micrograms (mcg) of folic acid every day if you become pregnant.  Ask for birth control (contraception) if you want to prevent pregnancy. Osteoporosis and menopause Osteoporosis is a disease in which the bones lose minerals and strength with aging. This can result in bone fractures. If you are 65 years old or older, or if you are at risk for osteoporosis and fractures, ask your health care provider if you should:  Be screened for bone loss.  Take a calcium or vitamin D supplement to lower your risk of fractures.  Be given hormone replacement therapy (HRT) to treat symptoms of menopause. Follow these instructions at home: Lifestyle  Do not use any products that contain nicotine or tobacco, such as cigarettes, e-cigarettes, and chewing tobacco. If you need help quitting, ask your health care provider.  Do not use street drugs.  Do not share needles.  Ask your health care provider for help if you need support or information about quitting drugs. Alcohol use  Do not drink alcohol if: ? Your health care provider tells you not to drink. ? You are pregnant, may be pregnant, or are planning to become pregnant.  If you drink alcohol: ? Limit how much you use to 0-1 drink a day. ? Limit intake if you are breastfeeding.  Be aware of how much alcohol is in your drink. In the [place] one drink equals one 12 oz bottle of beer (355 mL), one 5 oz glass of wine (148 mL), or one 1 oz glass of hard liquor (44 mL). General instructions  Schedule regular health, dental, and eye exams.  Stay current with your vaccines.  Tell your health care provider if: ? You often feel depressed. ? You have ever been abused or do not feel safe at home. Summary  Adopting a healthy lifestyle and getting  preventive care are important in promoting health and wellness.  Follow your health care provider's instructions about healthy diet, exercising, and getting tested or screened for diseases.  Follow your health care provider's instructions on monitoring your cholesterol and blood pressure. This information is not intended to replace advice given to you by your health care provider. Make sure you discuss any questions you have with your health care provider. Document Revised: 08/23/2018 Document Reviewed: 08/23/2018 Elsevier Patient Education  2020 Elsevier Inc.  

## 2020-01-29 NOTE — Telephone Encounter (Signed)
-----   Message from [name], NP sent at 01/29/2020 12:06 PM EDT ----- Patient needs urology referral for worsening urinary retention and incontinence. 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 Thank you

## 2020-01-30 ENCOUNTER — Other Ambulatory Visit: Payer: Self-pay | Admitting: Orthopedic Surgery

## 2020-01-30 ENCOUNTER — Other Ambulatory Visit (HOSPITAL_COMMUNITY): Payer: Self-pay | Admitting: Orthopedic Surgery

## 2020-01-30 ENCOUNTER — Other Ambulatory Visit: Payer: Self-pay | Admitting: Nurse Practitioner

## 2020-01-30 ENCOUNTER — Telehealth: Payer: Self-pay

## 2020-01-30 DIAGNOSIS — R42 Dizziness and giddiness: Secondary | ICD-10-CM

## 2020-01-30 DIAGNOSIS — R2231 Localized swelling, mass and lump, right upper limb: Secondary | ICD-10-CM

## 2020-01-30 DIAGNOSIS — D649 Anemia, unspecified: Secondary | ICD-10-CM

## 2020-01-30 LAB — CBC WITH DIFFERENTIAL/PLATELET
Absolute Monocytes: 496 cells/uL (ref 200–950)
Basophils Absolute: 29 cells/uL (ref 0–200)
Basophils Relative: 0.4 %
Eosinophils Absolute: 51 cells/uL (ref 15–500)
Eosinophils Relative: 0.7 %
HCT: 31.6 % — ABNORMAL LOW (ref 35.0–45.0)
Hemoglobin: 9 g/dL — ABNORMAL LOW (ref 11.7–15.5)
Lymphs Abs: 2540 cells/uL (ref 850–3900)
MCH: 20.5 pg — ABNORMAL LOW (ref 27.0–33.0)
MCHC: 28.5 g/dL — ABNORMAL LOW (ref 32.0–36.0)
MCV: 72.1 fL — ABNORMAL LOW (ref 80.0–100.0)
MPV: 10.5 fL (ref 7.5–12.5)
Monocytes Relative: 6.8 %
Neutro Abs: 4183 cells/uL (ref 1500–7800)
Neutrophils Relative %: 57.3 %
Platelets: 304 10*3/uL (ref 140–400)
RBC: 4.38 10*6/uL (ref 3.80–5.10)
RDW: 17 % — ABNORMAL HIGH (ref 11.0–15.0)
Total Lymphocyte: 34.8 %
WBC: 7.3 10*3/uL (ref 3.8–10.8)

## 2020-01-30 LAB — COMPREHENSIVE METABOLIC PANEL
AG Ratio: 1.1 (calc) (ref 1.0–2.5)
ALT: 15 U/L (ref 6–29)
AST: 14 U/L (ref 10–30)
Albumin: 4 g/dL (ref 3.6–5.1)
Alkaline phosphatase (APISO): 89 U/L (ref 31–125)
BUN: 9 mg/dL (ref 7–25)
CO2: 25 mmol/L (ref 20–32)
Calcium: 9.3 mg/dL (ref 8.6–10.2)
Chloride: 103 mmol/L (ref 98–110)
Creat: 0.72 mg/dL (ref 0.50–1.10)
Globulin: 3.5 g/dL (calc) (ref 1.9–3.7)
Glucose, Bld: 113 mg/dL — ABNORMAL HIGH (ref 65–99)
Potassium: 4.1 mmol/L (ref 3.5–5.3)
Sodium: 135 mmol/L (ref 135–146)
Total Bilirubin: 0.5 mg/dL (ref 0.2–1.2)
Total Protein: 7.5 g/dL (ref 6.1–8.1)

## 2020-01-30 LAB — C. TRACHOMATIS/N. GONORRHOEAE RNA
C. trachomatis RNA, TMA: NOT DETECTED
N. gonorrhoeae RNA, TMA: NOT DETECTED

## 2020-01-30 LAB — LIPID PANEL
Cholesterol: 176 mg/dL (ref <200)
HDL: 44 mg/dL — ABNORMAL LOW (ref >=50)
LDL Cholesterol (Calc): 114 mg/dL (calc) — ABNORMAL HIGH
Non-HDL Cholesterol (Calc): 132 mg/dL (calc) — ABNORMAL HIGH (ref <130)
Total CHOL/HDL Ratio: 4 (calc) (ref <5.0)
Triglycerides: 79 mg/dL (ref <150)

## 2020-01-30 LAB — HIV ANTIBODY (ROUTINE TESTING W REFLEX): HIV 1&2 Ab, 4th Generation: NONREACTIVE

## 2020-01-30 LAB — RPR: RPR Ser Ql: NONREACTIVE

## 2020-01-30 NOTE — Progress Notes (Signed)
Hemoglobin 9.0. Will have patient come in for an early morning draw to check iron panel.

## 2020-01-30 NOTE — Telephone Encounter (Signed)
Patient called back in voice mail. I called her back but got her voice mail and left a message to call me.

## 2020-01-30 NOTE — Telephone Encounter (Signed)
Staff message I received from Tiffany:  " [name], NP  Keenan Bachelor, RMA  Please schedule patient to come in early one morning for an iron panel. Orders have been placed. Thanks "  I called and left message for patient to call me back.

## 2020-02-08 ENCOUNTER — Other Ambulatory Visit: Payer: No Typology Code available for payment source

## 2020-02-08 ENCOUNTER — Other Ambulatory Visit: Payer: Self-pay

## 2020-02-08 DIAGNOSIS — D649 Anemia, unspecified: Secondary | ICD-10-CM

## 2020-02-08 DIAGNOSIS — R42 Dizziness and giddiness: Secondary | ICD-10-CM

## 2020-02-09 LAB — IRON,TIBC AND FERRITIN PANEL
%SAT: 4 % (calc) — ABNORMAL LOW (ref 16–45)
Ferritin: 3 ng/mL — ABNORMAL LOW (ref 16–154)
Iron: 17 ug/dL — ABNORMAL LOW (ref 40–190)
TIBC: 426 mcg/dL (calc) (ref 250–450)

## 2020-02-11 ENCOUNTER — Other Ambulatory Visit: Payer: Self-pay | Admitting: Nurse Practitioner

## 2020-02-11 DIAGNOSIS — D509 Iron deficiency anemia, unspecified: Secondary | ICD-10-CM

## 2020-02-11 MED ORDER — FERROUS SULFATE 325 (65 FE) MG PO TBEC: 325.0000 mg | DELAYED_RELEASE_TABLET | Freq: Three times a day (TID) | ORAL | 3 refills | Status: DC

## 2020-02-11 NOTE — Progress Notes (Signed)
Hgb 9.0, ferritin 3. Recommend Ferrous Sulfate 325 mg TID with meals and follow up with PCP in 3 months for CBC recheck.

## 2020-02-14 NOTE — Telephone Encounter (Signed)
Urology states voicemail is full has called x 2. I called patient and asked her to call urology to schedule, number given to schedule.

## 2020-04-01 ENCOUNTER — Telehealth (INDEPENDENT_AMBULATORY_CARE_PROVIDER_SITE_OTHER): Payer: No Typology Code available for payment source | Admitting: Family Medicine

## 2020-04-01 DIAGNOSIS — Z8616 Personal history of COVID-19: Secondary | ICD-10-CM | POA: Diagnosis not present

## 2020-04-01 DIAGNOSIS — R05 Cough: Secondary | ICD-10-CM

## 2020-04-01 DIAGNOSIS — R059 Cough, unspecified: Secondary | ICD-10-CM

## 2020-04-01 MED ORDER — BENZONATATE 100 MG PO CAPS
100.0000 mg | ORAL_CAPSULE | Freq: Three times a day (TID) | ORAL | 0 refills | Status: DC | PRN
Start: 2020-04-01 — End: 2020-04-16

## 2020-04-01 MED ORDER — DOXYCYCLINE HYCLATE 100 MG PO TABS
100.0000 mg | ORAL_TABLET | Freq: Two times a day (BID) | ORAL | 0 refills | Status: DC
Start: 2020-04-01 — End: 2021-07-22

## 2020-04-01 NOTE — Patient Instructions (Signed)
-  I sent the medication(s) we discussed to your pharmacy:  Meds ordered this encounter  Medications  . doxycycline (VIBRA-TABS) 100 MG tablet    Sig: Take 1 tablet (100 mg total) by mouth 2 (two) times daily.    Dispense:  14 tablet    Refill:  0  . benzonatate (TESSALON PERLES) 100 MG capsule    Sig: Take 1 capsule (100 mg total) by mouth 3 (three) times daily as needed.    Dispense:  20 capsule    Refill:  0   Please let us know if you have any questions or concerns regarding this prescription.  I hope you are feeling better soon! Seek care promptly if your symptoms worsen, new concerns arise or you are not improving with treatment.   

## 2020-04-01 NOTE — Progress Notes (Signed)
Virtual Visit via Video Note  I connected with   on 04/01/20 at 10:20 AM EDT by a video enabled telemedicine application and verified that I am speaking with the correct person using two identifiers.  Location patient: [place] Location provider:work or home office Persons participating in the virtual visit: patient, provider  I discussed the limitations of evaluation and management by telemedicine and the availability of in person appointments. The patient expressed understanding and agreed to proceed.   HPI:  Acute visit for a cough: -she got COVID19 on June 29th -she was not vaccinated, not hospitalized, not on O2 -most symptoms have resolved -but, the cough has persisted, productive at time - white, mild SOB at times -denies fevers, hemoptysis, malaise, wheezing -denies any history of lung disease, immunocompromise   ROS: See pertinent positives and negatives per HPI.  Past Medical History:  Diagnosis Date  . Gestational diabetes    during pregnancy  . Hypertension    during pregnancy  . IGT (impaired glucose tolerance)     Past Surgical History:  Procedure Laterality Date  . BREAST REDUCTION SURGERY    . CESAREAN SECTION     x 3  . CESAREAN SECTION N/A 04/27/2016   Procedure: CESAREAN SECTION;  Surgeon: Levie Heritage, DO;  Location: [facility] BIRTHING SUITES;  Service: Obstetrics;  Laterality: N/A;    Family History  Problem Relation Age of Onset  . Asthma Mother   . Colon cancer Father   . Hypertension Father   . Asthma Sister   . Pancreatic cancer Maternal Aunt   . Diabetes Maternal Aunt   . Hypertension Maternal Aunt     SOCIAL HX: see hpi   Current Outpatient Medications:  .  benzonatate (TESSALON PERLES) 100 MG capsule, Take 1 capsule (100 mg total) by mouth 3 (three) times daily as needed., Disp: 20 capsule, Rfl: 0 .  doxycycline (VIBRA-TABS) 100 MG tablet, Take 1 tablet (100 mg total) by mouth 2 (two) times daily., Disp: 14 tablet, Rfl: 0 .  ferrous sulfate  325 (65 FE) MG EC tablet, Take 1 tablet (325 mg total) by mouth 3 (three) times daily with meals., Disp: 90 tablet, Rfl: 3 .  Prenatal Vit-Fe Fumarate-FA (PRENATAL VITAMINS PO), Take 1 tablet by mouth daily., Disp: , Rfl:   EXAM:  VITALS per patient if applicable:  GENERAL: alert, oriented, appears well and in no acute distress  HEENT: atraumatic, conjunttiva clear, no obvious abnormalities on inspection of external nose and ears  NECK: normal movements of the head and neck  LUNGS: on inspection no signs of respiratory distress, breathing rate appears normal, no obvious gross SOB, gasping or wheezing, wet cough several times throughout the visit - sounds productive  CV: no obvious cyanosis  MS: moves all visible extremities without noticeable abnormality  PSYCH/NEURO: pleasant and cooperative, no obvious depression or anxiety, speech and thought processing grossly intact  ASSESSMENT AND PLAN:  Discussed the following assessment and plan:  Cough  History of 2019 novel coronavirus disease (COVID-19)  -we discussed possible serious and likely etiologies, options for evaluation and workup, limitations of telemedicine visit vs in person visit, treatment, treatment risks and precautions. Pt prefers to treat via telemedicine empirically rather then risking or undertaking an in person visit at this moment. Given productive cough with discolored mucus and duration of symptoms query 2ndary bacterial resp infection vs post viral cough or long haul symptoms vs other. Opted to try trial of doxy 100mg  bid x 7 days + tessalon for  cough. May need eval for other causes, referral to long haul clinic or pulm if persists. Patient agrees to seek prompt in person care if worsening, new symptoms arise, or if is not improving with treatment.   I discussed the assessment and treatment plan with the patient. The patient was provided an opportunity to ask questions and all were answered. The patient agreed with  the plan and demonstrated an understanding of the instructions.   The patient was advised to call back or seek an in-person evaluation if the symptoms worsen or if the condition fails to improve as anticipated.   Terressa Koyanagi, DO

## 2020-04-16 ENCOUNTER — Ambulatory Visit (INDEPENDENT_AMBULATORY_CARE_PROVIDER_SITE_OTHER): Payer: No Typology Code available for payment source | Admitting: Internal Medicine

## 2020-04-16 ENCOUNTER — Encounter: Payer: Self-pay | Admitting: Internal Medicine

## 2020-04-16 ENCOUNTER — Other Ambulatory Visit: Payer: Self-pay

## 2020-04-16 VITALS — BP 110/80 | HR 98 | Temp 98.3°F | Ht 63.0 in | Wt 242.1 lb

## 2020-04-16 DIAGNOSIS — Z0000 Encounter for general adult medical examination without abnormal findings: Secondary | ICD-10-CM | POA: Diagnosis not present

## 2020-04-16 DIAGNOSIS — R05 Cough: Secondary | ICD-10-CM

## 2020-04-16 DIAGNOSIS — R059 Cough, unspecified: Secondary | ICD-10-CM

## 2020-04-16 DIAGNOSIS — R7302 Impaired glucose tolerance (oral): Secondary | ICD-10-CM | POA: Diagnosis not present

## 2020-04-16 DIAGNOSIS — Z1231 Encounter for screening mammogram for malignant neoplasm of breast: Secondary | ICD-10-CM

## 2020-04-16 MED ORDER — BENZONATATE 100 MG PO CAPS: 100.0000 mg | ORAL_CAPSULE | Freq: Two times a day (BID) | ORAL | 0 refills | Status: DC | PRN

## 2020-04-16 NOTE — Addendum Note (Signed)
Addended by: Lerry Liner on: 04/16/2020 10:06 AM   Modules accepted: Orders

## 2020-04-16 NOTE — Progress Notes (Signed)
Established Patient Office Visit     This visit occurred during the SARS-CoV-2 public health emergency.  Safety protocols were in place, including screening questions prior to the visit, additional usage of staff PPE, and extensive cleaning of exam room while observing appropriate contact time as indicated for disinfecting solutions.    CC/Reason for Visit: Annual preventive exam  HPI: Ashley Pugh is a 39 y.o. female who is coming in today for the above mentioned reasons. Past Medical History is significant for: Morbid obesity, impaired glucose tolerance with a A1c of 6.4 in April.  She had COVID-19 virus infection end of June.  She has recovered well but has residual cough, is requesting medication for this.  She also is finishing a course of doxycycline after a recent telemedicine visit.  She saw GYN recently and had a Pap smear, she is due for mammogram and colonoscopy.  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  She has no acute complaints today.  She has been eating healthier and has lost 5 pounds since her last visit.   Past Medical/Surgical History: Past Medical History:  Diagnosis Date  . Gestational diabetes    during pregnancy  . Hypertension    during pregnancy  . IGT (impaired glucose tolerance)     Past Surgical History:  Procedure Laterality Date  . BREAST REDUCTION SURGERY    . CESAREAN SECTION     x 3  . CESAREAN SECTION N/A 04/27/2016   Procedure: CESAREAN SECTION;  Surgeon: Truett Mainland, DO;  Location: [facility];  Service: Obstetrics;  Laterality: N/A;    Social History:  reports that she has never smoked. She has never used smokeless tobacco. She reports current alcohol use. She reports that she does not use drugs.  Allergies: No Known Allergies  Family History:  Family History  Problem Relation Age of Onset  . Asthma Mother   . Colon cancer Father   . Hypertension  Father   . Asthma Sister   . Pancreatic cancer Maternal Aunt   . Diabetes Maternal Aunt   . Hypertension Maternal Aunt      Current Outpatient Medications:  .  benzonatate (TESSALON PERLES) 100 MG capsule, Take 1 capsule (100 mg total) by mouth 3 (three) times daily as needed., Disp: 20 capsule, Rfl: 0 .  doxycycline (VIBRA-TABS) 100 MG tablet, Take 1 tablet (100 mg total) by mouth 2 (two) times daily., Disp: 14 tablet, Rfl: 0 .  ferrous sulfate 325 (65 FE) MG EC tablet, Take 1 tablet (325 mg total) by mouth 3 (three) times daily with meals., Disp: 90 tablet, Rfl: 3 .  Prenatal Vit-Fe Fumarate-FA (PRENATAL VITAMINS PO), Take 1 tablet by mouth daily., Disp: , Rfl:   Review of Systems:  Constitutional: Denies fever, chills, diaphoresis, appetite change and fatigue.  HEENT: Denies photophobia, eye pain, redness, hearing loss, ear pain, congestion, sore throat, rhinorrhea, sneezing, mouth sores, trouble swallowing, neck pain, neck stiffness and tinnitus.   Respiratory: Denies SOB, DOE, chest tightness,  and wheezing.   Cardiovascular: Denies chest pain, palpitations and leg swelling.  Gastrointestinal: Denies nausea, vomiting, abdominal pain, diarrhea, constipation, blood in stool and abdominal distention.  Genitourinary: Denies dysuria, urgency, frequency, hematuria, flank pain and difficulty urinating.  Endocrine: Denies: hot or cold intolerance, sweats, changes in hair or nails, polyuria, polydipsia. Musculoskeletal: Denies myalgias, back pain, joint swelling, arthralgias and gait problem.  Skin: Denies pallor,  rash and wound.  Neurological: Denies dizziness, seizures, syncope, weakness, light-headedness, numbness and headaches.  Hematological: Denies adenopathy. Easy bruising, personal or family bleeding history  Psychiatric/Behavioral: Denies suicidal ideation, mood changes, confusion, nervousness, sleep disturbance and agitation    Physical Exam: Vitals:   04/16/20 0942  BP: 110/80   Pulse: 98  Temp: 98.3 F (36.8 C)  TempSrc: Oral  SpO2: 90%  Weight: 242 lb 1.6 oz (109.8 kg)  Height: _0  (1.6 m)    Body mass index is 42.89 kg/m.   Constitutional: NAD, calm, comfortable, obese Eyes: PERRL, lids and conjunctivae normal ENMT: Mucous membranes are moist. Tympanic membrane is pearly white, no erythema or bulging. Neck: normal, supple, no masses, no thyromegaly Respiratory: clear to auscultation bilaterally, no wheezing, no crackles. Normal respiratory effort. No accessory muscle use.  Cardiovascular: Regular rate and rhythm, no murmurs / rubs / gallops. No extremity edema. 2+ pedal pulses. No carotid bruits.  Abdomen: no tenderness, no masses palpated. No hepatosplenomegaly. Bowel sounds positive.  Musculoskeletal: no clubbing / cyanosis. No joint deformity upper and lower extremities. Good ROM, no contractures. Normal muscle tone.  Skin: no rashes, lesions, ulcers. No induration Neurologic: CN 2-12 grossly intact. Sensation intact, DTR normal. Strength 5/5 in all 4.  Psychiatric: Normal judgment and insight. Alert and oriented x 3. Normal mood.    Impression and Plan:  Encounter for preventive health examination -Advised routine eye and dental care. -She will receive her Covid vaccination in September, otherwise immunizations are up-to-date. -Screening labs today. -Healthy lifestyle discussed in detail. -She will call GI to schedule her colonoscopy due to a family history of colon cancer in her father. -Mammogram requested today. -She had a Pap smear this year with GYN.  Morbid obesity ([facility])  -Discussed healthy lifestyle, including increased physical activity and better food choices to promote weight loss. -She has been congratulated on her success thus far.  IGT (impaired glucose tolerance)  - Plan: Hemoglobin A1c   Patient Instructions  -Nice seeing you today!!  -Lab work today; will notify you once results are available.  -Mammogram will be  requested today.  -Make sure you call to get your colonoscopy scheduled.  -Schedule follow up in 6 months.   Preventive Care 48-79 Years Old, Female Preventive care refers to visits with your health care provider and lifestyle choices that can promote health and wellness. This includes:  A yearly physical exam. This may also be called an annual well check.  Regular dental visits and eye exams.  Immunizations.  Screening for certain conditions.  Healthy lifestyle choices, such as eating a healthy diet, getting regular exercise, not using drugs or products that contain nicotine and tobacco, and limiting alcohol use. What can I expect for my preventive care visit? Physical exam Your health care provider will check your:  Height and weight. This may be used to calculate body mass index (BMI), which tells if you are at a healthy weight.  Heart rate and blood pressure.  Skin for abnormal spots. Counseling Your health care provider may ask you questions about your:  Alcohol, tobacco, and drug use.  Emotional well-being.  Home and relationship well-being.  Sexual activity.  Eating habits.  Work and work Statistician.  Method of birth control.  Menstrual cycle.  Pregnancy history. What immunizations do I need?  Influenza (flu) vaccine  This is recommended every year. Tetanus, diphtheria, and pertussis (Tdap) vaccine  You may need a Td booster every 10 years. Varicella (chickenpox) vaccine  You may  need this if you have not been vaccinated. Zoster (shingles) vaccine  You may need this after age 47. Measles, mumps, and rubella (MMR) vaccine  You may need at least one dose of MMR if you were born in 1957 or later. You may also need a second dose. Pneumococcal conjugate (PCV13) vaccine  You may need this if you have certain conditions and were not previously vaccinated. Pneumococcal polysaccharide (PPSV23) vaccine  You may need one or two doses if you smoke  cigarettes or if you have certain conditions. Meningococcal conjugate (MenACWY) vaccine  You may need this if you have certain conditions. Hepatitis A vaccine  You may need this if you have certain conditions or if you travel or work in places where you may be exposed to hepatitis A. Hepatitis B vaccine  You may need this if you have certain conditions or if you travel or work in places where you may be exposed to hepatitis B. Haemophilus influenzae type b (Hib) vaccine  You may need this if you have certain conditions. Human papillomavirus (HPV) vaccine  If recommended by your health care provider, you may need three doses over 6 months. You may receive vaccines as individual doses or as more than one vaccine together in one shot (combination vaccines). Talk with your health care provider about the risks and benefits of combination vaccines. What tests do I need? Blood tests  Lipid and cholesterol levels. These may be checked every 5 years, or more frequently if you are over 45 years old.  Hepatitis C test.  Hepatitis B test. Screening  Lung cancer screening. You may have this screening every year starting at age 26 if you have a 30-pack-year history of smoking and currently smoke or have quit within the past 15 years.  Colorectal cancer screening. All adults should have this screening starting at age 72 and continuing until age 62. Your health care provider may recommend screening at age 33 if you are at increased risk. You will have tests every 1-10 years, depending on your results and the type of screening test.  Diabetes screening. This is done by checking your blood sugar (glucose) after you have not eaten for a while (fasting). You may have this done every 1-3 years.  Mammogram. This may be done every 1-2 years. Talk with your health care provider about when you should start having regular mammograms. This may depend on whether you have a family history of breast  cancer.  BRCA-related cancer screening. This may be done if you have a family history of breast, ovarian, tubal, or peritoneal cancers.  Pelvic exam and Pap test. This may be done every 3 years starting at age 21. Starting at age 35, this may be done every 5 years if you have a Pap test in combination with an HPV test. Other tests  Sexually transmitted disease (STD) testing.  Bone density scan. This is done to screen for osteoporosis. You may have this scan if you are at high risk for osteoporosis. Follow these instructions at home: Eating and drinking  Eat a diet that includes fresh fruits and vegetables, whole grains, lean protein, and low-fat dairy.  Take vitamin and mineral supplements as recommended by your health care provider.  Do not drink alcohol if: ? Your health care provider tells you not to drink. ? You are pregnant, may be pregnant, or are planning to become pregnant.  If you drink alcohol: ? Limit how much you have to 0-1 drink a day. ?  Be aware of how much alcohol is in your drink. In the [place] one drink equals one 12 oz bottle of beer (355 mL), one 5 oz glass of wine (148 mL), or one 1 oz glass of hard liquor (44 mL). Lifestyle  Take daily care of your teeth and gums.  Stay active. Exercise for at least 30 minutes on 5 or more days each week.  Do not use any products that contain nicotine or tobacco, such as cigarettes, e-cigarettes, and chewing tobacco. If you need help quitting, ask your health care provider.  If you are sexually active, practice safe sex. Use a condom or other form of birth control (contraception) in order to prevent pregnancy and STIs (sexually transmitted infections).  If told by your health care provider, take low-dose aspirin daily starting at age 62. What's next?  Visit your health care provider once a year for a well check visit.  Ask your health care provider how often you should have your eyes and teeth checked.  Stay up to date  on all vaccines. This information is not intended to replace advice given to you by your health care provider. Make sure you discuss any questions you have with your health care provider. Document Revised: 05/11/2018 Document Reviewed: 05/11/2018 Elsevier Patient Education  2020 [place], MD [facility]

## 2020-04-16 NOTE — Addendum Note (Signed)
Addended by: Kern Reap B on: 04/16/2020 10:08 AM   Modules accepted: Orders

## 2020-04-16 NOTE — Patient Instructions (Signed)
-Nice seeing you today!!  -Lab work today; will notify you once results are available.  -Mammogram will be requested today.  -Make sure you call to get your colonoscopy scheduled.  -Schedule follow up in 6 months.   Preventive Care 69-41 Years Old, Female Preventive care refers to visits with your health care provider and lifestyle choices that can promote health and wellness. This includes:  A yearly physical exam. This may also be called an annual well check.  Regular dental visits and eye exams.  Immunizations.  Screening for certain conditions.  Healthy lifestyle choices, such as eating a healthy diet, getting regular exercise, not using drugs or products that contain nicotine and tobacco, and limiting alcohol use. What can I expect for my preventive care visit? Physical exam Your health care provider will check your:  Height and weight. This may be used to calculate body mass index (BMI), which tells if you are at a healthy weight.  Heart rate and blood pressure.  Skin for abnormal spots. Counseling Your health care provider may ask you questions about your:  Alcohol, tobacco, and drug use.  Emotional well-being.  Home and relationship well-being.  Sexual activity.  Eating habits.  Work and work Statistician.  Method of birth control.  Menstrual cycle.  Pregnancy history. What immunizations do I need?  Influenza (flu) vaccine  This is recommended every year. Tetanus, diphtheria, and pertussis (Tdap) vaccine  You may need a Td booster every 10 years. Varicella (chickenpox) vaccine  You may need this if you have not been vaccinated. Zoster (shingles) vaccine  You may need this after age 31. Measles, mumps, and rubella (MMR) vaccine  You may need at least one dose of MMR if you were born in 1957 or later. You may also need a second dose. Pneumococcal conjugate (PCV13) vaccine  You may need this if you have certain conditions and were not  previously vaccinated. Pneumococcal polysaccharide (PPSV23) vaccine  You may need one or two doses if you smoke cigarettes or if you have certain conditions. Meningococcal conjugate (MenACWY) vaccine  You may need this if you have certain conditions. Hepatitis A vaccine  You may need this if you have certain conditions or if you travel or work in places where you may be exposed to hepatitis A. Hepatitis B vaccine  You may need this if you have certain conditions or if you travel or work in places where you may be exposed to hepatitis B. Haemophilus influenzae type b (Hib) vaccine  You may need this if you have certain conditions. Human papillomavirus (HPV) vaccine  If recommended by your health care provider, you may need three doses over 6 months. You may receive vaccines as individual doses or as more than one vaccine together in one shot (combination vaccines). Talk with your health care provider about the risks and benefits of combination vaccines. What tests do I need? Blood tests  Lipid and cholesterol levels. These may be checked every 5 years, or more frequently if you are over 33 years old.  Hepatitis C test.  Hepatitis B test. Screening  Lung cancer screening. You may have this screening every year starting at age 7 if you have a 30-pack-year history of smoking and currently smoke or have quit within the past 15 years.  Colorectal cancer screening. All adults should have this screening starting at age 72 and continuing until age 12. Your health care provider may recommend screening at age 49 if you are at increased risk. You will  have tests every 1-10 years, depending on your results and the type of screening test.  Diabetes screening. This is done by checking your blood sugar (glucose) after you have not eaten for a while (fasting). You may have this done every 1-3 years.  Mammogram. This may be done every 1-2 years. Talk with your health care provider about when you  should start having regular mammograms. This may depend on whether you have a family history of breast cancer.  BRCA-related cancer screening. This may be done if you have a family history of breast, ovarian, tubal, or peritoneal cancers.  Pelvic exam and Pap test. This may be done every 3 years starting at age 21. Starting at age 74, this may be done every 5 years if you have a Pap test in combination with an HPV test. Other tests  Sexually transmitted disease (STD) testing.  Bone density scan. This is done to screen for osteoporosis. You may have this scan if you are at high risk for osteoporosis. Follow these instructions at home: Eating and drinking  Eat a diet that includes fresh fruits and vegetables, whole grains, lean protein, and low-fat dairy.  Take vitamin and mineral supplements as recommended by your health care provider.  Do not drink alcohol if: ? Your health care provider tells you not to drink. ? You are pregnant, may be pregnant, or are planning to become pregnant.  If you drink alcohol: ? Limit how much you have to 0-1 drink a day. ? Be aware of how much alcohol is in your drink. In the [place] one drink equals one 12 oz bottle of beer (355 mL), one 5 oz glass of wine (148 mL), or one 1 oz glass of hard liquor (44 mL). Lifestyle  Take daily care of your teeth and gums.  Stay active. Exercise for at least 30 minutes on 5 or more days each week.  Do not use any products that contain nicotine or tobacco, such as cigarettes, e-cigarettes, and chewing tobacco. If you need help quitting, ask your health care provider.  If you are sexually active, practice safe sex. Use a condom or other form of birth control (contraception) in order to prevent pregnancy and STIs (sexually transmitted infections).  If told by your health care provider, take low-dose aspirin daily starting at age 19. What's next?  Visit your health care provider once a year for a well check  visit.  Ask your health care provider how often you should have your eyes and teeth checked.  Stay up to date on all vaccines. This information is not intended to replace advice given to you by your health care provider. Make sure you discuss any questions you have with your health care provider. Document Revised: 05/11/2018 Document Reviewed: 05/11/2018 Elsevier Patient Education  2020 [employer].

## 2020-04-17 ENCOUNTER — Other Ambulatory Visit: Payer: Self-pay | Admitting: Internal Medicine

## 2020-04-17 ENCOUNTER — Encounter: Payer: Self-pay | Admitting: Internal Medicine

## 2020-04-17 DIAGNOSIS — E559 Vitamin D deficiency, unspecified: Secondary | ICD-10-CM

## 2020-04-17 DIAGNOSIS — IMO0002 Reserved for concepts with insufficient information to code with codable children: Secondary | ICD-10-CM | POA: Insufficient documentation

## 2020-04-17 DIAGNOSIS — E785 Hyperlipidemia, unspecified: Secondary | ICD-10-CM

## 2020-04-17 DIAGNOSIS — D509 Iron deficiency anemia, unspecified: Secondary | ICD-10-CM | POA: Insufficient documentation

## 2020-04-17 DIAGNOSIS — E119 Type 2 diabetes mellitus without complications: Secondary | ICD-10-CM | POA: Insufficient documentation

## 2020-04-17 DIAGNOSIS — E1165 Type 2 diabetes mellitus with hyperglycemia: Secondary | ICD-10-CM

## 2020-04-17 MED ORDER — ATORVASTATIN CALCIUM 40 MG PO TABS: 40.0000 mg | ORAL_TABLET | Freq: Every day | ORAL | 1 refills | Status: DC

## 2020-04-17 MED ORDER — VITAMIN D (ERGOCALCIFEROL) 1.25 MG (50000 UNIT) PO CAPS: 50000.0000 [IU] | ORAL_CAPSULE | ORAL | 0 refills | Status: AC

## 2020-04-17 MED ORDER — METFORMIN HCL 500 MG PO TABS: 500.0000 mg | ORAL_TABLET | Freq: Two times a day (BID) | ORAL | 1 refills | Status: DC

## 2020-04-19 LAB — CBC WITH DIFFERENTIAL/PLATELET
Absolute Monocytes: 421 cells/uL (ref 200–950)
Basophils Absolute: 31 cells/uL (ref 0–200)
Basophils Relative: 0.6 %
Eosinophils Absolute: 52 cells/uL (ref 15–500)
Eosinophils Relative: 1 %
HCT: 29.4 % — ABNORMAL LOW (ref 35.0–45.0)
Hemoglobin: 8.4 g/dL — ABNORMAL LOW (ref 11.7–15.5)
Lymphs Abs: 1768 cells/uL (ref 850–3900)
MCH: 20.9 pg — ABNORMAL LOW (ref 27.0–33.0)
MCHC: 28.6 g/dL — ABNORMAL LOW (ref 32.0–36.0)
MCV: 73.3 fL — ABNORMAL LOW (ref 80.0–100.0)
MPV: 11 fL (ref 7.5–12.5)
Monocytes Relative: 8.1 %
Neutro Abs: 2928 cells/uL (ref 1500–7800)
Neutrophils Relative %: 56.3 %
Platelets: 322 10*3/uL (ref 140–400)
RBC: 4.01 10*6/uL (ref 3.80–5.10)
RDW: 17.5 % — ABNORMAL HIGH (ref 11.0–15.0)
Total Lymphocyte: 34 %
WBC: 5.2 10*3/uL (ref 3.8–10.8)

## 2020-04-19 LAB — TEST AUTHORIZATION

## 2020-04-19 LAB — LIPID PANEL
Cholesterol: 169 mg/dL (ref <200)
HDL: 38 mg/dL — ABNORMAL LOW (ref >=50)
LDL Cholesterol (Calc): 115 mg/dL (calc) — ABNORMAL HIGH
Non-HDL Cholesterol (Calc): 131 mg/dL (calc) — ABNORMAL HIGH (ref <130)
Total CHOL/HDL Ratio: 4.4 (calc) (ref <5.0)
Triglycerides: 70 mg/dL (ref <150)

## 2020-04-19 LAB — COMPREHENSIVE METABOLIC PANEL
AG Ratio: 1.2 (calc) (ref 1.0–2.5)
ALT: 12 U/L (ref 6–29)
AST: 12 U/L (ref 10–30)
Albumin: 4.1 g/dL (ref 3.6–5.1)
Alkaline phosphatase (APISO): 87 U/L (ref 31–125)
BUN: 10 mg/dL (ref 7–25)
CO2: 25 mmol/L (ref 20–32)
Calcium: 9.2 mg/dL (ref 8.6–10.2)
Chloride: 104 mmol/L (ref 98–110)
Creat: 0.67 mg/dL (ref 0.50–1.10)
Globulin: 3.3 g/dL (calc) (ref 1.9–3.7)
Glucose, Bld: 168 mg/dL — ABNORMAL HIGH (ref 65–99)
Potassium: 4.6 mmol/L (ref 3.5–5.3)
Sodium: 137 mmol/L (ref 135–146)
Total Bilirubin: 0.3 mg/dL (ref 0.2–1.2)
Total Protein: 7.4 g/dL (ref 6.1–8.1)

## 2020-04-19 LAB — TSH: TSH: 2.98 mIU/L

## 2020-04-19 LAB — IRON: Iron: 19 ug/dL — ABNORMAL LOW (ref 40–190)

## 2020-04-19 LAB — HEMOGLOBIN A1C
Hgb A1c MFr Bld: 6.7 % of total Hgb — ABNORMAL HIGH (ref <5.7)
Mean Plasma Glucose: 146 (calc)
eAG (mmol/L): 8.1 (calc)

## 2020-04-19 LAB — TRANSFERRIN: Transferrin: 348 mg/dL — ABNORMAL HIGH (ref 188–341)

## 2020-04-19 LAB — VITAMIN B12: Vitamin B-12: 876 pg/mL (ref 200–1100)

## 2020-04-19 LAB — VITAMIN D 25 HYDROXY (VIT D DEFICIENCY, FRACTURES): Vit D, 25-Hydroxy: 14 ng/mL — ABNORMAL LOW (ref 30–100)

## 2020-04-19 LAB — FERRITIN: Ferritin: 5 ng/mL — ABNORMAL LOW (ref 16–154)

## 2020-05-06 ENCOUNTER — Other Ambulatory Visit: Payer: Self-pay

## 2020-05-06 ENCOUNTER — Ambulatory Visit
Admission: RE | Admit: 2020-05-06 | Discharge: 2020-05-06 | Disposition: A | Discharge status: Home / Self Care | Payer: No Typology Code available for payment source | Source: Ambulatory Visit | Attending: Internal Medicine | Admitting: Internal Medicine

## 2020-05-06 DIAGNOSIS — Z1231 Encounter for screening mammogram for malignant neoplasm of breast: Secondary | ICD-10-CM

## 2020-05-23 ENCOUNTER — Telehealth (INDEPENDENT_AMBULATORY_CARE_PROVIDER_SITE_OTHER): Payer: No Typology Code available for payment source | Admitting: Internal Medicine

## 2020-05-23 DIAGNOSIS — R05 Cough: Secondary | ICD-10-CM

## 2020-05-23 DIAGNOSIS — R059 Cough, unspecified: Secondary | ICD-10-CM

## 2020-05-23 NOTE — Progress Notes (Signed)
Virtual Visit via Video Note  I connected with Ashley Pugh on 05/23/20 at  3:30 PM EDT by a video enabled telemedicine application and verified that I am speaking with the correct person using two identifiers.  Location patient: home Location provider: work office Persons participating in the virtual visit: patient, provider  I discussed the limitations of evaluation and management by telemedicine and the availability of in person appointments. The patient expressed understanding and agreed to proceed.   HPI: She has scheduled this visit due to a cough. She had COVID back in June and she still feels a residual cough. No fever or SOB. She has postnasal drip. No other concerning signs/symptoms   ROS: Constitutional: Denies fever, chills, diaphoresis, appetite change and fatigue.  HEENT: Denies photophobia, eye pain, redness, hearing loss, ear pain,  mouth sores, trouble swallowing, neck pain, neck stiffness and tinnitus.   Respiratory: Denies SOB, DOE, cough, chest tightness,  and wheezing.   Cardiovascular: Denies chest pain, palpitations and leg swelling.  Gastrointestinal: Denies nausea, vomiting, abdominal pain, diarrhea, constipation, blood in stool and abdominal distention.  Genitourinary: Denies dysuria, urgency, frequency, hematuria, flank pain and difficulty urinating.  Endocrine: Denies: hot or cold intolerance, sweats, changes in hair or nails, polyuria, polydipsia. Musculoskeletal: Denies myalgias, back pain, joint swelling, arthralgias and gait problem.  Skin: Denies pallor, rash and wound.  Neurological: Denies dizziness, seizures, syncope, weakness, light-headedness, numbness and headaches.  Hematological: Denies adenopathy. Easy bruising, personal or family bleeding history  Psychiatric/Behavioral: Denies suicidal ideation, mood changes, confusion, nervousness, sleep disturbance and agitation   Past Medical History:  Diagnosis Date  . Gestational  diabetes    during pregnancy  . Hypertension    during pregnancy  . IGT (impaired glucose tolerance)     Past Surgical History:  Procedure Laterality Date  . BREAST REDUCTION SURGERY    . CESAREAN SECTION     x 3  . CESAREAN SECTION N/A 04/27/2016   Procedure: CESAREAN SECTION;  Surgeon: Levie Heritage, DO;  Location: [facility];  Service: Obstetrics;  Laterality: N/A;    Family History  Problem Relation Age of Onset  . Asthma Mother   . Colon cancer Father   . Hypertension Father   . Asthma Sister   . Pancreatic cancer Maternal Aunt   . Diabetes Maternal Aunt   . Hypertension Maternal Aunt     SOCIAL HX:   reports that she has never smoked. She has never used smokeless tobacco. She reports current alcohol use. She reports that she does not use drugs.   Current Outpatient Medications:  .  atorvastatin (LIPITOR) 40 MG tablet, Take 1 tablet (40 mg total) by mouth daily., Disp: 90 tablet, Rfl: 1 .  benzonatate (TESSALON) 100 MG capsule, Take 1 capsule (100 mg total) by mouth 2 (two) times daily as needed for cough., Disp: 20 capsule, Rfl: 0 .  doxycycline (VIBRA-TABS) 100 MG tablet, Take 1 tablet (100 mg total) by mouth 2 (two) times daily., Disp: 14 tablet, Rfl: 0 .  ferrous sulfate 325 (65 FE) MG EC tablet, Take 1 tablet (325 mg total) by mouth 3 (three) times daily with meals., Disp: 90 tablet, Rfl: 3 .  metFORMIN (GLUCOPHAGE) 500 MG tablet, Take 1 tablet (500 mg total) by mouth 2 (two) times daily with a meal., Disp: 180 tablet, Rfl: 1 .  Prenatal Vit-Fe Fumarate-FA (PRENATAL VITAMINS PO), Take 1 tablet by mouth daily., Disp: , Rfl:  .  Vitamin D, Ergocalciferol, (DRISDOL)  1.25 MG (50000 UNIT) CAPS capsule, Take 1 capsule (50,000 Units total) by mouth every 7 (seven) days for 12 doses., Disp: 12 capsule, Rfl: 0  EXAM:   VITALS per patient if applicable: none reported  GENERAL: alert, oriented, appears well and in no acute distress  HEENT: atraumatic, conjunttiva  clear, no obvious abnormalities on inspection of external nose and ears  NECK: normal movements of the head and neck  LUNGS: on inspection no signs of respiratory distress, breathing rate appears normal, no obvious gross increased work of breathing, gasping or wheezing  CV: no obvious cyanosis  MS: moves all visible extremities without noticeable abnormality  PSYCH/NEURO: pleasant and cooperative, no obvious depression or anxiety, speech and thought processing grossly intact  ASSESSMENT AND PLAN:   Cough  -Suspect post nasal drip cough. -Advised antihistamines, mucinex. -See no need for CXR at this time given absence of    I discussed the assessment and treatment plan with the patient. The patient was provided an opportunity to ask questions and all were answered. The patient agreed with the plan and demonstrated an understanding of the instructions.   The patient was advised to call back or seek an in-person evaluation if the symptoms worsen or if the condition fails to improve as anticipated.    Chaya Jan, MD  [facility] '

## 2021-07-21 ENCOUNTER — Other Ambulatory Visit: Payer: Self-pay

## 2021-07-22 ENCOUNTER — Encounter: Payer: Self-pay | Admitting: Internal Medicine

## 2021-07-22 ENCOUNTER — Ambulatory Visit (INDEPENDENT_AMBULATORY_CARE_PROVIDER_SITE_OTHER): Payer: Commercial Managed Care - PPO | Admitting: Internal Medicine

## 2021-07-22 VITALS — BP 120/80 | HR 76 | Temp 98.2°F | Wt 250.4 lb

## 2021-07-22 DIAGNOSIS — E785 Hyperlipidemia, unspecified: Secondary | ICD-10-CM | POA: Diagnosis not present

## 2021-07-22 DIAGNOSIS — E1165 Type 2 diabetes mellitus with hyperglycemia: Secondary | ICD-10-CM

## 2021-07-22 DIAGNOSIS — E1169 Type 2 diabetes mellitus with other specified complication: Secondary | ICD-10-CM

## 2021-07-22 DIAGNOSIS — E559 Vitamin D deficiency, unspecified: Secondary | ICD-10-CM

## 2021-07-22 DIAGNOSIS — D509 Iron deficiency anemia, unspecified: Secondary | ICD-10-CM | POA: Diagnosis not present

## 2021-07-22 LAB — LIPID PANEL
Cholesterol: 187 mg/dL (ref 0–200)
HDL: 42.2 mg/dL (ref >39.00)
LDL Cholesterol: 123 mg/dL — ABNORMAL HIGH (ref 0–99)
NonHDL: 144.57
Total CHOL/HDL Ratio: 4
Triglycerides: 109 mg/dL (ref 0.0–149.0)
VLDL: 21.8 mg/dL (ref 0.0–40.0)

## 2021-07-22 LAB — CBC WITH DIFFERENTIAL/PLATELET
Basophils Absolute: 0 10*3/uL (ref 0.0–0.1)
Basophils Relative: 0.2 % (ref 0.0–3.0)
Eosinophils Absolute: 0.1 10*3/uL (ref 0.0–0.7)
Eosinophils Relative: 1.4 % (ref 0.0–5.0)
HCT: 30 % — ABNORMAL LOW (ref 36.0–46.0)
Hemoglobin: 8.8 g/dL — ABNORMAL LOW (ref 12.0–15.0)
Lymphocytes Relative: 36.7 % (ref 12.0–46.0)
Lymphs Abs: 2.1 10*3/uL (ref 0.7–4.0)
MCHC: 29.3 g/dL — ABNORMAL LOW (ref 30.0–36.0)
MCV: 67.5 fl — ABNORMAL LOW (ref 78.0–100.0)
Monocytes Absolute: 0.5 10*3/uL (ref 0.1–1.0)
Monocytes Relative: 8.8 % (ref 3.0–12.0)
Neutro Abs: 3 10*3/uL (ref 1.4–7.7)
Neutrophils Relative %: 52.9 % (ref 43.0–77.0)
Platelets: 273 10*3/uL (ref 150.0–400.0)
RBC: 4.45 Mil/uL (ref 3.87–5.11)
RDW: 19.7 % — ABNORMAL HIGH (ref 11.5–15.5)
WBC: 5.7 10*3/uL (ref 4.0–10.5)

## 2021-07-22 LAB — MICROALBUMIN / CREATININE URINE RATIO
Creatinine,U: 148.5 mg/dL
Microalb Creat Ratio: 2 mg/g (ref 0.0–30.0)
Microalb, Ur: 3 mg/dL — ABNORMAL HIGH (ref 0.0–1.9)

## 2021-07-22 LAB — IBC PANEL
Iron: 22 ug/dL — ABNORMAL LOW (ref 42–145)
Saturation Ratios: 4.3 % — ABNORMAL LOW (ref 20.0–50.0)
TIBC: 511 ug/dL — ABNORMAL HIGH (ref 250.0–450.0)
Transferrin: 365 mg/dL — ABNORMAL HIGH (ref 212.0–360.0)

## 2021-07-22 LAB — COMPREHENSIVE METABOLIC PANEL
ALT: 15 U/L (ref 0–35)
AST: 16 U/L (ref 0–37)
Albumin: 4.3 g/dL (ref 3.5–5.2)
Alkaline Phosphatase: 100 U/L (ref 39–117)
BUN: 8 mg/dL (ref 6–23)
CO2: 27 mEq/L (ref 19–32)
Calcium: 9.6 mg/dL (ref 8.4–10.5)
Chloride: 101 mEq/L (ref 96–112)
Creatinine, Ser: 0.61 mg/dL (ref 0.40–1.20)
GFR: 110.66 mL/min (ref >60.00)
Glucose, Bld: 182 mg/dL — ABNORMAL HIGH (ref 70–99)
Potassium: 4 mEq/L (ref 3.5–5.1)
Sodium: 135 mEq/L (ref 135–145)
Total Bilirubin: 0.4 mg/dL (ref 0.2–1.2)
Total Protein: 8.1 g/dL (ref 6.0–8.3)

## 2021-07-22 LAB — POCT GLYCOSYLATED HEMOGLOBIN (HGB A1C): Hemoglobin A1C: 8.3 % — AB (ref 4.0–5.6)

## 2021-07-22 LAB — FERRITIN: Ferritin: 11 ng/mL (ref 10.0–291.0)

## 2021-07-22 LAB — VITAMIN D 25 HYDROXY (VIT D DEFICIENCY, FRACTURES): VITD: 10.93 ng/mL — ABNORMAL LOW (ref 30.00–100.00)

## 2021-07-22 MED ORDER — ATORVASTATIN CALCIUM 40 MG PO TABS: 40.0000 mg | ORAL_TABLET | Freq: Every day | ORAL | 1 refills | Status: DC

## 2021-07-22 MED ORDER — TRULICITY 0.75 MG/0.5ML ~~LOC~~ SOAJ: 0.7500 mg | SUBCUTANEOUS | 2 refills | Status: DC

## 2021-07-22 NOTE — Addendum Note (Signed)
Addended by: Kandra Nicolas on: 07/22/2021 10:12 AM   Modules accepted: Orders

## 2021-07-22 NOTE — Patient Instructions (Signed)
-  Nice seeing you today!!  -Lab work today; will notify you once results are available.  -Start Trulicity 0.75 mg injected on Wednesdays.  -Start atorvastatin 40 mg at bedtime.  -Schedule follow up in 3 months.

## 2021-07-22 NOTE — Progress Notes (Signed)
Established Patient Office Visit     This visit occurred during the SARS-CoV-2 public health emergency.  Safety protocols were in place, including screening questions prior to the visit, additional usage of staff PPE, and extensive cleaning of exam room while observing appropriate contact time as indicated for disinfecting solutions.    CC/Reason for Visit: Follow-up chronic conditions  HPI: Ashley Pugh is a 40 y.o. female who is coming in today for the above mentioned reasons. Past Medical History is significant for: Morbid obesity, type 2 diabetes, hyperlipidemia, iron deficiency anemia, vitamin D deficiency.  I have not seen her since last year.  At that time she has been newly diagnosed with diabetes and hyperlipidemia, she took metformin and atorvastatin for some time but then decided not to.  She has had her flu vaccine but not her COVID booster.  She is feeling fatigued.   Past Medical/Surgical History: Past Medical History:  Diagnosis Date   Gestational diabetes    during pregnancy   Hypertension    during pregnancy   IGT (impaired glucose tolerance)     Past Surgical History:  Procedure Laterality Date   BREAST REDUCTION SURGERY     CESAREAN SECTION     x 3   CESAREAN SECTION N/A 04/27/2016   Procedure: CESAREAN SECTION;  Surgeon: Levie Heritage, DO;  Location: [facility];  Service: Obstetrics;  Laterality: N/A;    Social History:  reports that she has never smoked. She has never used smokeless tobacco. She reports current alcohol use. She reports that she does not use drugs.  Allergies: No Known Allergies  Family History:  Family History  Problem Relation Age of Onset   Asthma Mother    Colon cancer Father    Hypertension Father    Asthma Sister    Pancreatic cancer Maternal Aunt    Diabetes Maternal Aunt    Hypertension Maternal Aunt      Current Outpatient Medications:    Prenatal Vit-Fe Fumarate-FA (PRENATAL VITAMINS PO),  Take 1 tablet by mouth daily., Disp: , Rfl:    atorvastatin (LIPITOR) 40 MG tablet, Take 1 tablet (40 mg total) by mouth daily., Disp: 90 tablet, Rfl: 1   Dulaglutide (TRULICITY) 0.75 MG/0.5ML SOPN, Inject 0.75 mg into the skin once a week., Disp: 2 mL, Rfl: 2   ferrous sulfate 325 (65 FE) MG EC tablet, Take 1 tablet (325 mg total) by mouth 3 (three) times daily with meals. (Patient not taking: Reported on 07/22/2021), Disp: 90 tablet, Rfl: 3  Review of Systems:  Constitutional: Denies fever, chills, diaphoresis, appetite change. HEENT: Denies photophobia, eye pain, redness, hearing loss, ear pain, congestion, sore throat, rhinorrhea, sneezing, mouth sores, trouble swallowing, neck pain, neck stiffness and tinnitus.   Respiratory: Denies SOB, DOE, cough, chest tightness,  and wheezing.   Cardiovascular: Denies chest pain, palpitations and leg swelling.  Gastrointestinal: Denies nausea, vomiting, abdominal pain, diarrhea, constipation, blood in stool and abdominal distention.  Genitourinary: Denies dysuria, urgency, frequency, hematuria, flank pain and difficulty urinating.  Endocrine: Denies: hot or cold intolerance, sweats, changes in hair or nails, polyuria, polydipsia. Musculoskeletal: Denies myalgias, back pain, joint swelling, arthralgias and gait problem.  Skin: Denies pallor, rash and wound.  Neurological: Denies dizziness, seizures, syncope, weakness, light-headedness, numbness and headaches.  Hematological: Denies adenopathy. Easy bruising, personal or family bleeding history  Psychiatric/Behavioral: Denies suicidal ideation, mood changes, confusion, nervousness, sleep disturbance and agitation    Physical Exam: Vitals:   07/22/21 3785  BP: 120/80  Pulse: 76  Temp: 98.2 F (36.8 C)  TempSrc: Oral  SpO2: 98%  Weight: 250 lb 6.4 oz (113.6 kg)    Body mass index is 44.36 kg/m.   Constitutional: NAD, calm, comfortable Eyes: PERRL, lids and conjunctivae normal ENMT: Mucous  membranes are moist.  Respiratory: clear to auscultation bilaterally, no wheezing, no crackles. Normal respiratory effort. No accessory muscle use.  Cardiovascular: Regular rate and rhythm, no murmurs / rubs / gallops. No extremity edema.  Neurologic: Grossly intact and nonfocal Psychiatric: Normal judgment and insight. Alert and oriented x 3. Normal mood.    Impression and Plan:  Uncontrolled type 2 diabetes mellitus with hyperglycemia ([place])  - Plan: POCT glycosylated hemoglobin (Hb A1C), Dulaglutide (TRULICITY) A999333 0000000 SOPN, Amb ref to Medical Nutrition Therapy-MNT, Microalbumin / creatinine urine ratio, Comprehensive metabolic panel -Well-controlled with an A1c of 8.3 in office today. -Start Trulicity, refer to dietitian. -Follow-up in 3 months.  Morbid obesity ([place]) -Discussed healthy lifestyle, including increased physical activity and better food choices to promote weight loss. -She has been referred to dietitian, she has also been started on Trulicity which hopefully will have some effect on weight loss as well.  Hyperlipidemia associated with type 2 diabetes mellitus ([place])  - Plan: atorvastatin (LIPITOR) 40 MG tablet, Lipid panel -Goal LDL less than 70.  Vitamin D deficiency  - Plan: VITAMIN D 25 Hydroxy (Vit-D Deficiency, Fractures)  Iron deficiency anemia, unspecified iron deficiency anemia type  - Plan: CBC with Differential/Platelet, IBC Panel([place]), Ferritin  Time spent: 33 minutes reviewing chart, interviewing and examining patient and formulating plan of care.   Patient Instructions  -Nice seeing you today!!  -Lab work today; will notify you once results are available.  -Start Trulicity [id number] mg injected on Wednesdays.  -Start atorvastatin 40 mg at bedtime.  -Schedule follow up in 3 months.    Lelon Frohlich, MD [place] [facility]

## 2021-07-23 ENCOUNTER — Encounter: Payer: Self-pay | Admitting: Internal Medicine

## 2021-07-23 ENCOUNTER — Other Ambulatory Visit: Payer: Self-pay | Admitting: Internal Medicine

## 2021-07-23 DIAGNOSIS — R809 Proteinuria, unspecified: Secondary | ICD-10-CM

## 2021-07-23 DIAGNOSIS — E559 Vitamin D deficiency, unspecified: Secondary | ICD-10-CM

## 2021-07-23 DIAGNOSIS — E1129 Type 2 diabetes mellitus with other diabetic kidney complication: Secondary | ICD-10-CM

## 2021-07-23 MED ORDER — VITAMIN D (ERGOCALCIFEROL) 1.25 MG (50000 UNIT) PO CAPS: 50000.0000 [IU] | ORAL_CAPSULE | ORAL | 0 refills | Status: AC

## 2021-07-23 MED ORDER — LISINOPRIL 10 MG PO TABS: 10.0000 mg | ORAL_TABLET | Freq: Every day | ORAL | 1 refills | Status: DC

## 2021-07-24 ENCOUNTER — Telehealth: Payer: Self-pay | Admitting: Internal Medicine

## 2021-07-24 ENCOUNTER — Telehealth: Payer: Self-pay | Admitting: *Deleted

## 2021-07-24 ENCOUNTER — Other Ambulatory Visit: Payer: Self-pay | Admitting: Internal Medicine

## 2021-07-24 DIAGNOSIS — D509 Iron deficiency anemia, unspecified: Secondary | ICD-10-CM

## 2021-07-24 DIAGNOSIS — E1169 Type 2 diabetes mellitus with other specified complication: Secondary | ICD-10-CM

## 2021-07-24 DIAGNOSIS — E785 Hyperlipidemia, unspecified: Secondary | ICD-10-CM

## 2021-07-24 NOTE — Telephone Encounter (Signed)
PA started for Trulicity Key: [id number]

## 2021-07-24 NOTE — Telephone Encounter (Signed)
Attempted to call patient Appointment scheduled for iron infusion  Patient Care 07/30/21 9 am. [address] [place], [address]. [phone]. Patient can call Patient Care if she would like to reschedule.

## 2021-07-24 NOTE — Telephone Encounter (Signed)
Attempted to call the patient, but the mailbox is full.  See lab result note.

## 2021-07-24 NOTE — Telephone Encounter (Signed)
Patient called back to follow up on a missed call from [place]. I let patient know that she was unavailable right now and would give a call back. Patient stated to leave a message as she is at work and she would call back    Good callback number is [phone]    Please advise

## 2021-07-29 NOTE — Telephone Encounter (Signed)
Patient is aware and information given if rescheduling is needed.

## 2021-07-30 ENCOUNTER — Non-Acute Institutional Stay (HOSPITAL_COMMUNITY)
Admission: RE | Admit: 2021-07-30 | Discharge: 2021-07-30 | Disposition: A | Discharge status: Home / Self Care | Payer: Commercial Managed Care - PPO | Source: Ambulatory Visit | Attending: Internal Medicine | Admitting: Internal Medicine

## 2021-07-30 ENCOUNTER — Other Ambulatory Visit: Payer: Self-pay

## 2021-07-30 DIAGNOSIS — D649 Anemia, unspecified: Secondary | ICD-10-CM | POA: Diagnosis present

## 2021-07-30 MED ORDER — SODIUM CHLORIDE 0.9 % IV SOLN: INTRAVENOUS | Status: DC | PRN

## 2021-07-30 MED ORDER — SODIUM CHLORIDE 0.9 % IV SOLN: 510.0000 mg | Freq: Once | INTRAVENOUS | 0 refills | Status: AC

## 2021-07-30 MED ORDER — SODIUM CHLORIDE 0.9 % IV SOLN
510.0000 mg | Freq: Once | INTRAVENOUS | Status: AC
  Administered 2021-07-30: 10:00:00 510 mg via INTRAVENOUS
  Filled 2021-07-30: qty 510

## 2021-07-30 NOTE — Progress Notes (Signed)
PATIENT CARE CENTER NOTE   Diagnosis: Low ferritin, mild anemia, anemia not corrected with oral iron- D64.9   Provider: Philip Aspen, Minerva Ends, MD   Procedure: Feraheme infusion    Note:  Patient received Feraheme 510 mg infusion (dose 1 of 1) via PIV. Tolerated infusion well with no adverse reaction. Observed patient for 30 minutes post infusion. Vital signs stable. Discharge instructions given. Patient alert, oriented and ambulatory at discharge.

## 2021-08-14 NOTE — Telephone Encounter (Signed)
Approved 06/24/21 - 07/24/22

## 2021-09-21 ENCOUNTER — Telehealth: Payer: Self-pay | Admitting: Internal Medicine

## 2021-09-21 NOTE — Telephone Encounter (Signed)
Patient calling in with respiratory symptoms: Shortness of breath, chest pain, palpitations or other red words send to Triage  Does the patient have a fever over 100, cough, congestion, sore throat, runny nose, lost of taste/smell (please list symptoms that patient has)? States only  a cough   What date did symptoms start? States "about a month ago" (If over 5 days ago, pt may be scheduled for in person visit)  Have you tested for Covid in the last 5 days? No   If yes, was it positive OR negative  If positive in the last 5 days, please schedule virtual visit now. If negative, schedule for an in person OV with the next available provider if PCP has no openings. Please also let patient know they will be tested again (follow the script below)  "you will have to arrive prior to your appt time to be Covid tested. Please park in back of office at the cone & call [phone] to let the staff know you have arrived. A staff member will meet you at your car to do a rapid covid test. Once the test has resulted you will be notified by phone of your results to determine if appt will remain an in person visit or be converted to a virtual/phone visit. If you arrive less than before your appt time, your visit will be automatically converted to virtual & any recommended testing will happen AFTER the visit."   THINGS TO REMEMBER  If no availability for virtual visit in office,  please schedule another [place] office  If no availability at another [place] office, please instruct patient that they can schedule an evisit or virtual visit through their mychart account. Visits up to 8pm  patients can be seen in office 5 days after positive COVID test

## 2021-09-24 ENCOUNTER — Ambulatory Visit (INDEPENDENT_AMBULATORY_CARE_PROVIDER_SITE_OTHER): Payer: Self-pay | Admitting: Internal Medicine

## 2021-09-24 ENCOUNTER — Encounter: Payer: Self-pay | Admitting: Internal Medicine

## 2021-09-24 VITALS — BP 128/80 | HR 113 | Temp 98.2°F | Ht 63.0 in | Wt 251.2 lb

## 2021-09-24 DIAGNOSIS — R058 Other specified cough: Secondary | ICD-10-CM

## 2021-09-24 DIAGNOSIS — T464X5A Adverse effect of angiotensin-converting-enzyme inhibitors, initial encounter: Secondary | ICD-10-CM

## 2021-09-24 DIAGNOSIS — D509 Iron deficiency anemia, unspecified: Secondary | ICD-10-CM

## 2021-09-24 DIAGNOSIS — R809 Proteinuria, unspecified: Secondary | ICD-10-CM

## 2021-09-24 DIAGNOSIS — E1129 Type 2 diabetes mellitus with other diabetic kidney complication: Secondary | ICD-10-CM

## 2021-09-24 MED ORDER — FERROUS SULFATE 325 (65 FE) MG PO TBEC: 325.0000 mg | DELAYED_RELEASE_TABLET | Freq: Every day | ORAL | 3 refills | Status: AC

## 2021-09-24 MED ORDER — VALSARTAN 80 MG PO TABS: 80.0000 mg | ORAL_TABLET | Freq: Every day | ORAL | 1 refills | Status: DC

## 2021-09-24 NOTE — Progress Notes (Signed)
Acute office Visit     This visit occurred during the SARS-CoV-2 public health emergency.  Safety protocols were in place, including screening questions prior to the visit, additional usage of staff PPE, and extensive cleaning of exam room while observing appropriate contact time as indicated for disinfecting solutions.    CC/Reason for Visit: Cough  HPI: Ashley Pugh is a 41 y.o. female who is coming in today for the above mentioned reasons. Past Medical History is significant for: Morbid obesity, type 2 diabetes with microalbuminuria, hyperlipidemia, vitamin D deficiency and iron deficiency anemia.  In November she was started on lisinopril due to microalbuminuria.  Ever since then she has noticed a dry, persistent cough.  She already has an appointment scheduled in February to discuss her chronic conditions.  Past Medical/Surgical History: Past Medical History:  Diagnosis Date   Gestational diabetes    during pregnancy   Hypertension    during pregnancy   IGT (impaired glucose tolerance)     Past Surgical History:  Procedure Laterality Date   BREAST REDUCTION SURGERY     CESAREAN SECTION     x 3   CESAREAN SECTION N/A 04/27/2016   Procedure: CESAREAN SECTION;  Surgeon: Truett Mainland, DO;  Location: [place];  Service: Obstetrics;  Laterality: N/A;    Social History:  reports that she has never smoked. She has never used smokeless tobacco. She reports current alcohol use. She reports that she does not use drugs.  Allergies: Allergies  Allergen Reactions   Lisinopril Cough    Family History:  Family History  Problem Relation Age of Onset   Asthma Mother    Colon cancer Father    Hypertension Father    Asthma Sister    Pancreatic cancer Maternal Aunt    Diabetes Maternal Aunt    Hypertension Maternal Aunt      Current Outpatient Medications:    atorvastatin (LIPITOR) 40 MG tablet, Take 1 tablet (40 mg total) by mouth daily., Disp:  90 tablet, Rfl: 1   Dulaglutide (TRULICITY) A999333 0000000 SOPN, Inject 0.75 mg into the skin once a week., Disp: 2 mL, Rfl: 2   Prenatal Vit-Fe Fumarate-FA (PRENATAL VITAMINS PO), Take 1 tablet by mouth daily., Disp: , Rfl:    valsartan (DIOVAN) 80 MG tablet, Take 1 tablet (80 mg total) by mouth daily., Disp: 90 tablet, Rfl: 1   Vitamin D, Ergocalciferol, (DRISDOL) 1.25 MG (50000 UNIT) CAPS capsule, Take 1 capsule (50,000 Units total) by mouth every 7 (seven) days for 12 doses., Disp: 12 capsule, Rfl: 0   ferrous sulfate 325 (65 FE) MG EC tablet, Take 1 tablet (325 mg total) by mouth daily with breakfast., Disp: 90 tablet, Rfl: 3  Review of Systems:  Constitutional: Denies fever, chills, diaphoresis, appetite change and fatigue.  HEENT: Denies photophobia, eye pain, redness, hearing loss, ear pain, congestion, sore throat, rhinorrhea, sneezing, mouth sores, trouble swallowing, neck pain, neck stiffness and tinnitus.   Respiratory: Denies SOB, DOE,  chest tightness,  and wheezing.   Cardiovascular: Denies chest pain, palpitations and leg swelling.  Gastrointestinal: Denies nausea, vomiting, abdominal pain, diarrhea, constipation, blood in stool and abdominal distention.  Genitourinary: Denies dysuria, urgency, frequency, hematuria, flank pain and difficulty urinating.  Endocrine: Denies: hot or cold intolerance, sweats, changes in hair or nails, polyuria, polydipsia. Musculoskeletal: Denies myalgias, back pain, joint swelling, arthralgias and gait problem.  Skin: Denies pallor, rash and wound.  Neurological: Denies dizziness, seizures, syncope, weakness, light-headedness, numbness  and headaches.  Hematological: Denies adenopathy. Easy bruising, personal or family bleeding history  Psychiatric/Behavioral: Denies suicidal ideation, mood changes, confusion, nervousness, sleep disturbance and agitation    Physical Exam: Vitals:   09/24/21 1543  BP: 128/80  Pulse: (!) 113  Temp: 98.2 F (36.8  C)  TempSrc: Oral  SpO2: 97%  Weight: 251 lb 3 oz (113.9 kg)  Height: 5\' 3"  (1.6 m)    Body mass index is 44.5 kg/m.   Constitutional: NAD, calm, comfortable Eyes: PERRL, lids and conjunctivae normal ENMT: Mucous membranes are moist.  Respiratory: clear to auscultation bilaterally, no wheezing, no crackles. Normal respiratory effort. No accessory muscle use.  Cardiovascular: Regular rate and rhythm, no murmurs / rubs / gallops. No extremity edema.  Psychiatric: Normal judgment and insight. Alert and oriented x 3. Normal mood.    Impression and Plan:  Cough due to ACE inhibitor -She will be taken off lisinopril, marked as an allergy and placed on valsartan instead.  Microalbuminuria due to type 2 diabetes mellitus ([place])  - Plan: valsartan (DIOVAN) 80 MG tablet  Iron deficiency anemia, unspecified iron deficiency anemia type  - Plan: ferrous sulfate 325 (65 FE) MG EC tablet -She did receive an iron infusion a few months back.  Time spent: 21 minutes reviewing chart, interviewing and examining patient and formulating plan of care.   Patient Instructions  -Nice seeing you today!!  -STOP lisinopril.  -Start valsartan 80 mg daily.  -See you back next month as scheduled.    Lelon Frohlich, MD [place] [facility] at [facility]

## 2021-09-24 NOTE — Patient Instructions (Signed)
-  Nice seeing you today!!  -STOP lisinopril.  -Start valsartan 80 mg daily.  -See you back next month as scheduled.

## 2021-10-14 ENCOUNTER — Ambulatory Visit (INDEPENDENT_AMBULATORY_CARE_PROVIDER_SITE_OTHER): Payer: BC Managed Care – PPO | Admitting: Internal Medicine

## 2021-10-14 ENCOUNTER — Encounter: Payer: Self-pay | Admitting: Internal Medicine

## 2021-10-14 VITALS — BP 130/80 | HR 94 | Temp 98.4°F | Wt 249.1 lb

## 2021-10-14 DIAGNOSIS — E559 Vitamin D deficiency, unspecified: Secondary | ICD-10-CM

## 2021-10-14 DIAGNOSIS — R809 Proteinuria, unspecified: Secondary | ICD-10-CM

## 2021-10-14 DIAGNOSIS — E785 Hyperlipidemia, unspecified: Secondary | ICD-10-CM | POA: Diagnosis not present

## 2021-10-14 DIAGNOSIS — D509 Iron deficiency anemia, unspecified: Secondary | ICD-10-CM

## 2021-10-14 DIAGNOSIS — E1129 Type 2 diabetes mellitus with other diabetic kidney complication: Secondary | ICD-10-CM | POA: Diagnosis not present

## 2021-10-14 DIAGNOSIS — E1165 Type 2 diabetes mellitus with hyperglycemia: Secondary | ICD-10-CM | POA: Diagnosis not present

## 2021-10-14 DIAGNOSIS — E1169 Type 2 diabetes mellitus with other specified complication: Secondary | ICD-10-CM | POA: Diagnosis not present

## 2021-10-14 LAB — POCT GLYCOSYLATED HEMOGLOBIN (HGB A1C): Hemoglobin A1C: 6.8 % — AB (ref 4.0–5.6)

## 2021-10-14 LAB — LIPID PANEL
Cholesterol: 120 mg/dL (ref 0–200)
HDL: 37.1 mg/dL — ABNORMAL LOW (ref >39.00)
LDL Cholesterol: 65 mg/dL (ref 0–99)
NonHDL: 83.22
Total CHOL/HDL Ratio: 3
Triglycerides: 91 mg/dL (ref 0.0–149.0)
VLDL: 18.2 mg/dL (ref 0.0–40.0)

## 2021-10-14 LAB — CBC WITH DIFFERENTIAL/PLATELET
Basophils Absolute: 0 10*3/uL (ref 0.0–0.1)
Basophils Relative: 0.3 % (ref 0.0–3.0)
Eosinophils Absolute: 0.1 10*3/uL (ref 0.0–0.7)
Eosinophils Relative: 1.4 % (ref 0.0–5.0)
HCT: 31 % — ABNORMAL LOW (ref 36.0–46.0)
Hemoglobin: 9.8 g/dL — ABNORMAL LOW (ref 12.0–15.0)
Lymphocytes Relative: 26.8 % (ref 12.0–46.0)
Lymphs Abs: 1.7 10*3/uL (ref 0.7–4.0)
MCHC: 31.7 g/dL (ref 30.0–36.0)
MCV: 76.3 fl — ABNORMAL LOW (ref 78.0–100.0)
Monocytes Absolute: 0.4 10*3/uL (ref 0.1–1.0)
Monocytes Relative: 6.4 % (ref 3.0–12.0)
Neutro Abs: 4.2 10*3/uL (ref 1.4–7.7)
Neutrophils Relative %: 65.1 % (ref 43.0–77.0)
Platelets: 326 10*3/uL (ref 150.0–400.0)
RBC: 4.06 Mil/uL (ref 3.87–5.11)
RDW: 20.6 % — ABNORMAL HIGH (ref 11.5–15.5)
WBC: 6.4 10*3/uL (ref 4.0–10.5)

## 2021-10-14 LAB — VITAMIN D 25 HYDROXY (VIT D DEFICIENCY, FRACTURES): VITD: 20.17 ng/mL — ABNORMAL LOW (ref 30.00–100.00)

## 2021-10-14 MED ORDER — VALSARTAN 80 MG PO TABS: 80.0000 mg | ORAL_TABLET | Freq: Every day | ORAL | 1 refills | Status: DC

## 2021-10-14 MED ORDER — TRULICITY 1.5 MG/0.5ML ~~LOC~~ SOAJ: 1.5000 mg | SUBCUTANEOUS | 0 refills | Status: DC

## 2021-10-14 NOTE — Patient Instructions (Signed)
-  Nice seeing you today!!  -Lab work today; will notify you once results are available.  -Increase trulicity to 1.5 mg weekly.  -Make sure you are taking iron tablets twice a day. Take miralax daily to avoid constipation.  -Schedule follow up in 3 months.

## 2021-10-14 NOTE — Progress Notes (Signed)
Established Patient Office Visit     This visit occurred during the SARS-CoV-2 public health emergency.  Safety protocols were in place, including screening questions prior to the visit, additional usage of staff PPE, and extensive cleaning of exam room while observing appropriate contact time as indicated for disinfecting solutions.    CC/Reason for Visit: 21-month follow-up chronic conditions  HPI: Ashley Pugh is a 41 y.o. female who is coming in today for the above mentioned reasons. Past Medical History is significant for: Type 2 diabetes, morbid obesity, hyperlipidemia, vitamin D deficiency and severe iron deficiency anemia.  Since last visit she was started on dulaglutide 0.75 mg weekly and is tolerating it well.  She also received an iron infusion for severe iron deficiency anemia, she is now taking daily iron tablets but is complaining of significant constipation.  She was taken off lisinopril due to cough and placed on valsartan.  She is doing this due to significant microalbuminuria associated with her diabetes.  She was also started on atorvastatin 40 mg daily due to an LDL of 123.  She is doing well and has no acute concerns today.   Past Medical/Surgical History: Past Medical History:  Diagnosis Date   Gestational diabetes    during pregnancy   Hypertension    during pregnancy   IGT (impaired glucose tolerance)     Past Surgical History:  Procedure Laterality Date   BREAST REDUCTION SURGERY     CESAREAN SECTION     x 3   CESAREAN SECTION N/A 04/27/2016   Procedure: CESAREAN SECTION;  Surgeon: Truett Mainland, DO;  Location: [place];  Service: Obstetrics;  Laterality: N/A;    Social History:  reports that she has never smoked. She has never used smokeless tobacco. She reports current alcohol use. She reports that she does not use drugs.  Allergies: Allergies  Allergen Reactions   Lisinopril Cough    Family History:  Family History   Problem Relation Age of Onset   Asthma Mother    Colon cancer Father    Hypertension Father    Asthma Sister    Pancreatic cancer Maternal Aunt    Diabetes Maternal Aunt    Hypertension Maternal Aunt      Current Outpatient Medications:    Dulaglutide (TRULICITY) 1.5 0000000 SOPN, Inject 1.5 mg into the skin once a week., Disp: 6 mL, Rfl: 0   atorvastatin (LIPITOR) 40 MG tablet, Take 1 tablet (40 mg total) by mouth daily., Disp: 90 tablet, Rfl: 1   ferrous sulfate 325 (65 FE) MG EC tablet, Take 1 tablet (325 mg total) by mouth daily with breakfast., Disp: 90 tablet, Rfl: 3   Prenatal Vit-Fe Fumarate-FA (PRENATAL VITAMINS PO), Take 1 tablet by mouth daily., Disp: , Rfl:    valsartan (DIOVAN) 80 MG tablet, Take 1 tablet (80 mg total) by mouth daily., Disp: 90 tablet, Rfl: 1  Review of Systems:  Constitutional: Denies fever, chills, diaphoresis, appetite change and fatigue.  HEENT: Denies photophobia, eye pain, redness, hearing loss, ear pain, congestion, sore throat, rhinorrhea, sneezing, mouth sores, trouble swallowing, neck pain, neck stiffness and tinnitus.   Respiratory: Denies SOB, DOE, cough, chest tightness,  and wheezing.   Cardiovascular: Denies chest pain, palpitations and leg swelling.  Gastrointestinal: Denies nausea, vomiting, abdominal pain, diarrhea,blood in stool and abdominal distention.  Genitourinary: Denies dysuria, urgency, frequency, hematuria, flank pain and difficulty urinating.  Endocrine: Denies: hot or cold intolerance, sweats, changes in hair or nails,  polyuria, polydipsia. Musculoskeletal: Denies myalgias, back pain, joint swelling, arthralgias and gait problem.  Skin: Denies pallor, rash and wound.  Neurological: Denies dizziness, seizures, syncope, weakness, light-headedness, numbness and headaches.  Hematological: Denies adenopathy. Easy bruising, personal or family bleeding history  Psychiatric/Behavioral: Denies suicidal ideation, mood changes,  confusion, nervousness, sleep disturbance and agitation    Physical Exam: Vitals:   10/14/21 1013  BP: 130/80  Pulse: 94  Temp: 98.4 F (36.9 C)  TempSrc: Oral  SpO2: 99%  Weight: 249 lb 1.6 oz (113 kg)    Body mass index is 44.13 kg/m.   Constitutional: NAD, calm, comfortable Eyes: PERRL, lids and conjunctivae normal ENMT: Mucous membranes are moist.  Respiratory: clear to auscultation bilaterally, no wheezing, no crackles. Normal respiratory effort. No accessory muscle use.  Cardiovascular: Regular rate and rhythm, no murmurs / rubs / gallops. No extremity edema.  Neurologic: Grossly intact and nonfocal Psychiatric: Normal judgment and insight. Alert and oriented x 3. Normal mood.    Impression and Plan:  Uncontrolled type 2 diabetes mellitus with hyperglycemia ([place])  - Plan: POCT glycosylated hemoglobin (Hb A1C), CBC with Differential/Platelet, Dulaglutide (TRULICITY) 1.5 0000000 SOPN -In office A1c today shows significant improvement down to 6.8 from 8.3 in November. -I will increase dulaglutide to 1.5 mg, she is also taking valsartan due to microalbuminuria.  Microalbuminuria due to type 2 diabetes mellitus ([place])  - Plan: valsartan (DIOVAN) 80 MG tablet  Morbid obesity ([place]) -Discussed healthy lifestyle, including increased physical activity and better food choices to promote weight loss.  Hyperlipidemia associated with type 2 diabetes mellitus ([place])  - Plan: Lipid panel -Goal LDL less than 70, currently on atorvastatin 40 mg daily.  Iron deficiency anemia, unspecified iron deficiency anemia type -She received a Feraheme infusion in November, is not taking iron tablets.  Have advised daily use of MiraLAX to contend with constipation related to iron.  Vitamin D deficiency  - Plan: VITAMIN D 25 Hydroxy (Vit-D Deficiency, Fractures)  Time spent: 32 minutes reviewing chart, interviewing and examining patient and formulating plan of care.   Patient Instructions   -Nice seeing you today!!  -Lab work today; will notify you once results are available.  -Increase trulicity to 1.5 mg weekly.  -Make sure you are taking iron tablets twice a day. Take miralax daily to avoid constipation.  -Schedule follow up in 3 months.      Lelon Frohlich, MD [place] [facility] at [facility]

## 2021-10-15 ENCOUNTER — Other Ambulatory Visit: Payer: Self-pay | Admitting: Internal Medicine

## 2021-10-15 DIAGNOSIS — E559 Vitamin D deficiency, unspecified: Secondary | ICD-10-CM

## 2021-10-15 MED ORDER — VITAMIN D (ERGOCALCIFEROL) 1.25 MG (50000 UNIT) PO CAPS: 50000.0000 [IU] | ORAL_CAPSULE | ORAL | 0 refills | Status: AC

## 2021-11-11 ENCOUNTER — Ambulatory Visit: Payer: BC Managed Care – PPO | Admitting: Nurse Practitioner

## 2021-11-11 NOTE — Progress Notes (Deleted)
? ?  Ashley Pugh 31-Aug-1981 [id number] ? ? ?History:  41 y.o. G5 P3 presents for annual exam without GYN complaints. Heavy menses. Normal pap history. 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 Says she feels like she is unable to completely empty bladder after urination and has leakage at times. T2DM, HLD, anemia, vitamin D deficiency managed by PCP.  ? ?Gynecologic History ?No LMP recorded. ?  ?Contraception: tubal ligation ?Sexually active: *** ? ?Health maintenance ?Last Pap: 11/07/2017. Results were: Normal, 5-year repeat ?Last mammogram: 05/06/2020. Results were: Normal ?Last colonoscopy: Never ?Last Dexa: Not indicated ? ?Past medical history, past surgical history, family history and social history were all reviewed and documented in the EPIC chart. Married. Mother of 3, ages 33, 75 and 54. Father with history of colon cancer.  ? ?ROS:  A ROS was performed and pertinent positives and negatives are included. ? ?Exam: ? ?There were no vitals filed for this visit. ? ?There is no height or weight on file to calculate BMI. ? ?General appearance:  Normal ?Thyroid:  Symmetrical, normal in size, without palpable masses or nodularity. ?Respiratory ? Auscultation:  Clear without wheezing or rhonchi ?Cardiovascular ? Auscultation:  Regular rate, without rubs, murmurs or gallops ? Edema/varicosities:  Not grossly evident ?Abdominal ? Soft,nontender, without masses, guarding or rebound. ? Liver/spleen:  No organomegaly noted ? Hernia:  None appreciated ? Skin ? Inspection:  Grossly normal ?  ?Breasts: Examined lying and sitting.  ? Right: Without masses, retractions, discharge or axillary adenopathy. ? ? Left: Without masses, retractions, discharge or axillary adenopathy. ?Gentitourinary  ? Inguinal/mons:  Normal without inguinal adenopathy ? External genitalia:  Normal ? BUS/Urethra/Skene's glands:  Normal ? Vagina:  Normal ? Cervix:  Normal ? Uterus:   Retroverted, normal in size, shape and contour.  Midline and mobile ? Adnexa/parametria:   ?  Rt: Without masses or tenderness. ?  Lt: Without masses or tenderness. ? Anus and perineum: Normal ? Digital rectal exam: Normal sphincter tone without palpated masses or tenderness ? ?Assessment/Plan:  41 y.o. MBF G5P3 for annual exam.   ? ? ?Follow up in 1 year for annual ? ? ? ?[name] [facility], 9:33 AM 11/11/2021 ? ?

## 2021-12-22 ENCOUNTER — Encounter: Payer: Self-pay | Admitting: Nurse Practitioner

## 2021-12-22 ENCOUNTER — Ambulatory Visit (INDEPENDENT_AMBULATORY_CARE_PROVIDER_SITE_OTHER): Payer: BC Managed Care – PPO | Admitting: Nurse Practitioner

## 2021-12-22 VITALS — BP 124/82 | Ht 62.5 in | Wt 248.0 lb

## 2021-12-22 DIAGNOSIS — D500 Iron deficiency anemia secondary to blood loss (chronic): Secondary | ICD-10-CM

## 2021-12-22 DIAGNOSIS — Z01419 Encounter for gynecological examination (general) (routine) without abnormal findings: Secondary | ICD-10-CM | POA: Diagnosis not present

## 2021-12-22 DIAGNOSIS — N920 Excessive and frequent menstruation with regular cycle: Secondary | ICD-10-CM | POA: Diagnosis not present

## 2021-12-22 NOTE — Progress Notes (Signed)
? ?Ashley Pugh 1981/02/28 [id number] ? ? ?History:  41 y.o. G5 P3 presents for annual exam. Monthly cycles. Normal pap history. Urology referral last year due to inability to empty bladder and incontinence. 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 HTN, T2DM, HLD, vitamin D deficiency, anemia managed by PCP. Anemia likely d/t heavy menses. She has received iron transfusions.  ? ?Gynecologic History ?Patient's last menstrual period was 12/07/2021. ?Period Cycle (Days): 28 ?Period Duration (Days): 6 ?Period Pattern: Regular ?Menstrual Flow: Heavy ?Dysmenorrhea: None ?Contraception/Family planning: tubal ligation ?Sexually active: Yes ? ?Health Maintenance ?Last Pap: 11/07/2017. Results were: Normal, 5-year repeat ?Last mammogram: 05/06/2020. Results were: Normal ?Last colonoscopy: Not indicated ?Last Dexa: Not indicated ? ?Past medical history, past surgical history, family history and social history were all reviewed and documented in the EPIC chart. Married. Works for [employer]. Three children ages 52, 73, and 13. Father with history of colon cancer in 42s.  ? ?ROS:  A ROS was performed and pertinent positives and negatives are included. ? ?Exam: ? ?Vitals:  ? 12/22/21 1556  ?BP: 124/82  ?Weight: 248 lb (112.5 kg)  ?Height: 5' 2.5" (1.588 m)  ? ? ?Body mass index is 44.64 kg/m?. ? ?General appearance:  Normal ?Thyroid:  Symmetrical, normal in size, without palpable masses or nodularity. ?Respiratory ? Auscultation:  Clear without wheezing or rhonchi ?Cardiovascular ? Auscultation:  Regular rate, without rubs, murmurs or gallops ? Edema/varicosities:  Not grossly evident ?Abdominal ? Soft,nontender, without masses, guarding or rebound. ? Liver/spleen:  No organomegaly noted ? Hernia:  None appreciated ? Skin ? Inspection:  Grossly normal ?  ?Breasts: Examined lying and sitting.  ? Right: Without masses, retractions, discharge or axillary  adenopathy. ? ? Left: Without masses, retractions, discharge or axillary adenopathy. ?Genitourinary  ? Inguinal/mons:  Normal without inguinal adenopathy ? External genitalia:  Normal appearing vulva with no masses, tenderness, or lesions ? BUS/Urethra/Skene's glands:  Normal ? Vagina:  Normal appearing with normal color and discharge, no lesions ? Cervix:  Normal appearing without discharge or lesions ? Uterus:  Normal in size, shape and contour.  Midline and mobile, nontender ? Adnexa/parametria:   ?  Rt: Normal in size, without masses or tenderness. ?  Lt: Normal in size, without masses or tenderness. ? Anus and perineum: Normal ? Digital rectal exam: Normal sphincter tone without palpated masses or tenderness ? ?Patient informed chaperone available to be present for breast and pelvic exam. Patient has requested no chaperone to be present. Patient has been advised what will be completed during breast and pelvic exam.  ? ?Assessment/Plan:  41 y.o. MBF G5P3 for annual exam.   ? ?Well female exam with routine gynecological exam - Education provided on SBEs, importance of preventative screenings, current guidelines, high calcium diet, regular exercise, and multivitamin daily.  Labs with PCP. ? ?Iron deficiency anemia due to chronic blood loss - managed by PCP, has had transfusions. Likely s/t heavy menses. Recommend progestin-only hormonal contraception. She is considering mini pill versus IUD. She will call after she discussed with PCP.  ? ?Menorrhagia with regular cycle - causing anemia. Recommend menstrual suppression. ? ?Screening for cervical cancer - Normal Pap history.  Will repeat at 5-year interval per guidelines. ? ?Screening for breast cancer - Normal mammogram history. Overdue and encouraged to schedule soon. Normal breast exam today. ? ?Screening for colon cancer - Father with H/O colon cancer in late 66s. Did GI consultation but  has not scheduled colonoscopy. Encouraged to do so.  ? ?Follow up in 1 year  for annual. ? ? ? ?[name] [facility], 4:14 PM 12/22/2021 ? ?

## 2022-01-15 ENCOUNTER — Other Ambulatory Visit: Payer: Self-pay | Admitting: Internal Medicine

## 2022-01-15 DIAGNOSIS — E1169 Type 2 diabetes mellitus with other specified complication: Secondary | ICD-10-CM

## 2022-01-17 ENCOUNTER — Other Ambulatory Visit: Payer: Self-pay | Admitting: Internal Medicine

## 2022-01-17 DIAGNOSIS — E1165 Type 2 diabetes mellitus with hyperglycemia: Secondary | ICD-10-CM

## 2022-01-26 ENCOUNTER — Other Ambulatory Visit: Payer: Self-pay

## 2022-01-26 ENCOUNTER — Ambulatory Visit (INDEPENDENT_AMBULATORY_CARE_PROVIDER_SITE_OTHER): Payer: BC Managed Care – PPO | Admitting: Internal Medicine

## 2022-01-26 VITALS — BP 122/78 | HR 94 | Temp 97.8°F | Wt 251.4 lb

## 2022-01-26 DIAGNOSIS — E1169 Type 2 diabetes mellitus with other specified complication: Secondary | ICD-10-CM | POA: Diagnosis not present

## 2022-01-26 DIAGNOSIS — E1129 Type 2 diabetes mellitus with other diabetic kidney complication: Secondary | ICD-10-CM | POA: Diagnosis not present

## 2022-01-26 DIAGNOSIS — D509 Iron deficiency anemia, unspecified: Secondary | ICD-10-CM

## 2022-01-26 DIAGNOSIS — R809 Proteinuria, unspecified: Secondary | ICD-10-CM

## 2022-01-26 DIAGNOSIS — E785 Hyperlipidemia, unspecified: Secondary | ICD-10-CM

## 2022-01-26 LAB — CBC WITH DIFFERENTIAL/PLATELET
Basophils Absolute: 0 10*3/uL (ref 0.0–0.1)
Basophils Relative: 0.4 % (ref 0.0–3.0)
Eosinophils Absolute: 0.1 10*3/uL (ref 0.0–0.7)
Eosinophils Relative: 1.3 % (ref 0.0–5.0)
HCT: 29.3 % — ABNORMAL LOW (ref 36.0–46.0)
Hemoglobin: 9 g/dL — ABNORMAL LOW (ref 12.0–15.0)
Lymphocytes Relative: 29.6 % (ref 12.0–46.0)
Lymphs Abs: 1.8 10*3/uL (ref 0.7–4.0)
MCHC: 30.8 g/dL (ref 30.0–36.0)
MCV: 73.5 fl — ABNORMAL LOW (ref 78.0–100.0)
Monocytes Absolute: 0.5 10*3/uL (ref 0.1–1.0)
Monocytes Relative: 8.7 % (ref 3.0–12.0)
Neutro Abs: 3.6 10*3/uL (ref 1.4–7.7)
Neutrophils Relative %: 60 % (ref 43.0–77.0)
Platelets: 315 10*3/uL (ref 150.0–400.0)
RBC: 3.98 Mil/uL (ref 3.87–5.11)
RDW: 17.5 % — ABNORMAL HIGH (ref 11.5–15.5)
WBC: 5.9 10*3/uL (ref 4.0–10.5)

## 2022-01-26 LAB — POCT GLYCOSYLATED HEMOGLOBIN (HGB A1C): Hemoglobin A1C: 6.5 % — AB (ref 4.0–5.6)

## 2022-01-26 LAB — IBC PANEL
Iron: 25 ug/dL — ABNORMAL LOW (ref 42–145)
Saturation Ratios: 5.3 % — ABNORMAL LOW (ref 20.0–50.0)
TIBC: 473.2 ug/dL — ABNORMAL HIGH (ref 250.0–450.0)
Transferrin: 338 mg/dL (ref 212.0–360.0)

## 2022-01-26 LAB — FERRITIN: Ferritin: 8.8 ng/mL — ABNORMAL LOW (ref 10.0–291.0)

## 2022-01-26 NOTE — Progress Notes (Signed)
? ? ? ?Established Patient Office Visit ? ? ? ? ?CC/Reason for Visit: 55-month follow-up chronic medical conditions ? ?HPI: Ashley Pugh is a 41 y.o. female who is coming in today for the above mentioned reasons. Past Medical History is significant for: Type 2 diabetes mellitus with microalbuminuria, morbid obesity, hyperlipidemia, severe iron deficiency anemia and vitamin D deficiency.  She recently saw GYN who recommended hormonal birth control to try and regulate her dysfunctional uterine bleeding.  She is considering and will contact her back with her decision.  He will follow-up with her for an eye exam.  She has been tolerating her increased 1.5 mg Trulicity dose well.  She has been taking a liquid iron supplement that she believes "is too strong".  It has been causing a lot of stomach upset and headaches. ? ? ?Past Medical/Surgical History: ?Past Medical History:  ?Diagnosis Date  ? Gestational diabetes   ? during pregnancy  ? Hypertension   ? during pregnancy  ? IGT (impaired glucose tolerance)   ? ? ?Past Surgical History:  ?Procedure Laterality Date  ? BREAST REDUCTION SURGERY    ? CESAREAN SECTION    ? x 3  ? CESAREAN SECTION N/A 04/27/2016  ? Procedure: CESAREAN SECTION;  Surgeon: Levie Heritage, DO;  Location: [facility] SU[facility];  Service: Obstetrics;  Laterality: N/A;  ? ? ?Social History: ? reports that she has never smoked. She has never used smokeless tobacco. She reports current alcohol use. She reports that she does not use drugs. ? ?Allergies: ?Allergies  ?Allergen Reactions  ? Lisinopril Cough  ? ? ?Family History:  ?Family History  ?Problem Relation Age of Onset  ? Asthma Mother   ? Colon cancer Father   ? Hypertension Father   ? Asthma Sister   ? Pancreatic cancer Maternal Aunt   ? Diabetes Maternal Aunt   ? Hypertension Maternal Aunt   ? ? ? ?Current Outpatient Medications:  ?  atorvastatin (LIPITOR) 40 MG tablet, Take 1 tablet (40 mg total) by mouth daily., Disp: 30 tablet, Rfl:  5 ?  ferrous sulfate 325 (65 FE) MG EC tablet, Take 1 tablet (325 mg total) by mouth daily with breakfast., Disp: 90 tablet, Rfl: 3 ?  Prenatal Vit-Fe Fumarate-FA (PRENATAL VITAMINS PO), Take 1 tablet by mouth daily., Disp: , Rfl:  ?  TRULICITY 1.5 MG/0.5ML SOPN, INJECT 1.5 MG INTO THE SKIN ONCE A WEEK. **PHARMACY NOT CONTRACTED**, Disp: 1.5 mL, Rfl: 0 ?  valsartan (DIOVAN) 80 MG tablet, Take 1 tablet (80 mg total) by mouth daily., Disp: 90 tablet, Rfl: 1 ? ?Review of Systems:  ?Constitutional: Denies fever, chills, diaphoresis, appetite change and fatigue.  ?HEENT: Denies photophobia, eye pain, redness, hearing loss, ear pain, congestion, sore throat, rhinorrhea, sneezing, mouth sores, trouble swallowing, neck pain, neck stiffness and tinnitus.   ?Respiratory: Denies SOB, DOE, cough, chest tightness,  and wheezing.   ?Cardiovascular: Denies chest pain, palpitations and leg swelling.  ?Gastrointestinal: Denies nausea, vomiting, abdominal pain, diarrhea, constipation, blood in stool and abdominal distention.  ?Genitourinary: Denies dysuria, urgency, frequency, hematuria, flank pain and difficulty urinating.  ?Endocrine: Denies: hot or cold intolerance, sweats, changes in hair or nails, polyuria, polydipsia. ?Musculoskeletal: Denies myalgias, back pain, joint swelling, arthralgias and gait problem.  ?Skin: Denies pallor, rash and wound.  ?Neurological: Denies dizziness, seizures, syncope, weakness, light-headedness, numbness and headaches.  ?Hematological: Denies adenopathy. Easy bruising, personal or family bleeding history  ?Psychiatric/Behavioral: Denies suicidal ideation, mood changes, confusion, nervousness, sleep disturbance and  agitation ? ? ? ?Physical Exam: ?Vitals:  ? 01/26/22 1043  ?BP: 122/78  ?Pulse: 94  ?Temp: 97.8 ?F (36.6 ?C)  ?TempSrc: Oral  ?SpO2: 99%  ?Weight: 251 lb 6.4 oz (114 kg)  ? ? ?Body mass index is 45.25 kg/m?. ? ? ?Constitutional: NAD, calm, comfortable, obese ?Eyes: PERRL, lids and  conjunctivae normal ?ENMT: Mucous membranes are moist.  ?Respiratory: clear to auscultation bilaterally, no wheezing, no crackles. Normal respiratory effort. No accessory muscle use.  ?Cardiovascular: Regular rate and rhythm, no murmurs / rubs / gallops. No extremity edema. ?Neurologic: Grossly intact and nonfocal ?Psychiatric: Normal judgment and insight. Alert and oriented x 3. Normal mood.  ? ? ?Impression and Plan: ? ?Type 2 diabetes mellitus with other specified complication, without long-term current use of insulin (HCC) ? - Plan: POCT glycosylated hemoglobin (Hb A1C) ?-A1c demonstrates improved glucose management with an A1c that is down to 6.5.  She is paying approximately $60/month for her dulaglutide.  She will see if her insurance company covers semaglutide or mounjaro more favorably. ? ?Microalbuminuria due to type 2 diabetes mellitus (HCC) ?-On valsartan, she did not tolerate lisinopril due to cough. ? ?Morbid obesity (HCC) ?-Discussed healthy lifestyle, including increased physical activity and better food choices to promote weight loss. ? ?Iron deficiency anemia, unspecified iron deficiency anemia type ? - Plan: CBC with Differential/Platelets, IBC panel, Ferritin ? ?Hyperlipidemia associated with type 2 diabetes mellitus (HCC) ?-Last lipid panel in February 2023 with a total cholesterol of 120, triglycerides 91, LDL 65.  She is on atorvastatin 40 mg daily. ? ? ? ?Time spent:32 minutes reviewing chart, interviewing and examining patient and formulating plan of care. ? ? ? ? ? ?Chaya Jan, MD ?[facility] at [facility] ? ? ?

## 2022-02-14 ENCOUNTER — Other Ambulatory Visit: Payer: Self-pay | Admitting: Internal Medicine

## 2022-02-14 DIAGNOSIS — E1165 Type 2 diabetes mellitus with hyperglycemia: Secondary | ICD-10-CM

## 2022-04-11 ENCOUNTER — Other Ambulatory Visit: Payer: Self-pay | Admitting: Internal Medicine

## 2022-04-11 DIAGNOSIS — E1165 Type 2 diabetes mellitus with hyperglycemia: Secondary | ICD-10-CM

## 2022-04-19 ENCOUNTER — Other Ambulatory Visit: Payer: Self-pay | Admitting: *Deleted

## 2022-04-19 DIAGNOSIS — E1165 Type 2 diabetes mellitus with hyperglycemia: Secondary | ICD-10-CM

## 2022-04-19 MED ORDER — TRULICITY 1.5 MG/0.5ML ~~LOC~~ SOAJ: 1.5000 mg | SUBCUTANEOUS | 1 refills | Status: DC

## 2022-04-23 ENCOUNTER — Other Ambulatory Visit (HOSPITAL_COMMUNITY): Payer: Self-pay

## 2022-04-28 ENCOUNTER — Other Ambulatory Visit (HOSPITAL_COMMUNITY): Payer: Self-pay

## 2022-04-29 ENCOUNTER — Other Ambulatory Visit (HOSPITAL_COMMUNITY): Payer: Self-pay

## 2022-05-02 ENCOUNTER — Other Ambulatory Visit: Payer: Self-pay | Admitting: Internal Medicine

## 2022-05-02 DIAGNOSIS — R809 Proteinuria, unspecified: Secondary | ICD-10-CM

## 2022-05-03 ENCOUNTER — Other Ambulatory Visit (HOSPITAL_COMMUNITY): Payer: Self-pay

## 2022-05-03 ENCOUNTER — Other Ambulatory Visit: Payer: Self-pay | Admitting: Internal Medicine

## 2022-05-03 DIAGNOSIS — E1165 Type 2 diabetes mellitus with hyperglycemia: Secondary | ICD-10-CM

## 2022-05-03 DIAGNOSIS — E1169 Type 2 diabetes mellitus with other specified complication: Secondary | ICD-10-CM

## 2022-05-03 DIAGNOSIS — R809 Proteinuria, unspecified: Secondary | ICD-10-CM

## 2022-05-03 MED ORDER — TRULICITY 1.5 MG/0.5ML ~~LOC~~ SOAJ
1.5000 mg | SUBCUTANEOUS | 1 refills | Status: DC
  Filled 2022-05-03: qty 6, 84d supply, fill #0

## 2022-05-03 MED ORDER — VALSARTAN 80 MG PO TABS
80.0000 mg | ORAL_TABLET | Freq: Every day | ORAL | 1 refills | Status: DC
  Filled 2022-05-03: qty 90, 90d supply, fill #0

## 2022-05-03 MED ORDER — ATORVASTATIN CALCIUM 40 MG PO TABS
40.0000 mg | ORAL_TABLET | Freq: Every day | ORAL | 1 refills | Status: DC
  Filled 2022-05-03 – 2022-05-05 (×2): qty 90, 90d supply, fill #0

## 2022-05-05 ENCOUNTER — Other Ambulatory Visit (HOSPITAL_COMMUNITY): Payer: Self-pay

## 2022-05-05 ENCOUNTER — Telehealth: Payer: Self-pay | Admitting: Internal Medicine

## 2022-05-05 DIAGNOSIS — E1129 Type 2 diabetes mellitus with other diabetic kidney complication: Secondary | ICD-10-CM

## 2022-05-05 DIAGNOSIS — E1169 Type 2 diabetes mellitus with other specified complication: Secondary | ICD-10-CM

## 2022-05-05 MED ORDER — VALSARTAN 80 MG PO TABS: 80.0000 mg | ORAL_TABLET | Freq: Every day | ORAL | 1 refills | Status: DC

## 2022-05-05 MED ORDER — ATORVASTATIN CALCIUM 40 MG PO TABS: 40.0000 mg | ORAL_TABLET | Freq: Every day | ORAL | 1 refills | Status: DC

## 2022-05-05 NOTE — Telephone Encounter (Signed)
Rx sent 

## 2022-05-05 NOTE — Telephone Encounter (Signed)
Pt call and stated she need a refill on atorvastatin (LIPITOR) 40 MG tablet ,valsartan (DIOVAN) 80 MG tablet  sent to  [id number] Otto, [place] - [address] Phone:  [phone]  Fax:  [phone]

## 2022-08-31 ENCOUNTER — Telehealth: Payer: Self-pay | Admitting: Internal Medicine

## 2022-08-31 NOTE — Telephone Encounter (Signed)
Requesting an increase in Dulaglutide (TRULICITY) 1.5 MG/0.5ML SOPN  [id number] [address], [place] - [address] Phone: [phone]  Fax: [phone]

## 2022-08-31 NOTE — Telephone Encounter (Signed)
Patient is aware and will call back to schedule an appointment 

## 2022-09-20 ENCOUNTER — Encounter: Payer: Self-pay | Admitting: Internal Medicine

## 2022-09-20 ENCOUNTER — Telehealth: Payer: Self-pay | Admitting: Internal Medicine

## 2022-09-20 ENCOUNTER — Ambulatory Visit (INDEPENDENT_AMBULATORY_CARE_PROVIDER_SITE_OTHER): Payer: BC Managed Care – PPO | Admitting: Internal Medicine

## 2022-09-20 VITALS — BP 110/74 | HR 82 | Temp 98.3°F | Wt 244.3 lb

## 2022-09-20 DIAGNOSIS — E1129 Type 2 diabetes mellitus with other diabetic kidney complication: Secondary | ICD-10-CM

## 2022-09-20 DIAGNOSIS — E1169 Type 2 diabetes mellitus with other specified complication: Secondary | ICD-10-CM | POA: Diagnosis not present

## 2022-09-20 DIAGNOSIS — R809 Proteinuria, unspecified: Secondary | ICD-10-CM

## 2022-09-20 DIAGNOSIS — E1165 Type 2 diabetes mellitus with hyperglycemia: Secondary | ICD-10-CM

## 2022-09-20 DIAGNOSIS — E785 Hyperlipidemia, unspecified: Secondary | ICD-10-CM

## 2022-09-20 LAB — POCT GLYCOSYLATED HEMOGLOBIN (HGB A1C): Hemoglobin A1C: 9 % — AB (ref 4.0–5.6)

## 2022-09-20 MED ORDER — TRULICITY 1.5 MG/0.5ML ~~LOC~~ SOAJ: 1.5000 mg | SUBCUTANEOUS | 0 refills | Status: DC

## 2022-09-20 NOTE — Progress Notes (Signed)
Established Patient Office Visit     CC/Reason for Visit: Follow-up chronic conditions  HPI: Ashley Pugh is a 42 y.o. female who is coming in today for the above mentioned reasons. Past Medical History is significant for: Type 2 diabetes with microalbuminuria, hyperlipidemia, morbid obesity, vitamin D deficiency and iron deficiency anemia due to dysfunctional uterine bleeding.  She will be following up with GYN soon to consider hormonal birth control as a form of treating her DU B.  She stopped taking the dulaglutide a few months ago.  Reasons are unclear.  She has noticed excessive thirst, increased urination and weight gain.   Past Medical/Surgical History: Past Medical History:  Diagnosis Date   Gestational diabetes    during pregnancy   Hypertension    during pregnancy   IGT (impaired glucose tolerance)     Past Surgical History:  Procedure Laterality Date   BREAST REDUCTION SURGERY     CESAREAN SECTION     x 3   CESAREAN SECTION N/A 04/27/2016   Procedure: CESAREAN SECTION;  Surgeon: Levie Heritage, DO;  Location: [facility];  Service: Obstetrics;  Laterality: N/A;    Social History:  reports that she has never smoked. She has never used smokeless tobacco. She reports current alcohol use. She reports that she does not use drugs.  Allergies: Allergies  Allergen Reactions   Lisinopril Cough    Family History:  Family History  Problem Relation Age of Onset   Asthma Mother    Colon cancer Father    Hypertension Father    Asthma Sister    Pancreatic cancer Maternal Aunt    Diabetes Maternal Aunt    Hypertension Maternal Aunt      Current Outpatient Medications:    atorvastatin (LIPITOR) 40 MG tablet, Take 1 tablet (40 mg total) by mouth daily., Disp: 90 tablet, Rfl: 1   ferrous sulfate 325 (65 FE) MG EC tablet, Take 1 tablet (325 mg total) by mouth daily with breakfast., Disp: 90 tablet, Rfl: 3   Prenatal Vit-Fe Fumarate-FA (PRENATAL  VITAMINS PO), Take 1 tablet by mouth daily., Disp: , Rfl:    valsartan (DIOVAN) 80 MG tablet, Take 1 tablet (80 mg total) by mouth daily., Disp: 90 tablet, Rfl: 1   Dulaglutide (TRULICITY) 1.5 MG/0.5ML SOPN, Inject 1.5 mg into the skin once a week., Disp: 2 mL, Rfl: 0  Review of Systems:  Constitutional: Denies fever, chills, diaphoresis, appetite change and fatigue.  HEENT: Denies photophobia, eye pain, redness, hearing loss, ear pain, congestion, sore throat, rhinorrhea, sneezing, mouth sores, trouble swallowing, neck pain, neck stiffness and tinnitus.   Respiratory: Denies SOB, DOE, cough, chest tightness,  and wheezing.   Cardiovascular: Denies chest pain, palpitations and leg swelling.  Gastrointestinal: Denies nausea, vomiting, abdominal pain, diarrhea, constipation, blood in stool and abdominal distention.  Genitourinary: Denies dysuria, urgency, frequency, hematuria, flank pain and difficulty urinating.  Endocrine: Denies: hot or cold intolerance, sweats, changes in hair or nails, polyuria, polydipsia. Musculoskeletal: Denies myalgias, back pain, joint swelling, arthralgias and gait problem.  Skin: Denies pallor, rash and wound.  Neurological: Denies dizziness, seizures, syncope, weakness, light-headedness, numbness and headaches.  Hematological: Denies adenopathy. Easy bruising, personal or family bleeding history  Psychiatric/Behavioral: Denies suicidal ideation, mood changes, confusion, nervousness, sleep disturbance and agitation    Physical Exam: Vitals:   09/20/22 0836  BP: 110/74  Pulse: 82  Temp: 98.3 F (36.8 C)  TempSrc: Oral  SpO2: 99%  Weight: 244 lb  4.8 oz (110.8 kg)    Body mass index is 43.97 kg/m.   Constitutional: NAD, calm, comfortable Eyes: PERRL, lids and conjunctivae normal ENMT: Mucous membranes are moist.  Respiratory: clear to auscultation bilaterally, no wheezing, no crackles. Normal respiratory effort. No accessory muscle use.  Cardiovascular:  Regular rate and rhythm, no murmurs / rubs / gallops. No extremity edema.   Psychiatric: Normal judgment and insight. Alert and oriented x 3. Normal mood.    Impression and Plan:  Type 2 diabetes mellitus with other specified complication, without long-term current use of insulin ([place]) - Plan: POCT glycosylated hemoglobin (Hb A1C), Urine microalbumin-creatinine with uACR  Type 2 diabetes mellitus with microalbuminuria, without long-term current use of insulin (HCC)  Hyperlipidemia associated with type 2 diabetes mellitus ([place])  Morbid obesity ([place])  Uncontrolled type 2 diabetes mellitus with hyperglycemia ([place]) - Plan: Dulaglutide (TRULICITY) 1.5 KN/3.9JQ SOPN  -A1c the shows marked worsening from 6.5-9.0, likely due to lack of taking dulaglutide.  She agrees to resume at 1.5 mg.  After months she will notify me if she would rather increase dose or stay at same. -She is known to have microalbuminuria and is on valsartan for the same. -Check lipids at next visit, she is on atorvastatin 40 mg daily. -Discussed healthy lifestyle, including increased physical activity and better food choices to promote weight loss.  Time spent:33 minutes reviewing chart, interviewing and examining patient and formulating plan of care.     Lelon Frohlich, MD [facility] at [facility]

## 2022-09-20 NOTE — Telephone Encounter (Signed)
Pt states her Dulaglutide (TRULICITY) 1.5 EH/6.3JS SOPN needs a PA

## 2022-09-21 ENCOUNTER — Other Ambulatory Visit (HOSPITAL_COMMUNITY): Payer: Self-pay

## 2022-09-21 NOTE — Telephone Encounter (Signed)
Patient Advocate Encounter  Prior authorization is required for Trulicity 1.5MG /0.5ML pen-injectors. PA submitted and APPROVED on 09/21/22.  Key [id number] Effective: 08/22/22 - 09/21/23

## 2022-09-22 ENCOUNTER — Other Ambulatory Visit: Payer: Self-pay | Admitting: Internal Medicine

## 2022-09-22 ENCOUNTER — Telehealth: Payer: Self-pay | Admitting: Internal Medicine

## 2022-09-22 DIAGNOSIS — E1165 Type 2 diabetes mellitus with hyperglycemia: Secondary | ICD-10-CM

## 2022-09-22 NOTE — Telephone Encounter (Signed)
Advised patient that we don't call pharmacies to search for patient meds. Patient repeatedly asked what she should do next, advised her to continue to call pharmacies to find one that has the medication

## 2022-09-22 NOTE — Telephone Encounter (Signed)
Cannot find a pharmacy that carries Dulaglutide (TRULICITY) 1.5 LY/6.5KP SOPN . Asking what is her next course of action?

## 2022-09-28 ENCOUNTER — Other Ambulatory Visit (HOSPITAL_COMMUNITY): Payer: Self-pay

## 2022-09-28 ENCOUNTER — Telehealth: Payer: Self-pay | Admitting: Internal Medicine

## 2022-09-28 DIAGNOSIS — E1129 Type 2 diabetes mellitus with other diabetic kidney complication: Secondary | ICD-10-CM

## 2022-09-28 DIAGNOSIS — E1165 Type 2 diabetes mellitus with hyperglycemia: Secondary | ICD-10-CM

## 2022-09-28 DIAGNOSIS — E1169 Type 2 diabetes mellitus with other specified complication: Secondary | ICD-10-CM

## 2022-09-28 MED ORDER — ATORVASTATIN CALCIUM 40 MG PO TABS
40.0000 mg | ORAL_TABLET | Freq: Every day | ORAL | 1 refills | Status: DC
  Filled 2022-09-28: qty 90, 90d supply, fill #0

## 2022-09-28 MED ORDER — VALSARTAN 80 MG PO TABS
80.0000 mg | ORAL_TABLET | Freq: Every day | ORAL | 1 refills | Status: DC
  Filled 2022-09-28: qty 90, 90d supply, fill #0

## 2022-09-28 MED ORDER — TRULICITY 1.5 MG/0.5ML ~~LOC~~ SOAJ
1.5000 mg | SUBCUTANEOUS | 2 refills | Status: DC
  Filled 2022-09-28: qty 2, 28d supply, fill #0

## 2022-09-28 NOTE — Telephone Encounter (Signed)
valsartan (DIOVAN) 80 MG tablet  atorvastatin (LIPITOR) 40 MG tablet Dulaglutide (TRULICITY) 1.5 BJ/6.2GB [place] Phone: [phone]  Fax: [phone]    Please re-route these to the above pharmacy

## 2022-09-28 NOTE — Telephone Encounter (Signed)
Refill sent.

## 2022-09-29 ENCOUNTER — Encounter: Payer: Self-pay | Admitting: Internal Medicine

## 2022-09-30 ENCOUNTER — Other Ambulatory Visit: Payer: Self-pay | Admitting: Internal Medicine

## 2022-09-30 DIAGNOSIS — E1129 Type 2 diabetes mellitus with other diabetic kidney complication: Secondary | ICD-10-CM

## 2022-09-30 MED ORDER — TIRZEPATIDE 2.5 MG/0.5ML ~~LOC~~ SOAJ: 2.5000 mg | SUBCUTANEOUS | 0 refills | Status: DC

## 2022-10-01 ENCOUNTER — Other Ambulatory Visit: Payer: Self-pay | Admitting: Internal Medicine

## 2022-10-01 DIAGNOSIS — E1129 Type 2 diabetes mellitus with other diabetic kidney complication: Secondary | ICD-10-CM

## 2022-10-01 DIAGNOSIS — E1169 Type 2 diabetes mellitus with other specified complication: Secondary | ICD-10-CM

## 2022-10-05 NOTE — Telephone Encounter (Signed)
Pt called to FU on the PA for the [place] ([place]), [place] [facility] [place] Phone: [phone]  Fax: [phone]

## 2022-10-06 NOTE — Telephone Encounter (Signed)
Pt called to say she just called [facility] and was told they are still waiting for MD's approval.

## 2022-10-06 NOTE — Telephone Encounter (Signed)
Sent to the prior authorization team

## 2022-10-08 ENCOUNTER — Other Ambulatory Visit (HOSPITAL_COMMUNITY): Payer: Self-pay

## 2022-10-08 NOTE — Telephone Encounter (Signed)
Pharmacy Patient Advocate Encounter  Prior Authorization for Ashley Pugh has been submitted and approved.    PA# [id number] Effective dates: 09/08/2022 through 10/08/2023

## 2022-10-18 ENCOUNTER — Other Ambulatory Visit (HOSPITAL_COMMUNITY): Payer: Self-pay

## 2022-10-30 ENCOUNTER — Other Ambulatory Visit: Payer: Self-pay | Admitting: Internal Medicine

## 2022-10-30 DIAGNOSIS — R809 Proteinuria, unspecified: Secondary | ICD-10-CM

## 2022-11-07 ENCOUNTER — Encounter: Payer: Self-pay | Admitting: Internal Medicine

## 2022-11-09 ENCOUNTER — Other Ambulatory Visit: Payer: Self-pay | Admitting: Internal Medicine

## 2022-11-09 DIAGNOSIS — E1129 Type 2 diabetes mellitus with other diabetic kidney complication: Secondary | ICD-10-CM

## 2022-11-09 MED ORDER — TIRZEPATIDE 5 MG/0.5ML ~~LOC~~ SOAJ: 5.0000 mg | SUBCUTANEOUS | 0 refills | Status: DC

## 2022-11-30 ENCOUNTER — Encounter: Payer: Self-pay | Admitting: Internal Medicine

## 2022-11-30 ENCOUNTER — Ambulatory Visit (INDEPENDENT_AMBULATORY_CARE_PROVIDER_SITE_OTHER): Payer: BC Managed Care – PPO | Admitting: Internal Medicine

## 2022-11-30 VITALS — BP 124/86 | Temp 98.3°F | Wt 241.7 lb

## 2022-11-30 DIAGNOSIS — D509 Iron deficiency anemia, unspecified: Secondary | ICD-10-CM

## 2022-11-30 DIAGNOSIS — E559 Vitamin D deficiency, unspecified: Secondary | ICD-10-CM

## 2022-11-30 DIAGNOSIS — E1169 Type 2 diabetes mellitus with other specified complication: Secondary | ICD-10-CM | POA: Diagnosis not present

## 2022-11-30 DIAGNOSIS — E785 Hyperlipidemia, unspecified: Secondary | ICD-10-CM

## 2022-11-30 DIAGNOSIS — R809 Proteinuria, unspecified: Secondary | ICD-10-CM

## 2022-11-30 DIAGNOSIS — E1129 Type 2 diabetes mellitus with other diabetic kidney complication: Secondary | ICD-10-CM

## 2022-11-30 MED ORDER — TIRZEPATIDE 7.5 MG/0.5ML ~~LOC~~ SOAJ: 7.5000 mg | SUBCUTANEOUS | 0 refills | Status: DC

## 2022-11-30 NOTE — Progress Notes (Signed)
Established Patient Office Visit     CC/Reason for Visit: Follow-up chronic conditions  HPI: Ashley Pugh is a 42 y.o. female who is coming in today for the above mentioned reasons. Past Medical History is significant for: Hyperlipidemia, type 2 diabetes, morbid obesity, vitamin D deficiency, iron deficiency anemia due to dysfunctional uterine bleeding.  She is requesting an increase of Mounjaro dose, she is tolerating well.   Past Medical/Surgical History: Past Medical History:  Diagnosis Date   Gestational diabetes    during pregnancy   Hypertension    during pregnancy   IGT (impaired glucose tolerance)     Past Surgical History:  Procedure Laterality Date   BREAST REDUCTION SURGERY     CESAREAN SECTION     x 3   CESAREAN SECTION N/A 04/27/2016   Procedure: CESAREAN SECTION;  Surgeon: Truett Mainland, DO;  Location: [place];  Service: Obstetrics;  Laterality: N/A;    Social History:  reports that she has never smoked. She has never used smokeless tobacco. She reports current alcohol use. She reports that she does not use drugs.  Allergies: Allergies  Allergen Reactions   Lisinopril Cough    Family History:  Family History  Problem Relation Age of Onset   Asthma Mother    Colon cancer Father    Hypertension Father    Asthma Sister    Pancreatic cancer Maternal Aunt    Diabetes Maternal Aunt    Hypertension Maternal Aunt      Current Outpatient Medications:    atorvastatin (LIPITOR) 40 MG tablet, TAKE 1 TABLET BY MOUTH EVERY DAY, Disp: 30 tablet, Rfl: 5   ferrous sulfate 325 (65 FE) MG EC tablet, Take 1 tablet (325 mg total) by mouth daily with breakfast., Disp: 90 tablet, Rfl: 3   Prenatal Vit-Fe Fumarate-FA (PRENATAL VITAMINS PO), Take 1 tablet by mouth daily., Disp: , Rfl:    tirzepatide (MOUNJARO) 7.5 MG/0.5ML Pen, Inject 7.5 mg into the skin once a week., Disp: 6 mL, Rfl: 0   valsartan (DIOVAN) 80 MG tablet, TAKE 1 TABLET BY  MOUTH EVERY DAY, Disp: 30 tablet, Rfl: 5  Review of Systems:  Negative unless indicated in HPI.   Physical Exam: Vitals:   11/30/22 1405  BP: 124/86  Temp: 98.3 F (36.8 C)  TempSrc: Oral  Weight: 241 lb 11.2 oz (109.6 kg)    Body mass index is 43.5 kg/m.   Physical Exam Vitals reviewed.  Constitutional:      Appearance: Normal appearance.  HENT:     Head: Normocephalic and atraumatic.  Eyes:     Conjunctiva/sclera: Conjunctivae normal.     Pupils: Pupils are equal, round, and reactive to light.  Cardiovascular:     Rate and Rhythm: Normal rate and regular rhythm.  Pulmonary:     Effort: Pulmonary effort is normal.     Breath sounds: Normal breath sounds.  Skin:    General: Skin is warm and dry.  Neurological:     General: No focal deficit present.     Mental Status: She is alert and oriented to person, place, and time.  Psychiatric:        Mood and Affect: Mood normal.        Behavior: Behavior normal.        Thought Content: Thought content normal.        Judgment: Judgment normal.      Impression and Plan:  Type 2 diabetes mellitus with microalbuminuria, without  long-term current use of insulin ([place]) - Plan: tirzepatide (MOUNJARO) 7.5 MG/0.5ML Pen  Morbid obesity ([place])  Hyperlipidemia associated with type 2 diabetes mellitus ([place])  Iron deficiency anemia, unspecified iron deficiency anemia type  Vitamin D deficiency  Microalbuminuria due to type 2 diabetes mellitus ([place])  -Increase Mounjaro from 5-7.5, she is tolerating well.  Not quite due for A1c recheck today. -Blood pressure is well-controlled. -She has follow-up with GYN scheduled for next month to discuss to her iron deficiency anemia due to dysfunctional uterine bleeding. -Discussed healthy lifestyle, including increased physical activity and better food choices to promote weight loss. -Congratulated on weight loss success thus far.  Time spent:31 minutes reviewing chart, interviewing and  examining patient and formulating plan of care.     Lelon Frohlich, MD  Primary Care at [facility]

## 2022-12-20 ENCOUNTER — Ambulatory Visit: Payer: BC Managed Care – PPO | Admitting: Internal Medicine

## 2022-12-27 ENCOUNTER — Ambulatory Visit: Payer: BC Managed Care – PPO | Admitting: Internal Medicine

## 2023-01-27 ENCOUNTER — Other Ambulatory Visit: Payer: Self-pay | Admitting: Internal Medicine

## 2023-01-27 DIAGNOSIS — E1129 Type 2 diabetes mellitus with other diabetic kidney complication: Secondary | ICD-10-CM

## 2023-02-01 ENCOUNTER — Other Ambulatory Visit (HOSPITAL_COMMUNITY): Payer: Self-pay

## 2023-02-01 ENCOUNTER — Telehealth: Payer: Self-pay | Admitting: Internal Medicine

## 2023-02-01 ENCOUNTER — Other Ambulatory Visit (HOSPITAL_BASED_OUTPATIENT_CLINIC_OR_DEPARTMENT_OTHER): Payer: Self-pay

## 2023-02-01 DIAGNOSIS — R809 Proteinuria, unspecified: Secondary | ICD-10-CM

## 2023-02-01 MED ORDER — TIRZEPATIDE 7.5 MG/0.5ML ~~LOC~~ SOAJ
7.5000 mg | SUBCUTANEOUS | 0 refills | Status: DC
  Filled 2023-02-01 (×2): qty 2, 28d supply, fill #0
  Filled 2023-02-22: qty 2, 28d supply, fill #1
  Filled 2023-02-23: qty 2, 28d supply, fill #0
  Filled 2023-03-23 – 2023-03-24 (×2): qty 2, 28d supply, fill #1

## 2023-02-01 NOTE — Telephone Encounter (Signed)
Pt called to say she only ever picked up one box of the tirzepatide [facility]) 7.5 MG/0.5ML Pen ... and then the pharmacy ran out.  Pt states [facility] has the 2.5 mg now, and she would like to request a new Rx so that she can go pick it up.  [facility] [address] [place]), [place] - [id number] Phone: [phone]  Fax: [phone]      Please advise.

## 2023-02-01 NOTE — Telephone Encounter (Signed)
Patient is aware. Patient will check and call us back.

## 2023-02-23 ENCOUNTER — Other Ambulatory Visit (HOSPITAL_BASED_OUTPATIENT_CLINIC_OR_DEPARTMENT_OTHER): Payer: Self-pay

## 2023-02-24 ENCOUNTER — Other Ambulatory Visit (HOSPITAL_COMMUNITY): Payer: Self-pay

## 2023-02-28 ENCOUNTER — Other Ambulatory Visit (HOSPITAL_COMMUNITY): Payer: Self-pay

## 2023-03-24 ENCOUNTER — Other Ambulatory Visit (HOSPITAL_COMMUNITY): Payer: Self-pay

## 2023-05-09 ENCOUNTER — Other Ambulatory Visit: Payer: Self-pay | Admitting: Internal Medicine

## 2023-05-09 DIAGNOSIS — E1129 Type 2 diabetes mellitus with other diabetic kidney complication: Secondary | ICD-10-CM

## 2023-05-10 ENCOUNTER — Other Ambulatory Visit (HOSPITAL_COMMUNITY): Payer: Self-pay

## 2023-05-10 MED ORDER — MOUNJARO 7.5 MG/0.5ML ~~LOC~~ SOAJ
7.5000 mg | SUBCUTANEOUS | 0 refills | Status: DC
  Filled 2023-05-10: qty 2, 28d supply, fill #0
  Filled 2023-06-20: qty 2, 28d supply, fill #1
  Filled 2023-08-05: qty 2, 28d supply, fill #2

## 2023-06-22 ENCOUNTER — Encounter: Payer: BC Managed Care – PPO | Admitting: Internal Medicine

## 2023-06-24 ENCOUNTER — Other Ambulatory Visit (HOSPITAL_COMMUNITY): Payer: Self-pay

## 2023-06-27 ENCOUNTER — Ambulatory Visit (INDEPENDENT_AMBULATORY_CARE_PROVIDER_SITE_OTHER): Payer: BC Managed Care – PPO | Admitting: Internal Medicine

## 2023-06-27 ENCOUNTER — Encounter: Payer: Self-pay | Admitting: Internal Medicine

## 2023-06-27 VITALS — BP 110/78 | HR 98 | Temp 98.7°F | Ht 63.0 in | Wt 244.1 lb

## 2023-06-27 DIAGNOSIS — Z1159 Encounter for screening for other viral diseases: Secondary | ICD-10-CM

## 2023-06-27 DIAGNOSIS — Z0000 Encounter for general adult medical examination without abnormal findings: Secondary | ICD-10-CM | POA: Diagnosis not present

## 2023-06-27 DIAGNOSIS — D509 Iron deficiency anemia, unspecified: Secondary | ICD-10-CM

## 2023-06-27 DIAGNOSIS — R809 Proteinuria, unspecified: Secondary | ICD-10-CM

## 2023-06-27 DIAGNOSIS — E1169 Type 2 diabetes mellitus with other specified complication: Secondary | ICD-10-CM

## 2023-06-27 DIAGNOSIS — E1129 Type 2 diabetes mellitus with other diabetic kidney complication: Secondary | ICD-10-CM | POA: Diagnosis not present

## 2023-06-27 DIAGNOSIS — E559 Vitamin D deficiency, unspecified: Secondary | ICD-10-CM

## 2023-06-27 DIAGNOSIS — Z1231 Encounter for screening mammogram for malignant neoplasm of breast: Secondary | ICD-10-CM

## 2023-06-27 DIAGNOSIS — E785 Hyperlipidemia, unspecified: Secondary | ICD-10-CM

## 2023-06-27 DIAGNOSIS — Z6834 Body mass index (BMI) 34.0-34.9, adult: Secondary | ICD-10-CM | POA: Diagnosis not present

## 2023-06-27 NOTE — Progress Notes (Signed)
Established Patient Office Visit     CC/Reason for Visit: Annual preventive exam  HPI: Ashley Pugh is a 42 y.o. female who is coming in today for the above mentioned reasons. Past Medical History is significant for: Type 2 diabetes, hyperlipidemia, morbid obesity, vitamin D deficiency.  Has been feeling well.  Has routine eye and dental care.  Is overdue for mammogram and Pap smear.  Is due for COVID vaccination.   Past Medical/Surgical History: Past Medical History:  Diagnosis Date   Gestational diabetes    during pregnancy   Hypertension    during pregnancy   IGT (impaired glucose tolerance)     Past Surgical History:  Procedure Laterality Date   BREAST REDUCTION SURGERY     CESAREAN SECTION     x 3   CESAREAN SECTION N/A 04/27/2016   Procedure: CESAREAN SECTION;  Surgeon: Levie Heritage, DO;  Location: [facility] BIRTHING SUITES;  Service: Obstetrics;  Laterality: N/A;    Social History:  reports that she has never smoked. She has never used smokeless tobacco. She reports current alcohol use. She reports that she does not use drugs.  Allergies: Allergies  Allergen Reactions   Lisinopril Cough    Family History:  Family History  Problem Relation Age of Onset   Asthma Mother    Colon cancer Father    Hypertension Father    Asthma Sister    Pancreatic cancer Maternal Aunt    Diabetes Maternal Aunt    Hypertension Maternal Aunt      Current Outpatient Medications:    atorvastatin (LIPITOR) 40 MG tablet, TAKE 1 TABLET BY MOUTH EVERY DAY, Disp: 30 tablet, Rfl: 5   ferrous sulfate 325 (65 FE) MG EC tablet, Take 1 tablet (325 mg total) by mouth daily with breakfast., Disp: 90 tablet, Rfl: 3   Prenatal Vit-Fe Fumarate-FA (PRENATAL VITAMINS PO), Take 1 tablet by mouth daily., Disp: , Rfl:    tirzepatide (MOUNJARO) 7.5 MG/0.5ML Pen, Inject 7.5 mg into the skin once a week., Disp: 6 mL, Rfl: 0   valsartan (DIOVAN) 80 MG tablet, TAKE 1 TABLET BY MOUTH EVERY  DAY, Disp: 30 tablet, Rfl: 5  Review of Systems:  Negative unless indicated in HPI.   Physical Exam: Vitals:   06/27/23 1453  BP: 110/78  Pulse: 98  Temp: 98.7 F (37.1 C)  TempSrc: Oral  SpO2: 100%  Weight: 244 lb 1.6 oz (110.7 kg)  Height: 5\' 3"  (1.6 m)    Body mass index is 43.24 kg/m.   Physical Exam Vitals reviewed.  Constitutional:      General: She is not in acute distress.    Appearance: Normal appearance. She is not ill-appearing, toxic-appearing or diaphoretic.  HENT:     Head: Normocephalic.     Right Ear: Tympanic membrane, ear canal and external ear normal. There is no impacted cerumen.     Left Ear: Tympanic membrane, ear canal and external ear normal. There is no impacted cerumen.     Nose: Nose normal.     Mouth/Throat:     Mouth: Mucous membranes are moist.     Pharynx: Oropharynx is clear. No oropharyngeal exudate or posterior oropharyngeal erythema.  Eyes:     General: No scleral icterus.       Right eye: No discharge.        Left eye: No discharge.     Conjunctiva/sclera: Conjunctivae normal.     Pupils: Pupils are equal, round, and reactive to  light.  Neck:     Vascular: No carotid bruit.  Cardiovascular:     Rate and Rhythm: Normal rate and regular rhythm.     Pulses: Normal pulses.     Heart sounds: Normal heart sounds.  Pulmonary:     Effort: Pulmonary effort is normal. No respiratory distress.     Breath sounds: Normal breath sounds.  Abdominal:     General: Abdomen is flat. Bowel sounds are normal.     Palpations: Abdomen is soft.  Musculoskeletal:        General: Normal range of motion.     Cervical back: Normal range of motion.  Skin:    General: Skin is warm and dry.  Neurological:     General: No focal deficit present.     Mental Status: She is alert and oriented to person, place, and time. Mental status is at baseline.  Psychiatric:        Mood and Affect: Mood normal.        Behavior: Behavior normal.        Thought  Content: Thought content normal.        Judgment: Judgment normal.     Flowsheet Row Office Visit from 09/20/2022 in [facility] [place]  PHQ-9 Total Score 0        Impression and Plan:  Type 2 diabetes mellitus with microalbuminuria, without long-term current use of insulin (HCC) -     CBC with Differential/Platelet; Future -     Comprehensive metabolic panel; Future -     Hemoglobin A1c; Future -     Microalbumin / creatinine urine ratio; Future  Iron deficiency anemia, unspecified iron deficiency anemia type  Vitamin D deficiency  Encounter for preventive health examination  Morbid obesity (HCC) -     TSH; Future -     Vitamin B12; Future -     VITAMIN D 25 Hydroxy (Vit-D Deficiency, Fractures); Future  Microalbuminuria due to type 2 diabetes mellitus (HCC)  Hyperlipidemia associated with type 2 diabetes mellitus (HCC) -     Lipid panel; Future  Encounter for hepatitis C screening test for low risk patient -     Hepatitis C antibody; Future  Screening mammogram for breast cancer -     3D Screening Mammogram, Left and Right; Future   -Recommend routine eye and dental care. -Healthy lifestyle discussed in detail. -Labs to be updated today. -Prostate cancer screening: N/A Health Maintenance  Topic Date Due   Eye exam for diabetics  Never done   Hepatitis C Screening  Never done   Yearly kidney function blood test for diabetes  07/22/2022   Yearly kidney health urinalysis for diabetes  07/22/2022   Hemoglobin A1C  03/21/2023   COVID-19 Vaccine (1 - 2023-24 season) Never done   Pap with HPV screening  09/27/2023*   Complete foot exam   06/26/2024   DTaP/Tdap/Td vaccine (2 - Tdap) 01/11/2027   Flu Shot  Completed   HIV Screening  Completed   HPV Vaccine  Aged Out  *Topic was postponed. The date shown is not the original due date.     -Declines COVID-vaccine despite counseling, all her immunizations are up-to-date. -Due for mammogram  and cervical cancer screening, orders placed. -She had an eye exam in September, obtain records.     Chaya Jan, MD [facility]

## 2023-07-12 ENCOUNTER — Other Ambulatory Visit (INDEPENDENT_AMBULATORY_CARE_PROVIDER_SITE_OTHER): Payer: BC Managed Care – PPO

## 2023-07-12 DIAGNOSIS — E785 Hyperlipidemia, unspecified: Secondary | ICD-10-CM

## 2023-07-12 DIAGNOSIS — R809 Proteinuria, unspecified: Secondary | ICD-10-CM | POA: Diagnosis not present

## 2023-07-12 DIAGNOSIS — Z1159 Encounter for screening for other viral diseases: Secondary | ICD-10-CM

## 2023-07-12 DIAGNOSIS — E1129 Type 2 diabetes mellitus with other diabetic kidney complication: Secondary | ICD-10-CM | POA: Diagnosis not present

## 2023-07-12 DIAGNOSIS — E1169 Type 2 diabetes mellitus with other specified complication: Secondary | ICD-10-CM | POA: Diagnosis not present

## 2023-07-12 LAB — COMPREHENSIVE METABOLIC PANEL
ALT: 21 U/L (ref 0–35)
AST: 14 U/L (ref 0–37)
Albumin: 3.8 g/dL (ref 3.5–5.2)
Alkaline Phosphatase: 92 U/L (ref 39–117)
BUN: 10 mg/dL (ref 6–23)
CO2: 23 meq/L (ref 19–32)
Calcium: 8.7 mg/dL (ref 8.4–10.5)
Chloride: 106 meq/L (ref 96–112)
Creatinine, Ser: 0.57 mg/dL (ref 0.40–1.20)
GFR: 110.93 mL/min (ref >60.00)
Glucose, Bld: 137 mg/dL — ABNORMAL HIGH (ref 70–99)
Potassium: 4 meq/L (ref 3.5–5.1)
Sodium: 136 meq/L (ref 135–145)
Total Bilirubin: 0.5 mg/dL (ref 0.2–1.2)
Total Protein: 7.1 g/dL (ref 6.0–8.3)

## 2023-07-12 LAB — CBC WITH DIFFERENTIAL/PLATELET
Basophils Absolute: 0 10*3/uL (ref 0.0–0.1)
Basophils Relative: 0.3 % (ref 0.0–3.0)
Eosinophils Absolute: 0.1 10*3/uL (ref 0.0–0.7)
Eosinophils Relative: 1 % (ref 0.0–5.0)
HCT: 30.5 % — ABNORMAL LOW (ref 36.0–46.0)
Hemoglobin: 9.4 g/dL — ABNORMAL LOW (ref 12.0–15.0)
Lymphocytes Relative: 24.5 % (ref 12.0–46.0)
Lymphs Abs: 1.3 10*3/uL (ref 0.7–4.0)
MCHC: 30.8 g/dL (ref 30.0–36.0)
MCV: 79.2 fL (ref 78.0–100.0)
Monocytes Absolute: 0.5 10*3/uL (ref 0.1–1.0)
Monocytes Relative: 8.8 % (ref 3.0–12.0)
Neutro Abs: 3.5 10*3/uL (ref 1.4–7.7)
Neutrophils Relative %: 65.4 % (ref 43.0–77.0)
Platelets: 279 10*3/uL (ref 150.0–400.0)
RBC: 3.86 Mil/uL — ABNORMAL LOW (ref 3.87–5.11)
RDW: 17.6 % — ABNORMAL HIGH (ref 11.5–15.5)
WBC: 5.3 10*3/uL (ref 4.0–10.5)

## 2023-07-12 LAB — LIPID PANEL
Cholesterol: 131 mg/dL (ref 0–200)
HDL: 37.7 mg/dL — ABNORMAL LOW (ref >39.00)
LDL Cholesterol: 75 mg/dL (ref 0–99)
NonHDL: 92.84
Total CHOL/HDL Ratio: 3
Triglycerides: 90 mg/dL (ref 0.0–149.0)
VLDL: 18 mg/dL (ref 0.0–40.0)

## 2023-07-12 LAB — VITAMIN B12: Vitamin B-12: 715 pg/mL (ref 211–911)

## 2023-07-12 LAB — MICROALBUMIN / CREATININE URINE RATIO
Creatinine,U: 164.6 mg/dL
Microalb Creat Ratio: 1.1 mg/g (ref 0.0–30.0)
Microalb, Ur: 1.8 mg/dL (ref 0.0–1.9)

## 2023-07-12 LAB — HEMOGLOBIN A1C: Hgb A1c MFr Bld: 6.9 % — ABNORMAL HIGH (ref 4.6–6.5)

## 2023-07-12 LAB — TSH: TSH: 1.88 u[IU]/mL (ref 0.35–5.50)

## 2023-07-12 LAB — VITAMIN D 25 HYDROXY (VIT D DEFICIENCY, FRACTURES): VITD: 14.88 ng/mL — ABNORMAL LOW (ref 30.00–100.00)

## 2023-07-13 LAB — HEPATITIS C ANTIBODY: Hepatitis C Ab: NONREACTIVE

## 2023-07-14 ENCOUNTER — Other Ambulatory Visit: Payer: Self-pay | Admitting: Internal Medicine

## 2023-07-14 DIAGNOSIS — E1169 Type 2 diabetes mellitus with other specified complication: Secondary | ICD-10-CM

## 2023-07-14 DIAGNOSIS — E559 Vitamin D deficiency, unspecified: Secondary | ICD-10-CM

## 2023-07-14 MED ORDER — VITAMIN D (ERGOCALCIFEROL) 1.25 MG (50000 UNIT) PO CAPS: 50000.0000 [IU] | ORAL_CAPSULE | ORAL | 0 refills | Status: AC

## 2023-07-14 MED ORDER — ATORVASTATIN CALCIUM 80 MG PO TABS: 80.0000 mg | ORAL_TABLET | Freq: Every day | ORAL | 1 refills | Status: DC

## 2023-08-12 ENCOUNTER — Other Ambulatory Visit (HOSPITAL_COMMUNITY): Payer: Self-pay

## 2023-09-08 ENCOUNTER — Ambulatory Visit: Payer: BC Managed Care – PPO

## 2023-09-30 ENCOUNTER — Ambulatory Visit
Admission: RE | Admit: 2023-09-30 | Discharge: 2023-09-30 | Disposition: A | Discharge status: Home / Self Care | Payer: BC Managed Care – PPO | Source: Ambulatory Visit | Attending: Internal Medicine | Admitting: Internal Medicine

## 2023-09-30 DIAGNOSIS — Z1231 Encounter for screening mammogram for malignant neoplasm of breast: Secondary | ICD-10-CM | POA: Diagnosis not present

## 2023-10-13 ENCOUNTER — Other Ambulatory Visit: Payer: Self-pay | Admitting: Internal Medicine

## 2023-10-13 DIAGNOSIS — E1129 Type 2 diabetes mellitus with other diabetic kidney complication: Secondary | ICD-10-CM

## 2023-10-17 ENCOUNTER — Other Ambulatory Visit (HOSPITAL_COMMUNITY): Payer: Self-pay

## 2023-10-17 MED ORDER — MOUNJARO 7.5 MG/0.5ML ~~LOC~~ SOAJ
7.5000 mg | SUBCUTANEOUS | 0 refills | Status: DC
  Filled 2023-10-17 – 2023-12-01 (×4): qty 2, 28d supply, fill #0
  Filled 2024-01-20: qty 2, 28d supply, fill #1
  Filled 2024-03-15: qty 2, 28d supply, fill #2

## 2023-10-19 ENCOUNTER — Other Ambulatory Visit (HOSPITAL_COMMUNITY): Payer: Self-pay

## 2023-10-21 ENCOUNTER — Other Ambulatory Visit: Payer: Self-pay | Admitting: Internal Medicine

## 2023-10-21 DIAGNOSIS — E559 Vitamin D deficiency, unspecified: Secondary | ICD-10-CM

## 2023-11-01 ENCOUNTER — Other Ambulatory Visit (HOSPITAL_COMMUNITY): Payer: Self-pay

## 2023-11-23 ENCOUNTER — Other Ambulatory Visit (HOSPITAL_BASED_OUTPATIENT_CLINIC_OR_DEPARTMENT_OTHER): Payer: Self-pay

## 2023-11-23 ENCOUNTER — Ambulatory Visit (INDEPENDENT_AMBULATORY_CARE_PROVIDER_SITE_OTHER): Admitting: Internal Medicine

## 2023-11-23 ENCOUNTER — Encounter: Payer: Self-pay | Admitting: Internal Medicine

## 2023-11-23 ENCOUNTER — Other Ambulatory Visit (HOSPITAL_COMMUNITY): Payer: Self-pay

## 2023-11-23 VITALS — BP 120/84 | HR 64 | Temp 98.4°F | Wt 243.4 lb

## 2023-11-23 DIAGNOSIS — Z6841 Body Mass Index (BMI) 40.0 and over, adult: Secondary | ICD-10-CM

## 2023-11-23 DIAGNOSIS — E785 Hyperlipidemia, unspecified: Secondary | ICD-10-CM | POA: Diagnosis not present

## 2023-11-23 DIAGNOSIS — E1129 Type 2 diabetes mellitus with other diabetic kidney complication: Secondary | ICD-10-CM | POA: Diagnosis not present

## 2023-11-23 DIAGNOSIS — R809 Proteinuria, unspecified: Secondary | ICD-10-CM

## 2023-11-23 DIAGNOSIS — E559 Vitamin D deficiency, unspecified: Secondary | ICD-10-CM

## 2023-11-23 DIAGNOSIS — E1169 Type 2 diabetes mellitus with other specified complication: Secondary | ICD-10-CM | POA: Diagnosis not present

## 2023-11-23 DIAGNOSIS — D509 Iron deficiency anemia, unspecified: Secondary | ICD-10-CM

## 2023-11-23 LAB — POCT GLYCOSYLATED HEMOGLOBIN (HGB A1C): Hemoglobin A1C: 8.1 % — AB (ref 4.0–5.6)

## 2023-11-23 LAB — LIPID PANEL
Cholesterol: 175 mg/dL (ref 0–200)
HDL: 43.8 mg/dL (ref >39.00)
LDL Cholesterol: 114 mg/dL — ABNORMAL HIGH (ref 0–99)
NonHDL: 131.02
Total CHOL/HDL Ratio: 4
Triglycerides: 86 mg/dL (ref 0.0–149.0)
VLDL: 17.2 mg/dL (ref 0.0–40.0)

## 2023-11-23 LAB — VITAMIN D 25 HYDROXY (VIT D DEFICIENCY, FRACTURES): VITD: 19.16 ng/mL — ABNORMAL LOW (ref 30.00–100.00)

## 2023-11-23 MED ORDER — VALSARTAN 80 MG PO TABS: 80.0000 mg | ORAL_TABLET | Freq: Every day | ORAL | 1 refills | Status: DC

## 2023-11-23 MED ORDER — ATORVASTATIN CALCIUM 80 MG PO TABS: 80.0000 mg | ORAL_TABLET | Freq: Every day | ORAL | 1 refills | Status: DC

## 2023-11-23 NOTE — Assessment & Plan Note (Signed)
 A1c is increased to 8.1, not surprising as she has been off Mounjaro for 2 months.  Send medication.  Return in 3 months for follow-up.

## 2023-11-23 NOTE — Assessment & Plan Note (Signed)
 Discussed healthy lifestyle, including increased physical activity and better food choices to promote weight loss.

## 2023-11-23 NOTE — Assessment & Plan Note (Signed)
 On ARB

## 2023-11-23 NOTE — Assessment & Plan Note (Signed)
 Check CBC and iron studies today, for now continue iron supplementation.

## 2023-11-23 NOTE — Progress Notes (Signed)
 Established Patient Office Visit     CC/Reason for Visit: Follow-up chronic conditions  HPI: Ashley Pugh is a 43 y.o. female who is coming in today for the above mentioned reasons. Past Medical History is significant for: Type 2 diabetes, hyperlipidemia, vitamin D deficiency, morbid obesity.  I have not seen her in some time.  She ran out of [place] refills 2 months ago.  She is feeling well.   Past Medical/Surgical History: Past Medical History:  Diagnosis Date   Gestational diabetes    during pregnancy   Hypertension    during pregnancy   IGT (impaired glucose tolerance)     Past Surgical History:  Procedure Laterality Date   BREAST REDUCTION SURGERY     CESAREAN SECTION     x 3   CESAREAN SECTION N/A 04/27/2016   Procedure: CESAREAN SECTION;  Surgeon: Levie Heritage, DO;  Location: [facility] BIRTHING SUITES;  Service: Obstetrics;  Laterality: N/A;   REDUCTION MAMMAPLASTY Bilateral     Social History:  reports that she has never smoked. She has never used smokeless tobacco. She reports current alcohol use. She reports that she does not use drugs.  Allergies: Allergies  Allergen Reactions   Lisinopril Cough    Family History:  Family History  Problem Relation Age of Onset   Asthma Mother    Colon cancer Father    Hypertension Father    Asthma Sister    Pancreatic cancer Maternal Aunt    Diabetes Maternal Aunt    Hypertension Maternal Aunt      Current Outpatient Medications:    ferrous sulfate 325 (65 FE) MG EC tablet, Take 1 tablet (325 mg total) by mouth daily with breakfast., Disp: 90 tablet, Rfl: 3   tirzepatide (MOUNJARO) 7.5 MG/0.5ML Pen, Inject 7.5 mg into the skin once a week., Disp: 6 mL, Rfl: 0   atorvastatin (LIPITOR) 80 MG tablet, Take 1 tablet (80 mg total) by mouth daily., Disp: 90 tablet, Rfl: 1   valsartan (DIOVAN) 80 MG tablet, Take 1 tablet (80 mg total) by mouth daily., Disp: 90 tablet, Rfl: 1  Review of Systems:   Negative unless indicated in HPI.   Physical Exam: Vitals:   11/23/23 1053  BP: 120/84  Pulse: 64  Temp: 98.4 F (36.9 C)  TempSrc: Oral  SpO2: 99%  Weight: 243 lb 6.4 oz (110.4 kg)    Body mass index is 43.12 kg/m.   Physical Exam Vitals reviewed.  Constitutional:      Appearance: Normal appearance. She is obese.  HENT:     Head: Normocephalic and atraumatic.  Eyes:     Conjunctiva/sclera: Conjunctivae normal.  Cardiovascular:     Rate and Rhythm: Normal rate and regular rhythm.  Pulmonary:     Effort: Pulmonary effort is normal.     Breath sounds: Normal breath sounds.  Skin:    General: Skin is warm and dry.  Neurological:     General: No focal deficit present.     Mental Status: She is alert and oriented to person, place, and time.  Psychiatric:        Mood and Affect: Mood normal.        Behavior: Behavior normal.        Thought Content: Thought content normal.        Judgment: Judgment normal.     Impression and Plan:  Type 2 diabetes mellitus with microalbuminuria, without long-term current use of insulin (HCC) -  POCT glycosylated hemoglobin (Hb A1C) -     Comprehensive metabolic panel; Future  Hyperlipidemia associated with type 2 diabetes mellitus (HCC) Assessment & Plan: Check lipids today, on atorvastatin 80 mg.  Orders: -     Atorvastatin Calcium; Take 1 tablet (80 mg total) by mouth daily.  Dispense: 90 tablet; Refill: 1 -     Lipid panel -     Lipid panel; Future  Microalbuminuria due to type 2 diabetes mellitus [facility]) Assessment & Plan: On ARB.  Orders: -     Valsartan; Take 1 tablet (80 mg total) by mouth daily.  Dispense: 90 tablet; Refill: 1  Vitamin D deficiency -     VITAMIN D 25 Hydroxy (Vit-D Deficiency, Fractures)  Morbid obesity (HCC) Assessment & Plan: -Discussed healthy lifestyle, including increased physical activity and better food choices to promote weight loss.    Iron deficiency anemia, unspecified iron  deficiency anemia type Assessment & Plan: Check CBC and iron studies today, for now continue iron supplementation.  Orders: -     CBC with Differential/Platelet; Future -     IBC + Ferritin; Future  Type 2 diabetes mellitus with diabetic microalbuminuria, without long-term current use of insulin (HCC) Assessment & Plan: A1c is increased to 8.1, not surprising as she has been off Mounjaro for 2 months.  Send medication.  Return in 3 months for follow-up.      Time spent:30 minutes reviewing chart, interviewing and examining patient and formulating plan of care.     Chaya Jan, MD [facility]

## 2023-11-23 NOTE — Assessment & Plan Note (Signed)
 Check lipids today, on atorvastatin 80 mg.

## 2023-11-24 ENCOUNTER — Encounter: Payer: Self-pay | Admitting: Internal Medicine

## 2023-11-24 ENCOUNTER — Telehealth: Payer: Self-pay | Admitting: Pharmacy Technician

## 2023-11-24 ENCOUNTER — Other Ambulatory Visit (HOSPITAL_COMMUNITY): Payer: Self-pay

## 2023-11-24 ENCOUNTER — Other Ambulatory Visit: Payer: Self-pay | Admitting: Internal Medicine

## 2023-11-24 DIAGNOSIS — E559 Vitamin D deficiency, unspecified: Secondary | ICD-10-CM

## 2023-11-24 MED ORDER — VITAMIN D (ERGOCALCIFEROL) 1.25 MG (50000 UNIT) PO CAPS: 50000.0000 [IU] | ORAL_CAPSULE | ORAL | 0 refills | Status: AC

## 2023-11-24 NOTE — Telephone Encounter (Signed)
 Pharmacy Patient Advocate Encounter   Received notification from CoverMyMeds that prior authorization for Mounjaro 7.5mg  is required/requested.   Insurance verification completed.   The patient is insured through [employer] .   Per test claim: PA required; PA submitted to above mentioned insurance via Fax.  Fax number [phone] Status is pending

## 2023-11-25 ENCOUNTER — Other Ambulatory Visit (HOSPITAL_COMMUNITY): Payer: Self-pay

## 2023-11-28 ENCOUNTER — Other Ambulatory Visit (HOSPITAL_COMMUNITY): Payer: Self-pay

## 2023-11-28 ENCOUNTER — Telehealth: Payer: Self-pay

## 2023-11-28 NOTE — Telephone Encounter (Signed)
 Copied from CRM [id number]. Topic: Clinical - Prescription Issue >> Nov 28, 2023 10:52 AM Drema Balzarine wrote: Reason for [id number] says [facility] has sent the office a prior authorization for [facility] and would like it filled asap, requested a call back

## 2023-11-28 NOTE — Telephone Encounter (Signed)
 PA request has been Submitted. New Encounter has been or will be created for follow up. For additional info see Pharmacy Prior Auth telephone encounter from 11/24/23.

## 2023-11-29 ENCOUNTER — Other Ambulatory Visit (HOSPITAL_COMMUNITY): Payer: Self-pay

## 2023-11-30 ENCOUNTER — Other Ambulatory Visit (HOSPITAL_COMMUNITY): Payer: Self-pay

## 2023-12-01 ENCOUNTER — Other Ambulatory Visit (HOSPITAL_COMMUNITY): Payer: Self-pay

## 2023-12-02 ENCOUNTER — Other Ambulatory Visit (HOSPITAL_COMMUNITY): Payer: Self-pay

## 2023-12-02 NOTE — Telephone Encounter (Signed)
**  [facility] does not submit the Pas for this medication. PA has been resent.**  Pharmacy Patient Advocate Encounter   Received notification from CoverMyMeds that prior authorization for MOUNJARO 7.5 MG/0.5 ML PEN is required/requested.   Insurance verification completed.   The patient is insured through [employer] .   Per test claim: PA required; PA submitted to above mentioned insurance via Prompt PA Key/confirmation #/EOC [id number] Status is pending

## 2023-12-05 ENCOUNTER — Other Ambulatory Visit (HOSPITAL_COMMUNITY): Payer: Self-pay

## 2023-12-05 NOTE — Telephone Encounter (Signed)
 Pharmacy Patient Advocate Encounter  Received notification from [facility] that Prior Authorization for MOUNJARO 7.5 MG/0.5 ML PEN  has been APPROVED from 12/04/2023 to 11/30/2024. Ran test claim, Copay is $35.00. This test claim was processed through [facility]- copay amounts may vary at other pharmacies due to pharmacy/plan contracts, or as the patient moves through the different stages of their insurance plan.   PA #/Case ID/Reference #: [id number]

## 2023-12-06 ENCOUNTER — Other Ambulatory Visit (HOSPITAL_COMMUNITY): Payer: Self-pay

## 2024-02-14 ENCOUNTER — Other Ambulatory Visit: Payer: Self-pay | Admitting: Internal Medicine

## 2024-02-14 DIAGNOSIS — E559 Vitamin D deficiency, unspecified: Secondary | ICD-10-CM

## 2024-03-05 ENCOUNTER — Ambulatory Visit: Admitting: Internal Medicine

## 2024-03-05 DIAGNOSIS — R809 Proteinuria, unspecified: Secondary | ICD-10-CM

## 2024-03-15 ENCOUNTER — Encounter: Payer: Self-pay | Admitting: Internal Medicine

## 2024-03-15 ENCOUNTER — Ambulatory Visit: Admitting: Internal Medicine

## 2024-03-15 VITALS — BP 110/80 | HR 98 | Temp 98.4°F | Wt 245.3 lb

## 2024-03-15 DIAGNOSIS — E1129 Type 2 diabetes mellitus with other diabetic kidney complication: Secondary | ICD-10-CM | POA: Diagnosis not present

## 2024-03-15 DIAGNOSIS — E1169 Type 2 diabetes mellitus with other specified complication: Secondary | ICD-10-CM | POA: Diagnosis not present

## 2024-03-15 DIAGNOSIS — R809 Proteinuria, unspecified: Secondary | ICD-10-CM | POA: Diagnosis not present

## 2024-03-15 DIAGNOSIS — E785 Hyperlipidemia, unspecified: Secondary | ICD-10-CM

## 2024-03-15 DIAGNOSIS — Z7985 Long-term (current) use of injectable non-insulin antidiabetic drugs: Secondary | ICD-10-CM

## 2024-03-15 LAB — MICROALBUMIN / CREATININE URINE RATIO
Creatinine,U: 212.4 mg/dL
Microalb Creat Ratio: 26.5 mg/g (ref 0.0–30.0)
Microalb, Ur: 5.6 mg/dL — ABNORMAL HIGH (ref 0.0–1.9)

## 2024-03-15 LAB — POCT GLYCOSYLATED HEMOGLOBIN (HGB A1C): Hemoglobin A1C: 8.2 % — AB (ref 4.0–5.6)

## 2024-03-15 MED ORDER — ATORVASTATIN CALCIUM 80 MG PO TABS: 80.0000 mg | ORAL_TABLET | Freq: Every day | ORAL | 1 refills | Status: DC

## 2024-03-15 MED ORDER — VALSARTAN 80 MG PO TABS: 80.0000 mg | ORAL_TABLET | Freq: Every day | ORAL | 1 refills | Status: DC

## 2024-03-15 NOTE — Assessment & Plan Note (Signed)
 Not well-controlled with an A1c of 8.2.  Has not been taking Mounjaro .  She will resume and return in 3 months for follow-up.

## 2024-03-15 NOTE — Assessment & Plan Note (Signed)
-  Discussed healthy lifestyle, including increased physical activity and better food choices to promote weight loss. - Hopefully Mounjaro  will help.

## 2024-03-15 NOTE — Assessment & Plan Note (Signed)
 Lipids are not at goal.  She has not been taking atorvastatin .  She will resume and return in 3 months for recheck.

## 2024-03-15 NOTE — Progress Notes (Signed)
 Established Patient Office Visit     CC/Reason for Visit: Follow-up chronic conditions  HPI: Ashley Pugh is a 43 y.o. female who is coming in today for the above mentioned reasons. Past Medical History is significant for: Morbid obesity, hypertension, hyperlipidemia, type 2 diabetes with microalbuminuria.  She has not been compliant with her statin or Mounjaro .  She is feeling well.   Past Medical/Surgical History: Past Medical History:  Diagnosis Date   Gestational diabetes    during pregnancy   Hypertension    during pregnancy   IGT (impaired glucose tolerance)     Past Surgical History:  Procedure Laterality Date   BREAST REDUCTION SURGERY     CESAREAN SECTION     x 3   CESAREAN SECTION N/A 04/27/2016   Procedure: CESAREAN SECTION;  Surgeon: Lang JINNY Peel, DO;  Location: [facility] BIRTHING SUITES;  Service: Obstetrics;  Laterality: N/A;   REDUCTION MAMMAPLASTY Bilateral     Social History:  reports that she has never smoked. She has never used smokeless tobacco. She reports current alcohol use. She reports that she does not use drugs.  Allergies: Allergies  Allergen Reactions   Lisinopril  Cough    Family History:  Family History  Problem Relation Age of Onset   Asthma Mother    Colon cancer Father    Hypertension Father    Asthma Sister    Pancreatic cancer Maternal Aunt    Diabetes Maternal Aunt    Hypertension Maternal Aunt      Current Outpatient Medications:    ferrous sulfate  325 (65 FE) MG EC tablet, Take 1 tablet (325 mg total) by mouth daily with breakfast., Disp: 90 tablet, Rfl: 3   tirzepatide  (MOUNJARO ) 7.5 MG/0.5ML Pen, Inject 7.5 mg into the skin once a week., Disp: 6 mL, Rfl: 0   atorvastatin  (LIPITOR) 80 MG tablet, Take 1 tablet (80 mg total) by mouth daily., Disp: 90 tablet, Rfl: 1   valsartan  (DIOVAN ) 80 MG tablet, Take 1 tablet (80 mg total) by mouth daily., Disp: 90 tablet, Rfl: 1  Review of Systems:  Negative unless  indicated in HPI.   Physical Exam: Vitals:   03/15/24 1356  BP: 110/80  Pulse: 98  Temp: 98.4 F (36.9 C)  TempSrc: Oral  SpO2: 100%  Weight: 245 lb 4.8 oz (111.3 kg)    Body mass index is 43.45 kg/m.   Physical Exam   Impression and Plan:  Type 2 diabetes mellitus with microalbuminuria, without long-term current use of insulin (HCC) -     Microalbumin / creatinine urine ratio -     POCT glycosylated hemoglobin (Hb A1C)  Hyperlipidemia associated with type 2 diabetes mellitus (HCC) Assessment & Plan: Lipids are not at goal.  She has not been taking atorvastatin .  She will resume and return in 3 months for recheck.  Orders: -     Atorvastatin  Calcium ; Take 1 tablet (80 mg total) by mouth daily.  Dispense: 90 tablet; Refill: 1  Microalbuminuria due to type 2 diabetes mellitus (HCC) Assessment & Plan: On ARB, recheck microalbumin today.  Orders: -     Valsartan ; Take 1 tablet (80 mg total) by mouth daily.  Dispense: 90 tablet; Refill: 1  Morbid obesity (HCC) Assessment & Plan: -Discussed healthy lifestyle, including increased physical activity and better food choices to promote weight loss. - Hopefully Mounjaro  will help.   Type 2 diabetes mellitus with diabetic microalbuminuria, without long-term current use of insulin [facility] Assessment & Plan: Not  well-controlled with an A1c of 8.2.  Has not been taking Mounjaro .  She will resume and return in 3 months for follow-up.      Time spent:31 minutes reviewing chart, interviewing and examining patient and formulating plan of care.     Tully Theophilus Andrews, MD  Primary Care at [facility]

## 2024-03-15 NOTE — Assessment & Plan Note (Signed)
 On ARB, recheck microalbumin today.

## 2024-03-15 NOTE — Progress Notes (Signed)
 [facility] Quality Team Note  Name: Ashley Pugh Date of Birth: Nov 02, 1980 MRN: [id number] Date: 03/15/2024  [facility] Quality Team has reviewed this patient's chart, please see recommendations below:  [facility] Quality Other; (Chart reviewed. Last A1c out of range, new result pending. Patient also needs urine microalbumin/creatinine ratio for gap closure.)

## 2024-03-20 ENCOUNTER — Ambulatory Visit: Payer: Self-pay | Admitting: Internal Medicine

## 2024-03-24 ENCOUNTER — Other Ambulatory Visit: Payer: Self-pay | Admitting: Internal Medicine

## 2024-03-24 DIAGNOSIS — E559 Vitamin D deficiency, unspecified: Secondary | ICD-10-CM

## 2024-04-09 ENCOUNTER — Telehealth: Payer: Self-pay | Admitting: *Deleted

## 2024-04-09 NOTE — Telephone Encounter (Signed)
 Patient was identified as falling into the [place] Measure - Diabetes.   Patient was: Left voicemail to schedule with primary care provider.

## 2024-04-19 ENCOUNTER — Other Ambulatory Visit (HOSPITAL_COMMUNITY): Payer: Self-pay

## 2024-04-19 ENCOUNTER — Other Ambulatory Visit: Payer: Self-pay | Admitting: Internal Medicine

## 2024-04-19 DIAGNOSIS — R809 Proteinuria, unspecified: Secondary | ICD-10-CM

## 2024-04-19 MED ORDER — MOUNJARO 7.5 MG/0.5ML ~~LOC~~ SOAJ
7.5000 mg | SUBCUTANEOUS | 0 refills | Status: DC
  Filled 2024-04-19 – 2024-04-30 (×2): qty 2, 28d supply, fill #0

## 2024-04-28 ENCOUNTER — Other Ambulatory Visit (HOSPITAL_COMMUNITY): Payer: Self-pay

## 2024-04-30 ENCOUNTER — Other Ambulatory Visit (HOSPITAL_COMMUNITY): Payer: Self-pay

## 2024-07-03 ENCOUNTER — Telehealth: Payer: Self-pay

## 2024-07-03 NOTE — Telephone Encounter (Signed)
 Patient was identified as falling into the [place] Measure - Diabetes.   Patient was: Appointment already scheduled for:  07/04/2024 with Dr. Theophilus.

## 2024-07-04 ENCOUNTER — Ambulatory Visit: Admitting: Internal Medicine

## 2024-07-09 ENCOUNTER — Other Ambulatory Visit (HOSPITAL_COMMUNITY): Payer: Self-pay

## 2024-07-09 ENCOUNTER — Encounter: Payer: Self-pay | Admitting: Internal Medicine

## 2024-07-09 ENCOUNTER — Ambulatory Visit: Admitting: Internal Medicine

## 2024-07-09 DIAGNOSIS — D509 Iron deficiency anemia, unspecified: Secondary | ICD-10-CM | POA: Diagnosis not present

## 2024-07-09 DIAGNOSIS — I152 Hypertension secondary to endocrine disorders: Secondary | ICD-10-CM | POA: Insufficient documentation

## 2024-07-09 DIAGNOSIS — Z7985 Long-term (current) use of injectable non-insulin antidiabetic drugs: Secondary | ICD-10-CM

## 2024-07-09 DIAGNOSIS — E1159 Type 2 diabetes mellitus with other circulatory complications: Secondary | ICD-10-CM

## 2024-07-09 DIAGNOSIS — R809 Proteinuria, unspecified: Secondary | ICD-10-CM

## 2024-07-09 DIAGNOSIS — Z7984 Long term (current) use of oral hypoglycemic drugs: Secondary | ICD-10-CM

## 2024-07-09 DIAGNOSIS — E785 Hyperlipidemia, unspecified: Secondary | ICD-10-CM

## 2024-07-09 DIAGNOSIS — B372 Candidiasis of skin and nail: Secondary | ICD-10-CM

## 2024-07-09 DIAGNOSIS — E1129 Type 2 diabetes mellitus with other diabetic kidney complication: Secondary | ICD-10-CM

## 2024-07-09 DIAGNOSIS — E1169 Type 2 diabetes mellitus with other specified complication: Secondary | ICD-10-CM | POA: Diagnosis not present

## 2024-07-09 DIAGNOSIS — E559 Vitamin D deficiency, unspecified: Secondary | ICD-10-CM

## 2024-07-09 LAB — POCT GLYCOSYLATED HEMOGLOBIN (HGB A1C): Hemoglobin A1C: 7.7 % — AB (ref 4.0–5.6)

## 2024-07-09 MED ORDER — NYSTATIN 100000 UNIT/GM EX POWD
1.0000 | Freq: Three times a day (TID) | CUTANEOUS | 3 refills | Status: AC
  Filled 2024-07-09: qty 60, 20d supply, fill #0

## 2024-07-09 MED ORDER — VALSARTAN 80 MG PO TABS
80.0000 mg | ORAL_TABLET | Freq: Every day | ORAL | 1 refills | Status: AC
  Filled 2024-07-09 – 2024-08-14 (×3): qty 30, 30d supply, fill #0

## 2024-07-09 MED ORDER — ATORVASTATIN CALCIUM 80 MG PO TABS
80.0000 mg | ORAL_TABLET | Freq: Every day | ORAL | 1 refills | Status: AC
  Filled 2024-07-09 – 2024-08-14 (×2): qty 30, 30d supply, fill #0

## 2024-07-09 MED ORDER — TIRZEPATIDE 10 MG/0.5ML ~~LOC~~ SOAJ
10.0000 mg | SUBCUTANEOUS | 0 refills | Status: AC
  Filled 2024-07-09: qty 2, 28d supply, fill #0
  Filled 2024-08-14: qty 2, 28d supply, fill #1
  Filled 2024-10-04 – 2024-10-10 (×2): qty 2, 28d supply, fill #2

## 2024-07-09 MED ORDER — EMPAGLIFLOZIN 10 MG PO TABS
10.0000 mg | ORAL_TABLET | Freq: Every day | ORAL | 1 refills | Status: AC
  Filled 2024-07-09 – 2024-07-16 (×2): qty 30, 30d supply, fill #0

## 2024-07-09 NOTE — Assessment & Plan Note (Signed)
 A1c improving, down to 7.7.  Increase Mounjaro  to 10 mg and add Jardiance due to microalbuminuria.  Will continue to work on lifestyle changes, she will add MiraLAX for her constipation.

## 2024-07-09 NOTE — Assessment & Plan Note (Signed)
 Well-controlled on current.

## 2024-07-09 NOTE — Progress Notes (Signed)
 Established Patient Office Visit     CC/Reason for Visit: Follow-up chronic conditions  HPI: Ashley Pugh is a 43 y.o. female who is coming in today for the above mentioned reasons. Past Medical History is significant for: Hypertension, hyperlipidemia, type 2 diabetes with microalbuminuria, morbid obesity.  She has been having an itchy, wet rash underneath her breasts bilaterally.  She is having significant constipation caused by Mounjaro .   Past Medical/Surgical History: Past Medical History:  Diagnosis Date   Gestational diabetes    during pregnancy   Hypertension    during pregnancy   IGT (impaired glucose tolerance)     Past Surgical History:  Procedure Laterality Date   BREAST REDUCTION SURGERY     CESAREAN SECTION     x 3   CESAREAN SECTION N/A 04/27/2016   Procedure: CESAREAN SECTION;  Surgeon: Lang JINNY Peel, DO;  Location: [facility] BIRTHING SUITES;  Service: Obstetrics;  Laterality: N/A;   REDUCTION MAMMAPLASTY Bilateral     Social History:  reports that she has never smoked. She has never used smokeless tobacco. She reports current alcohol use. She reports that she does not use drugs.  Allergies: Allergies  Allergen Reactions   Lisinopril  Cough    Family History:  Family History  Problem Relation Age of Onset   Asthma Mother    Colon cancer Father    Hypertension Father    Asthma Sister    Pancreatic cancer Maternal Aunt    Diabetes Maternal Aunt    Hypertension Maternal Aunt      Current Outpatient Medications:    empagliflozin (JARDIANCE) 10 MG TABS tablet, Take 1 tablet (10 mg total) by mouth daily., Disp: 90 tablet, Rfl: 1   ferrous sulfate  325 (65 FE) MG EC tablet, Take 1 tablet (325 mg total) by mouth daily with breakfast., Disp: 90 tablet, Rfl: 3   nystatin (MYCOSTATIN/NYSTOP) powder, Apply 1 Application topically 3 (three) times daily., Disp: 60 g, Rfl: 3   tirzepatide  (MOUNJARO ) 10 MG/0.5ML Pen, Inject 10 mg into the skin once  a week., Disp: 6 mL, Rfl: 0   atorvastatin  (LIPITOR) 80 MG tablet, Take 1 tablet (80 mg total) by mouth daily., Disp: 90 tablet, Rfl: 1   valsartan  (DIOVAN ) 80 MG tablet, Take 1 tablet (80 mg total) by mouth daily., Disp: 90 tablet, Rfl: 1  Review of Systems:  Negative unless indicated in HPI.   Physical Exam: Vitals:   07/09/24 1455  BP: 102/68  Pulse: 95  Temp: 98.6 F (37 C)  TempSrc: Oral  SpO2: 98%  Weight: 241 lb (109.3 kg)    Body mass index is 42.69 kg/m.   Physical Exam Vitals reviewed.  Constitutional:      Appearance: Normal appearance. She is obese.  HENT:     Head: Normocephalic and atraumatic.  Eyes:     Conjunctiva/sclera: Conjunctivae normal.  Cardiovascular:     Rate and Rhythm: Normal rate and regular rhythm.  Pulmonary:     Effort: Pulmonary effort is normal.     Breath sounds: Normal breath sounds.  Skin:    General: Skin is warm and dry.  Neurological:     General: No focal deficit present.     Mental Status: She is alert and oriented to person, place, and time.  Psychiatric:        Mood and Affect: Mood normal.        Behavior: Behavior normal.        Thought Content: Thought content  normal.        Judgment: Judgment normal.      Impression and Plan:  Morbid obesity (HCC) Assessment & Plan: -Discussed healthy lifestyle, including increased physical activity and better food choices to promote weight loss.  Increase Mounjaro  dose.    Type 2 diabetes mellitus with diabetic microalbuminuria, without long-term current use of insulin (HCC) Assessment & Plan: A1c improving, down to 7.7.  Increase Mounjaro  to 10 mg and add Jardiance due to microalbuminuria.  Will continue to work on lifestyle changes, she will add MiraLAX for her constipation.  Orders: -     POCT glycosylated hemoglobin (Hb A1C) -     Microalbumin / creatinine urine ratio; Future -     Lipid panel; Future -     Comprehensive metabolic panel with GFR; Future -      Empagliflozin; Take 1 tablet (10 mg total) by mouth daily.  Dispense: 90 tablet; Refill: 1 -     Tirzepatide ; Inject 10 mg into the skin once a week.  Dispense: 6 mL; Refill: 0  Hyperlipidemia associated with type 2 diabetes mellitus (HCC) Assessment & Plan: Check lipids, goal LDL less than 70.  Orders: -     Atorvastatin  Calcium ; Take 1 tablet (80 mg total) by mouth daily.  Dispense: 90 tablet; Refill: 1  Microalbuminuria due to type 2 diabetes mellitus (HCC) Assessment & Plan: Add Jardiance, continue valsartan .  Orders: -     Valsartan ; Take 1 tablet (80 mg total) by mouth daily.  Dispense: 90 tablet; Refill: 1  Hypertension associated with diabetes [facility]) Assessment & Plan: Well-controlled on current.   Vitamin D  deficiency Assessment & Plan: Check levels today.  Orders: -     VITAMIN D  25 Hydroxy (Vit-D Deficiency, Fractures)  Iron deficiency anemia, unspecified iron deficiency anemia type Assessment & Plan: Check hemoglobin and iron studies today.  Orders: -     IBC + Ferritin -     CBC with Differential/Platelet  Yeast dermatitis -     Nystatin; Apply 1 Application topically 3 (three) times daily.  Dispense: 60 g; Refill: 3     Time spent:33 minutes reviewing chart, interviewing and examining patient and formulating plan of care.     Tully Theophilus Andrews, MD [facility] at [facility]

## 2024-07-09 NOTE — Assessment & Plan Note (Signed)
Check lipids, goal LDL less than 70 

## 2024-07-09 NOTE — Assessment & Plan Note (Signed)
-  Discussed healthy lifestyle, including increased physical activity and better food choices to promote weight loss.  Increase Mounjaro  dose.

## 2024-07-09 NOTE — Assessment & Plan Note (Signed)
 Check levels today.

## 2024-07-09 NOTE — Assessment & Plan Note (Signed)
 Check hemoglobin and iron studies today.

## 2024-07-09 NOTE — Assessment & Plan Note (Signed)
 Add Jardiance, continue valsartan .

## 2024-07-10 ENCOUNTER — Other Ambulatory Visit: Payer: Self-pay

## 2024-07-10 DIAGNOSIS — R809 Proteinuria, unspecified: Secondary | ICD-10-CM

## 2024-07-10 LAB — COMPREHENSIVE METABOLIC PANEL WITH GFR
ALT: 13 U/L (ref 0–35)
AST: 12 U/L (ref 0–37)
Albumin: 4.1 g/dL (ref 3.5–5.2)
Alkaline Phosphatase: 117 U/L (ref 39–117)
BUN: 10 mg/dL (ref 6–23)
CO2: 25 meq/L (ref 19–32)
Calcium: 9.3 mg/dL (ref 8.4–10.5)
Chloride: 104 meq/L (ref 96–112)
Creatinine, Ser: 0.64 mg/dL (ref 0.40–1.20)
GFR: 107.13 mL/min (ref >60.00)
Glucose, Bld: 198 mg/dL — ABNORMAL HIGH (ref 70–99)
Potassium: 3.9 meq/L (ref 3.5–5.1)
Sodium: 137 meq/L (ref 135–145)
Total Bilirubin: 0.4 mg/dL (ref 0.2–1.2)
Total Protein: 7.7 g/dL (ref 6.0–8.3)

## 2024-07-10 LAB — CBC WITH DIFFERENTIAL/PLATELET
Basophils Absolute: 0 K/uL (ref 0.0–0.1)
Basophils Relative: 0.5 % (ref 0.0–3.0)
Eosinophils Absolute: 0.1 K/uL (ref 0.0–0.7)
Eosinophils Relative: 0.9 % (ref 0.0–5.0)
HCT: 28.5 % — ABNORMAL LOW (ref 36.0–46.0)
Hemoglobin: 8.5 g/dL — ABNORMAL LOW (ref 12.0–15.0)
Lymphocytes Relative: 23.1 % (ref 12.0–46.0)
Lymphs Abs: 1.6 K/uL (ref 0.7–4.0)
MCHC: 29.6 g/dL — ABNORMAL LOW (ref 30.0–36.0)
MCV: 69 fl — ABNORMAL LOW (ref 78.0–100.0)
Monocytes Absolute: 0.5 K/uL (ref 0.1–1.0)
Monocytes Relative: 6.7 % (ref 3.0–12.0)
Neutro Abs: 4.7 K/uL (ref 1.4–7.7)
Neutrophils Relative %: 68.8 % (ref 43.0–77.0)
Platelets: 302 K/uL (ref 150.0–400.0)
RBC: 4.13 Mil/uL (ref 3.87–5.11)
RDW: 18.6 % — ABNORMAL HIGH (ref 11.5–15.5)
WBC: 6.8 K/uL (ref 4.0–10.5)

## 2024-07-10 LAB — LIPID PANEL
Cholesterol: 112 mg/dL (ref 0–200)
HDL: 35.8 mg/dL — ABNORMAL LOW (ref >39.00)
LDL Cholesterol: 50 mg/dL (ref 0–99)
NonHDL: 76.2
Total CHOL/HDL Ratio: 3
Triglycerides: 131 mg/dL (ref 0.0–149.0)
VLDL: 26.2 mg/dL (ref 0.0–40.0)

## 2024-07-10 LAB — IBC + FERRITIN
Ferritin: 7.9 ng/mL — ABNORMAL LOW (ref 10.0–291.0)
Iron: 15 ug/dL — ABNORMAL LOW (ref 42–145)
Saturation Ratios: 3.2 % — ABNORMAL LOW (ref 20.0–50.0)
TIBC: 464.8 ug/dL — ABNORMAL HIGH (ref 250.0–450.0)
Transferrin: 332 mg/dL (ref 212.0–360.0)

## 2024-07-10 LAB — MICROALBUMIN / CREATININE URINE RATIO
Creatinine,U: 98.8 mg/dL
Microalb Creat Ratio: 11.7 mg/g (ref 0.0–30.0)
Microalb, Ur: 1.2 mg/dL (ref 0.0–1.9)

## 2024-07-10 LAB — VITAMIN D 25 HYDROXY (VIT D DEFICIENCY, FRACTURES): VITD: 17.74 ng/mL — ABNORMAL LOW (ref 30.00–100.00)

## 2024-07-10 NOTE — Progress Notes (Signed)
   07/10/2024  Patient ID: Ashley Pugh, female   DOB: 06-Feb-1981, 43 y.o.   MRN: [id number]  Pharmacy Quality Measure Review  This patient is appearing on a report for being at risk of failing the Glycemic Status Assessment in Diabetes measure this calendar year.    Last documented A1c 7.7 on 07/10/24  A1C < 8, no further action needed at this time.  Jon VEAR Lindau, PharmD Clinical Pharmacist [phone]

## 2024-07-14 ENCOUNTER — Other Ambulatory Visit (HOSPITAL_COMMUNITY): Payer: Self-pay

## 2024-07-14 ENCOUNTER — Telehealth (HOSPITAL_COMMUNITY): Payer: Self-pay | Admitting: Pharmacy Technician

## 2024-07-16 ENCOUNTER — Other Ambulatory Visit (HOSPITAL_COMMUNITY): Payer: Self-pay

## 2024-07-16 ENCOUNTER — Telehealth (HOSPITAL_COMMUNITY): Payer: Self-pay

## 2024-07-16 NOTE — Telephone Encounter (Signed)
 Pharmacy Patient Advocate Encounter   Received notification from Pt Calls Messages that prior authorization for JARDIANCE 10 MG TABLET is required/requested.   Insurance verification completed.   The patient is insured through [employer].   Per test claim: PA required; PA submitted to above mentioned insurance via Prompt PA Key/confirmation #/[id number] Status is pending

## 2024-07-16 NOTE — Telephone Encounter (Signed)
 PA request has been Received. New Encounter has been or will be created for follow up. For additional info see Pharmacy Prior Auth telephone encounter from 07/16/24.

## 2024-07-17 ENCOUNTER — Other Ambulatory Visit (HOSPITAL_COMMUNITY): Payer: Self-pay

## 2024-07-18 ENCOUNTER — Ambulatory Visit: Payer: Self-pay | Admitting: Internal Medicine

## 2024-07-18 DIAGNOSIS — D509 Iron deficiency anemia, unspecified: Secondary | ICD-10-CM

## 2024-07-18 DIAGNOSIS — E559 Vitamin D deficiency, unspecified: Secondary | ICD-10-CM

## 2024-07-18 MED ORDER — VITAMIN D (ERGOCALCIFEROL) 1.25 MG (50000 UNIT) PO CAPS: 50000.0000 [IU] | ORAL_CAPSULE | ORAL | 0 refills | Status: AC

## 2024-07-19 ENCOUNTER — Other Ambulatory Visit (HOSPITAL_COMMUNITY): Payer: Self-pay

## 2024-07-20 ENCOUNTER — Other Ambulatory Visit (HOSPITAL_COMMUNITY): Payer: Self-pay

## 2024-07-20 NOTE — Telephone Encounter (Signed)
 Pharmacy Patient Advocate Encounter  Received notification from [facility] that Prior Authorization for JARDIANCE 10 MG TABLET  has been APPROVED from 07/16/24 to 07/15/26. Ran test claim, Copay is $10 with a copay card, without the price is $35. This test claim was processed through [facility]- copay amounts may vary at other pharmacies due to pharmacy/plan contracts, or as the patient moves through the different stages of their insurance plan.   PA #/Case ID/Reference #: [id number]

## 2024-07-30 ENCOUNTER — Other Ambulatory Visit (HOSPITAL_COMMUNITY): Payer: Self-pay

## 2024-08-14 ENCOUNTER — Other Ambulatory Visit: Payer: Self-pay

## 2024-08-14 ENCOUNTER — Other Ambulatory Visit (HOSPITAL_COMMUNITY): Payer: Self-pay

## 2024-10-04 ENCOUNTER — Other Ambulatory Visit (HOSPITAL_COMMUNITY): Payer: Self-pay

## 2024-10-08 ENCOUNTER — Encounter: Payer: Self-pay | Admitting: Pharmacist

## 2024-10-08 ENCOUNTER — Other Ambulatory Visit: Payer: Self-pay

## 2024-10-10 ENCOUNTER — Other Ambulatory Visit: Payer: Self-pay

## 2024-10-10 ENCOUNTER — Other Ambulatory Visit (HOSPITAL_COMMUNITY): Payer: Self-pay

## 2024-10-29 ENCOUNTER — Ambulatory Visit: Admitting: Internal Medicine
